# Patient Record
Sex: Female | Born: 1935
Health system: Southern US, Community
[De-identification: ages and names within clinical notes are randomized; demographics above are authoritative.]

## PROBLEM LIST (undated history)

## (undated) DIAGNOSIS — E7439 Other disorders of intestinal carbohydrate absorption: Secondary | ICD-10-CM

## (undated) DIAGNOSIS — Z955 Presence of coronary angioplasty implant and graft: Secondary | ICD-10-CM

## (undated) DIAGNOSIS — E538 Deficiency of other specified B group vitamins: Secondary | ICD-10-CM

## (undated) DIAGNOSIS — I350 Nonrheumatic aortic (valve) stenosis: Secondary | ICD-10-CM

## (undated) DIAGNOSIS — I219 Acute myocardial infarction, unspecified: Secondary | ICD-10-CM

## (undated) DIAGNOSIS — D649 Anemia, unspecified: Secondary | ICD-10-CM

## (undated) DIAGNOSIS — F418 Other specified anxiety disorders: Secondary | ICD-10-CM

## (undated) DIAGNOSIS — I10 Essential (primary) hypertension: Secondary | ICD-10-CM

## (undated) DIAGNOSIS — I639 Cerebral infarction, unspecified: Secondary | ICD-10-CM

## (undated) DIAGNOSIS — K635 Polyp of colon: Secondary | ICD-10-CM

## (undated) DIAGNOSIS — C569 Malignant neoplasm of unspecified ovary: Secondary | ICD-10-CM

## (undated) DIAGNOSIS — M810 Age-related osteoporosis without current pathological fracture: Secondary | ICD-10-CM

## (undated) DIAGNOSIS — I509 Heart failure, unspecified: Secondary | ICD-10-CM

## (undated) DIAGNOSIS — I4891 Unspecified atrial fibrillation: Secondary | ICD-10-CM

## (undated) DIAGNOSIS — Z8709 Personal history of other diseases of the respiratory system: Secondary | ICD-10-CM

## (undated) DIAGNOSIS — F0390 Unspecified dementia without behavioral disturbance: Secondary | ICD-10-CM

## (undated) DIAGNOSIS — G2581 Restless legs syndrome: Secondary | ICD-10-CM

## (undated) HISTORY — DX: Polyp of colon: K63.5

## (undated) HISTORY — PX: BREAST SURGERY: SHX581

## (undated) HISTORY — DX: Cerebral infarction, unspecified: I63.9

## (undated) HISTORY — DX: Malignant neoplasm of unspecified ovary: C56.9

## (undated) HISTORY — PX: CORONARY STENT PLACEMENT: SHX1402

## (undated) HISTORY — DX: Restless legs syndrome: G25.81

## (undated) HISTORY — DX: Essential (primary) hypertension: I10

## (undated) HISTORY — PX: JOINT REPLACEMENT: SHX530

## (undated) HISTORY — DX: Heart failure, unspecified: I50.9

## (undated) HISTORY — DX: Other disorders of intestinal carbohydrate absorption: E74.39

## (undated) HISTORY — DX: Nonrheumatic aortic (valve) stenosis: I35.0

## (undated) HISTORY — DX: Personal history of other diseases of the respiratory system: Z87.09

## (undated) HISTORY — DX: Deficiency of other specified B group vitamins: E53.8

## (undated) HISTORY — PX: EYE SURGERY: SHX253

## (undated) HISTORY — DX: Presence of coronary angioplasty implant and graft: Z95.5

## (undated) HISTORY — PX: BACK SURGERY: SHX140

## (undated) HISTORY — PX: ABDOMINAL HYSTERECTOMY: SHX81

## (undated) HISTORY — PX: HEMORROIDECTOMY: SUR656

## (undated) HISTORY — DX: Age-related osteoporosis without current pathological fracture: M81.0

## (undated) HISTORY — DX: Acute myocardial infarction, unspecified: I21.9

## (undated) HISTORY — PX: CHOLECYSTECTOMY: SHX55

## (undated) HISTORY — DX: Other specified anxiety disorders: F41.8

---

## 2013-09-30 DIAGNOSIS — K635 Polyp of colon: Secondary | ICD-10-CM

## 2013-09-30 HISTORY — DX: Polyp of colon: K63.5

## 2013-10-06 LAB — HM COLONOSCOPY

## 2015-07-01 LAB — HEMOGLOBIN A1C: Hemoglobin A1C: 5.8

## 2015-07-01 LAB — LIPID PANEL
CHOLESTEROL: 227 — AB (ref 0–200)
HDL: 49 (ref 35–70)
LDL CALC: 150
Triglycerides: 139 (ref 40–160)

## 2016-04-20 LAB — HM DEXA SCAN

## 2016-07-21 LAB — IRON,TIBC AND FERRITIN PANEL
Ferritin: 52
IRON: 76
TIBC: 307

## 2016-07-21 LAB — CBC AND DIFFERENTIAL
HEMATOCRIT: 29 — AB (ref 36–46)
HEMOGLOBIN: 9.9 — AB (ref 12.0–16.0)
PLATELETS: 165 (ref 150–399)

## 2016-07-21 LAB — BASIC METABOLIC PANEL
BUN: 19 (ref 4–21)
Creatinine: 1.3 — AB (ref 0.5–1.1)
GLUCOSE: 80
Potassium: 4 (ref 3.4–5.3)
Sodium: 137 (ref 137–147)

## 2016-11-01 DIAGNOSIS — G309 Alzheimer's disease, unspecified: Secondary | ICD-10-CM | POA: Diagnosis not present

## 2016-11-01 DIAGNOSIS — I482 Chronic atrial fibrillation: Secondary | ICD-10-CM | POA: Diagnosis not present

## 2016-11-01 DIAGNOSIS — I10 Essential (primary) hypertension: Secondary | ICD-10-CM | POA: Diagnosis not present

## 2016-11-21 DIAGNOSIS — I482 Chronic atrial fibrillation: Secondary | ICD-10-CM | POA: Diagnosis not present

## 2016-11-21 DIAGNOSIS — Z7901 Long term (current) use of anticoagulants: Secondary | ICD-10-CM | POA: Diagnosis not present

## 2016-12-01 DIAGNOSIS — G309 Alzheimer's disease, unspecified: Secondary | ICD-10-CM | POA: Diagnosis not present

## 2016-12-01 DIAGNOSIS — I10 Essential (primary) hypertension: Secondary | ICD-10-CM | POA: Diagnosis not present

## 2016-12-01 DIAGNOSIS — I482 Chronic atrial fibrillation: Secondary | ICD-10-CM | POA: Diagnosis not present

## 2016-12-26 DIAGNOSIS — I482 Chronic atrial fibrillation: Secondary | ICD-10-CM | POA: Diagnosis not present

## 2016-12-26 DIAGNOSIS — Z7901 Long term (current) use of anticoagulants: Secondary | ICD-10-CM | POA: Diagnosis not present

## 2017-01-04 DIAGNOSIS — I482 Chronic atrial fibrillation: Secondary | ICD-10-CM | POA: Diagnosis not present

## 2017-01-04 DIAGNOSIS — I10 Essential (primary) hypertension: Secondary | ICD-10-CM | POA: Diagnosis not present

## 2017-01-04 DIAGNOSIS — G309 Alzheimer's disease, unspecified: Secondary | ICD-10-CM | POA: Diagnosis not present

## 2017-01-25 DIAGNOSIS — M5137 Other intervertebral disc degeneration, lumbosacral region: Secondary | ICD-10-CM | POA: Diagnosis not present

## 2017-01-25 DIAGNOSIS — K219 Gastro-esophageal reflux disease without esophagitis: Secondary | ICD-10-CM | POA: Diagnosis not present

## 2017-01-25 DIAGNOSIS — Z7901 Long term (current) use of anticoagulants: Secondary | ICD-10-CM | POA: Diagnosis not present

## 2017-01-25 DIAGNOSIS — F321 Major depressive disorder, single episode, moderate: Secondary | ICD-10-CM | POA: Diagnosis not present

## 2017-01-25 DIAGNOSIS — I10 Essential (primary) hypertension: Secondary | ICD-10-CM | POA: Diagnosis not present

## 2017-01-25 DIAGNOSIS — G2581 Restless legs syndrome: Secondary | ICD-10-CM | POA: Diagnosis not present

## 2017-02-23 DIAGNOSIS — I482 Chronic atrial fibrillation: Secondary | ICD-10-CM | POA: Diagnosis not present

## 2017-02-23 DIAGNOSIS — F321 Major depressive disorder, single episode, moderate: Secondary | ICD-10-CM | POA: Diagnosis not present

## 2017-02-23 DIAGNOSIS — Z7901 Long term (current) use of anticoagulants: Secondary | ICD-10-CM | POA: Diagnosis not present

## 2017-02-28 DIAGNOSIS — I482 Chronic atrial fibrillation: Secondary | ICD-10-CM | POA: Diagnosis not present

## 2017-02-28 DIAGNOSIS — F321 Major depressive disorder, single episode, moderate: Secondary | ICD-10-CM | POA: Diagnosis not present

## 2017-02-28 DIAGNOSIS — G309 Alzheimer's disease, unspecified: Secondary | ICD-10-CM | POA: Diagnosis not present

## 2017-02-28 DIAGNOSIS — I10 Essential (primary) hypertension: Secondary | ICD-10-CM | POA: Diagnosis not present

## 2017-03-09 DIAGNOSIS — I482 Chronic atrial fibrillation: Secondary | ICD-10-CM | POA: Diagnosis not present

## 2017-03-09 DIAGNOSIS — Z7901 Long term (current) use of anticoagulants: Secondary | ICD-10-CM | POA: Diagnosis not present

## 2017-03-28 DIAGNOSIS — Z7901 Long term (current) use of anticoagulants: Secondary | ICD-10-CM | POA: Diagnosis not present

## 2017-03-28 DIAGNOSIS — I482 Chronic atrial fibrillation: Secondary | ICD-10-CM | POA: Diagnosis not present

## 2017-04-04 DIAGNOSIS — G2581 Restless legs syndrome: Secondary | ICD-10-CM | POA: Diagnosis not present

## 2017-04-04 DIAGNOSIS — M79604 Pain in right leg: Secondary | ICD-10-CM | POA: Diagnosis not present

## 2017-04-04 DIAGNOSIS — F321 Major depressive disorder, single episode, moderate: Secondary | ICD-10-CM | POA: Diagnosis not present

## 2017-04-04 DIAGNOSIS — R358 Other polyuria: Secondary | ICD-10-CM | POA: Diagnosis not present

## 2017-04-04 DIAGNOSIS — M79605 Pain in left leg: Secondary | ICD-10-CM | POA: Diagnosis not present

## 2017-04-04 DIAGNOSIS — N3000 Acute cystitis without hematuria: Secondary | ICD-10-CM | POA: Diagnosis not present

## 2017-04-05 DIAGNOSIS — I10 Essential (primary) hypertension: Secondary | ICD-10-CM | POA: Diagnosis not present

## 2017-04-05 DIAGNOSIS — G309 Alzheimer's disease, unspecified: Secondary | ICD-10-CM | POA: Diagnosis not present

## 2017-04-05 DIAGNOSIS — F321 Major depressive disorder, single episode, moderate: Secondary | ICD-10-CM | POA: Diagnosis not present

## 2017-04-05 DIAGNOSIS — I482 Chronic atrial fibrillation: Secondary | ICD-10-CM | POA: Diagnosis not present

## 2017-04-12 DIAGNOSIS — I482 Chronic atrial fibrillation: Secondary | ICD-10-CM | POA: Diagnosis not present

## 2017-04-12 DIAGNOSIS — Z7901 Long term (current) use of anticoagulants: Secondary | ICD-10-CM | POA: Diagnosis not present

## 2017-04-26 DIAGNOSIS — I482 Chronic atrial fibrillation: Secondary | ICD-10-CM | POA: Diagnosis not present

## 2017-04-26 DIAGNOSIS — Z7901 Long term (current) use of anticoagulants: Secondary | ICD-10-CM | POA: Diagnosis not present

## 2017-04-28 DIAGNOSIS — M79661 Pain in right lower leg: Secondary | ICD-10-CM | POA: Diagnosis not present

## 2017-04-28 DIAGNOSIS — M79662 Pain in left lower leg: Secondary | ICD-10-CM | POA: Diagnosis not present

## 2017-05-02 DIAGNOSIS — F321 Major depressive disorder, single episode, moderate: Secondary | ICD-10-CM | POA: Diagnosis not present

## 2017-05-02 DIAGNOSIS — G309 Alzheimer's disease, unspecified: Secondary | ICD-10-CM | POA: Diagnosis not present

## 2017-05-02 DIAGNOSIS — I10 Essential (primary) hypertension: Secondary | ICD-10-CM | POA: Diagnosis not present

## 2017-05-02 DIAGNOSIS — I482 Chronic atrial fibrillation: Secondary | ICD-10-CM | POA: Diagnosis not present

## 2017-05-31 DIAGNOSIS — I10 Essential (primary) hypertension: Secondary | ICD-10-CM | POA: Diagnosis not present

## 2017-05-31 DIAGNOSIS — N3281 Overactive bladder: Secondary | ICD-10-CM | POA: Diagnosis not present

## 2017-05-31 DIAGNOSIS — G4700 Insomnia, unspecified: Secondary | ICD-10-CM | POA: Diagnosis not present

## 2017-05-31 DIAGNOSIS — F0390 Unspecified dementia without behavioral disturbance: Secondary | ICD-10-CM | POA: Diagnosis not present

## 2017-05-31 DIAGNOSIS — Z7901 Long term (current) use of anticoagulants: Secondary | ICD-10-CM | POA: Diagnosis not present

## 2017-05-31 DIAGNOSIS — D126 Benign neoplasm of colon, unspecified: Secondary | ICD-10-CM | POA: Diagnosis not present

## 2017-05-31 DIAGNOSIS — F331 Major depressive disorder, recurrent, moderate: Secondary | ICD-10-CM | POA: Diagnosis not present

## 2017-05-31 DIAGNOSIS — Z0000 Encounter for general adult medical examination without abnormal findings: Secondary | ICD-10-CM | POA: Diagnosis not present

## 2017-05-31 DIAGNOSIS — I482 Chronic atrial fibrillation: Secondary | ICD-10-CM | POA: Diagnosis not present

## 2017-05-31 DIAGNOSIS — D649 Anemia, unspecified: Secondary | ICD-10-CM | POA: Diagnosis not present

## 2017-05-31 DIAGNOSIS — M1990 Unspecified osteoarthritis, unspecified site: Secondary | ICD-10-CM | POA: Diagnosis not present

## 2017-05-31 DIAGNOSIS — E785 Hyperlipidemia, unspecified: Secondary | ICD-10-CM | POA: Diagnosis not present

## 2017-05-31 DIAGNOSIS — J301 Allergic rhinitis due to pollen: Secondary | ICD-10-CM | POA: Diagnosis not present

## 2017-05-31 DIAGNOSIS — F411 Generalized anxiety disorder: Secondary | ICD-10-CM | POA: Diagnosis not present

## 2017-06-01 DIAGNOSIS — F331 Major depressive disorder, recurrent, moderate: Secondary | ICD-10-CM | POA: Diagnosis not present

## 2017-06-01 DIAGNOSIS — I482 Chronic atrial fibrillation: Secondary | ICD-10-CM | POA: Diagnosis not present

## 2017-06-01 DIAGNOSIS — D126 Benign neoplasm of colon, unspecified: Secondary | ICD-10-CM | POA: Diagnosis not present

## 2017-06-01 DIAGNOSIS — J301 Allergic rhinitis due to pollen: Secondary | ICD-10-CM | POA: Diagnosis not present

## 2017-06-01 DIAGNOSIS — M1990 Unspecified osteoarthritis, unspecified site: Secondary | ICD-10-CM | POA: Diagnosis not present

## 2017-06-01 DIAGNOSIS — N3281 Overactive bladder: Secondary | ICD-10-CM | POA: Diagnosis not present

## 2017-06-01 DIAGNOSIS — G4700 Insomnia, unspecified: Secondary | ICD-10-CM | POA: Diagnosis not present

## 2017-06-01 DIAGNOSIS — I10 Essential (primary) hypertension: Secondary | ICD-10-CM | POA: Diagnosis not present

## 2017-06-01 DIAGNOSIS — D649 Anemia, unspecified: Secondary | ICD-10-CM | POA: Diagnosis not present

## 2017-06-01 DIAGNOSIS — F411 Generalized anxiety disorder: Secondary | ICD-10-CM | POA: Diagnosis not present

## 2017-06-01 DIAGNOSIS — F0390 Unspecified dementia without behavioral disturbance: Secondary | ICD-10-CM | POA: Diagnosis not present

## 2017-06-01 DIAGNOSIS — E785 Hyperlipidemia, unspecified: Secondary | ICD-10-CM | POA: Diagnosis not present

## 2017-06-02 DIAGNOSIS — I10 Essential (primary) hypertension: Secondary | ICD-10-CM | POA: Diagnosis not present

## 2017-06-02 DIAGNOSIS — Z0000 Encounter for general adult medical examination without abnormal findings: Secondary | ICD-10-CM | POA: Diagnosis not present

## 2017-06-02 DIAGNOSIS — D649 Anemia, unspecified: Secondary | ICD-10-CM | POA: Diagnosis not present

## 2017-06-02 DIAGNOSIS — E785 Hyperlipidemia, unspecified: Secondary | ICD-10-CM | POA: Diagnosis not present

## 2017-06-23 DIAGNOSIS — H44112 Panuveitis, left eye: Secondary | ICD-10-CM | POA: Diagnosis not present

## 2017-06-26 DIAGNOSIS — E113212 Type 2 diabetes mellitus with mild nonproliferative diabetic retinopathy with macular edema, left eye: Secondary | ICD-10-CM | POA: Diagnosis not present

## 2017-06-26 DIAGNOSIS — H209 Unspecified iridocyclitis: Secondary | ICD-10-CM | POA: Diagnosis not present

## 2017-06-26 DIAGNOSIS — E113291 Type 2 diabetes mellitus with mild nonproliferative diabetic retinopathy without macular edema, right eye: Secondary | ICD-10-CM | POA: Diagnosis not present

## 2017-06-27 DIAGNOSIS — I10 Essential (primary) hypertension: Secondary | ICD-10-CM | POA: Diagnosis not present

## 2017-06-27 DIAGNOSIS — M810 Age-related osteoporosis without current pathological fracture: Secondary | ICD-10-CM | POA: Diagnosis not present

## 2017-06-27 DIAGNOSIS — F0390 Unspecified dementia without behavioral disturbance: Secondary | ICD-10-CM | POA: Diagnosis not present

## 2017-06-27 DIAGNOSIS — I482 Chronic atrial fibrillation: Secondary | ICD-10-CM | POA: Diagnosis not present

## 2017-06-27 DIAGNOSIS — Z7901 Long term (current) use of anticoagulants: Secondary | ICD-10-CM | POA: Diagnosis not present

## 2017-06-27 DIAGNOSIS — D649 Anemia, unspecified: Secondary | ICD-10-CM | POA: Diagnosis not present

## 2017-06-27 DIAGNOSIS — G2581 Restless legs syndrome: Secondary | ICD-10-CM | POA: Diagnosis not present

## 2017-06-27 DIAGNOSIS — F331 Major depressive disorder, recurrent, moderate: Secondary | ICD-10-CM | POA: Diagnosis not present

## 2017-06-27 DIAGNOSIS — E538 Deficiency of other specified B group vitamins: Secondary | ICD-10-CM | POA: Diagnosis not present

## 2017-06-27 DIAGNOSIS — G629 Polyneuropathy, unspecified: Secondary | ICD-10-CM | POA: Diagnosis not present

## 2017-07-05 DIAGNOSIS — M810 Age-related osteoporosis without current pathological fracture: Secondary | ICD-10-CM | POA: Diagnosis not present

## 2017-07-05 DIAGNOSIS — E538 Deficiency of other specified B group vitamins: Secondary | ICD-10-CM | POA: Diagnosis not present

## 2017-07-05 DIAGNOSIS — I482 Chronic atrial fibrillation: Secondary | ICD-10-CM | POA: Diagnosis not present

## 2017-07-11 DIAGNOSIS — E113212 Type 2 diabetes mellitus with mild nonproliferative diabetic retinopathy with macular edema, left eye: Secondary | ICD-10-CM | POA: Diagnosis not present

## 2017-07-11 DIAGNOSIS — E113291 Type 2 diabetes mellitus with mild nonproliferative diabetic retinopathy without macular edema, right eye: Secondary | ICD-10-CM | POA: Diagnosis not present

## 2017-07-11 DIAGNOSIS — H209 Unspecified iridocyclitis: Secondary | ICD-10-CM | POA: Diagnosis not present

## 2017-07-11 LAB — HM DIABETES EYE EXAM

## 2017-07-13 DIAGNOSIS — E538 Deficiency of other specified B group vitamins: Secondary | ICD-10-CM | POA: Diagnosis not present

## 2017-07-19 DIAGNOSIS — E538 Deficiency of other specified B group vitamins: Secondary | ICD-10-CM | POA: Diagnosis not present

## 2017-07-27 DIAGNOSIS — E538 Deficiency of other specified B group vitamins: Secondary | ICD-10-CM | POA: Diagnosis not present

## 2017-07-29 DIAGNOSIS — E785 Hyperlipidemia, unspecified: Secondary | ICD-10-CM | POA: Diagnosis not present

## 2017-07-29 DIAGNOSIS — M879 Osteonecrosis, unspecified: Secondary | ICD-10-CM | POA: Diagnosis not present

## 2017-07-29 DIAGNOSIS — Z880 Allergy status to penicillin: Secondary | ICD-10-CM | POA: Diagnosis not present

## 2017-07-29 DIAGNOSIS — Z881 Allergy status to other antibiotic agents status: Secondary | ICD-10-CM | POA: Diagnosis not present

## 2017-07-29 DIAGNOSIS — M7989 Other specified soft tissue disorders: Secondary | ICD-10-CM | POA: Diagnosis not present

## 2017-07-29 DIAGNOSIS — Z888 Allergy status to other drugs, medicaments and biological substances status: Secondary | ICD-10-CM | POA: Diagnosis not present

## 2017-07-29 DIAGNOSIS — E119 Type 2 diabetes mellitus without complications: Secondary | ICD-10-CM | POA: Diagnosis not present

## 2017-07-29 DIAGNOSIS — M02331 Reiter's disease, right wrist: Secondary | ICD-10-CM | POA: Diagnosis not present

## 2017-07-29 DIAGNOSIS — Z79891 Long term (current) use of opiate analgesic: Secondary | ICD-10-CM | POA: Diagnosis not present

## 2017-07-29 DIAGNOSIS — M0230 Reiter's disease, unspecified site: Secondary | ICD-10-CM | POA: Diagnosis not present

## 2017-07-29 DIAGNOSIS — I4891 Unspecified atrial fibrillation: Secondary | ICD-10-CM | POA: Diagnosis not present

## 2017-07-29 DIAGNOSIS — M02341 Reiter's disease, right hand: Secondary | ICD-10-CM | POA: Diagnosis not present

## 2017-07-29 DIAGNOSIS — Z7952 Long term (current) use of systemic steroids: Secondary | ICD-10-CM | POA: Diagnosis not present

## 2017-07-29 DIAGNOSIS — Z7901 Long term (current) use of anticoagulants: Secondary | ICD-10-CM | POA: Diagnosis not present

## 2017-07-29 DIAGNOSIS — Z882 Allergy status to sulfonamides status: Secondary | ICD-10-CM | POA: Diagnosis not present

## 2017-07-29 DIAGNOSIS — I10 Essential (primary) hypertension: Secondary | ICD-10-CM | POA: Diagnosis not present

## 2017-08-01 DIAGNOSIS — I482 Chronic atrial fibrillation: Secondary | ICD-10-CM | POA: Diagnosis not present

## 2017-08-17 DIAGNOSIS — I482 Chronic atrial fibrillation: Secondary | ICD-10-CM | POA: Diagnosis not present

## 2017-08-23 DIAGNOSIS — E538 Deficiency of other specified B group vitamins: Secondary | ICD-10-CM | POA: Diagnosis not present

## 2017-09-04 DIAGNOSIS — Z1231 Encounter for screening mammogram for malignant neoplasm of breast: Secondary | ICD-10-CM | POA: Diagnosis not present

## 2017-09-04 LAB — HM MAMMOGRAPHY

## 2017-09-19 DIAGNOSIS — D649 Anemia, unspecified: Secondary | ICD-10-CM | POA: Diagnosis not present

## 2017-09-19 DIAGNOSIS — I482 Chronic atrial fibrillation: Secondary | ICD-10-CM | POA: Diagnosis not present

## 2017-09-19 DIAGNOSIS — Z0000 Encounter for general adult medical examination without abnormal findings: Secondary | ICD-10-CM | POA: Diagnosis not present

## 2017-09-19 DIAGNOSIS — Q667 Congenital pes cavus: Secondary | ICD-10-CM | POA: Diagnosis not present

## 2017-09-19 DIAGNOSIS — Z7901 Long term (current) use of anticoagulants: Secondary | ICD-10-CM | POA: Diagnosis not present

## 2017-09-19 DIAGNOSIS — E538 Deficiency of other specified B group vitamins: Secondary | ICD-10-CM | POA: Diagnosis not present

## 2017-09-19 DIAGNOSIS — I10 Essential (primary) hypertension: Secondary | ICD-10-CM | POA: Diagnosis not present

## 2017-09-19 DIAGNOSIS — R260 Ataxic gait: Secondary | ICD-10-CM | POA: Diagnosis not present

## 2017-09-19 DIAGNOSIS — M1990 Unspecified osteoarthritis, unspecified site: Secondary | ICD-10-CM | POA: Diagnosis not present

## 2017-09-19 DIAGNOSIS — Z9181 History of falling: Secondary | ICD-10-CM | POA: Diagnosis not present

## 2017-09-30 DIAGNOSIS — Z955 Presence of coronary angioplasty implant and graft: Secondary | ICD-10-CM

## 2017-09-30 HISTORY — DX: Presence of coronary angioplasty implant and graft: Z95.5

## 2017-10-01 DIAGNOSIS — R079 Chest pain, unspecified: Secondary | ICD-10-CM | POA: Diagnosis not present

## 2017-10-01 DIAGNOSIS — F0390 Unspecified dementia without behavioral disturbance: Secondary | ICD-10-CM | POA: Diagnosis not present

## 2017-10-01 DIAGNOSIS — G4733 Obstructive sleep apnea (adult) (pediatric): Secondary | ICD-10-CM | POA: Diagnosis not present

## 2017-10-01 DIAGNOSIS — R0602 Shortness of breath: Secondary | ICD-10-CM | POA: Diagnosis not present

## 2017-10-01 DIAGNOSIS — R072 Precordial pain: Secondary | ICD-10-CM | POA: Diagnosis not present

## 2017-10-01 DIAGNOSIS — I10 Essential (primary) hypertension: Secondary | ICD-10-CM | POA: Diagnosis not present

## 2017-10-01 DIAGNOSIS — D649 Anemia, unspecified: Secondary | ICD-10-CM | POA: Diagnosis not present

## 2017-10-02 DIAGNOSIS — R079 Chest pain, unspecified: Secondary | ICD-10-CM | POA: Diagnosis not present

## 2017-10-02 DIAGNOSIS — I10 Essential (primary) hypertension: Secondary | ICD-10-CM | POA: Diagnosis not present

## 2017-10-02 DIAGNOSIS — I083 Combined rheumatic disorders of mitral, aortic and tricuspid valves: Secondary | ICD-10-CM | POA: Diagnosis not present

## 2017-10-02 DIAGNOSIS — G4733 Obstructive sleep apnea (adult) (pediatric): Secondary | ICD-10-CM | POA: Diagnosis not present

## 2017-10-02 DIAGNOSIS — I517 Cardiomegaly: Secondary | ICD-10-CM | POA: Diagnosis not present

## 2017-10-02 DIAGNOSIS — F0390 Unspecified dementia without behavioral disturbance: Secondary | ICD-10-CM | POA: Diagnosis not present

## 2017-10-02 DIAGNOSIS — I272 Pulmonary hypertension, unspecified: Secondary | ICD-10-CM | POA: Diagnosis not present

## 2017-10-02 DIAGNOSIS — R9439 Abnormal result of other cardiovascular function study: Secondary | ICD-10-CM | POA: Diagnosis not present

## 2017-10-03 DIAGNOSIS — I480 Paroxysmal atrial fibrillation: Secondary | ICD-10-CM | POA: Diagnosis not present

## 2017-10-03 DIAGNOSIS — E785 Hyperlipidemia, unspecified: Secondary | ICD-10-CM | POA: Diagnosis not present

## 2017-10-03 DIAGNOSIS — G4700 Insomnia, unspecified: Secondary | ICD-10-CM | POA: Diagnosis not present

## 2017-10-03 DIAGNOSIS — Z7901 Long term (current) use of anticoagulants: Secondary | ICD-10-CM | POA: Diagnosis not present

## 2017-10-03 DIAGNOSIS — I350 Nonrheumatic aortic (valve) stenosis: Secondary | ICD-10-CM | POA: Diagnosis not present

## 2017-10-03 DIAGNOSIS — R079 Chest pain, unspecified: Secondary | ICD-10-CM | POA: Diagnosis not present

## 2017-10-03 DIAGNOSIS — G4733 Obstructive sleep apnea (adult) (pediatric): Secondary | ICD-10-CM | POA: Diagnosis not present

## 2017-10-03 DIAGNOSIS — F0390 Unspecified dementia without behavioral disturbance: Secondary | ICD-10-CM | POA: Diagnosis not present

## 2017-10-03 DIAGNOSIS — I10 Essential (primary) hypertension: Secondary | ICD-10-CM | POA: Diagnosis not present

## 2017-10-03 DIAGNOSIS — I2510 Atherosclerotic heart disease of native coronary artery without angina pectoris: Secondary | ICD-10-CM | POA: Diagnosis not present

## 2017-10-04 DIAGNOSIS — Z96651 Presence of right artificial knee joint: Secondary | ICD-10-CM | POA: Diagnosis present

## 2017-10-04 DIAGNOSIS — I350 Nonrheumatic aortic (valve) stenosis: Secondary | ICD-10-CM | POA: Diagnosis present

## 2017-10-04 DIAGNOSIS — Z888 Allergy status to other drugs, medicaments and biological substances status: Secondary | ICD-10-CM | POA: Diagnosis not present

## 2017-10-04 DIAGNOSIS — I10 Essential (primary) hypertension: Secondary | ICD-10-CM | POA: Diagnosis present

## 2017-10-04 DIAGNOSIS — Z881 Allergy status to other antibiotic agents status: Secondary | ICD-10-CM | POA: Diagnosis not present

## 2017-10-04 DIAGNOSIS — F0390 Unspecified dementia without behavioral disturbance: Secondary | ICD-10-CM | POA: Diagnosis present

## 2017-10-04 DIAGNOSIS — N189 Chronic kidney disease, unspecified: Secondary | ICD-10-CM | POA: Diagnosis present

## 2017-10-04 DIAGNOSIS — E1122 Type 2 diabetes mellitus with diabetic chronic kidney disease: Secondary | ICD-10-CM | POA: Diagnosis present

## 2017-10-04 DIAGNOSIS — G629 Polyneuropathy, unspecified: Secondary | ICD-10-CM | POA: Diagnosis present

## 2017-10-04 DIAGNOSIS — G4733 Obstructive sleep apnea (adult) (pediatric): Secondary | ICD-10-CM | POA: Diagnosis present

## 2017-10-04 DIAGNOSIS — Z880 Allergy status to penicillin: Secondary | ICD-10-CM | POA: Diagnosis not present

## 2017-10-04 DIAGNOSIS — G2581 Restless legs syndrome: Secondary | ICD-10-CM | POA: Diagnosis present

## 2017-10-04 DIAGNOSIS — Z7901 Long term (current) use of anticoagulants: Secondary | ICD-10-CM | POA: Diagnosis not present

## 2017-10-04 DIAGNOSIS — I444 Left anterior fascicular block: Secondary | ICD-10-CM | POA: Diagnosis not present

## 2017-10-04 DIAGNOSIS — Z79899 Other long term (current) drug therapy: Secondary | ICD-10-CM | POA: Diagnosis not present

## 2017-10-04 DIAGNOSIS — R9439 Abnormal result of other cardiovascular function study: Secondary | ICD-10-CM | POA: Diagnosis present

## 2017-10-04 DIAGNOSIS — E875 Hyperkalemia: Secondary | ICD-10-CM | POA: Diagnosis not present

## 2017-10-04 DIAGNOSIS — Z96611 Presence of right artificial shoulder joint: Secondary | ICD-10-CM | POA: Diagnosis present

## 2017-10-04 DIAGNOSIS — R079 Chest pain, unspecified: Secondary | ICD-10-CM | POA: Diagnosis not present

## 2017-10-04 DIAGNOSIS — I2510 Atherosclerotic heart disease of native coronary artery without angina pectoris: Secondary | ICD-10-CM | POA: Diagnosis present

## 2017-10-04 DIAGNOSIS — Z86010 Personal history of colonic polyps: Secondary | ICD-10-CM | POA: Diagnosis not present

## 2017-10-04 DIAGNOSIS — E785 Hyperlipidemia, unspecified: Secondary | ICD-10-CM | POA: Diagnosis present

## 2017-10-04 DIAGNOSIS — Z8543 Personal history of malignant neoplasm of ovary: Secondary | ICD-10-CM | POA: Diagnosis not present

## 2017-10-04 DIAGNOSIS — E871 Hypo-osmolality and hyponatremia: Secondary | ICD-10-CM | POA: Diagnosis not present

## 2017-10-04 DIAGNOSIS — I480 Paroxysmal atrial fibrillation: Secondary | ICD-10-CM | POA: Diagnosis present

## 2017-10-04 DIAGNOSIS — I129 Hypertensive chronic kidney disease with stage 1 through stage 4 chronic kidney disease, or unspecified chronic kidney disease: Secondary | ICD-10-CM | POA: Diagnosis present

## 2017-10-04 DIAGNOSIS — E1142 Type 2 diabetes mellitus with diabetic polyneuropathy: Secondary | ICD-10-CM | POA: Diagnosis present

## 2017-10-04 DIAGNOSIS — I440 Atrioventricular block, first degree: Secondary | ICD-10-CM | POA: Diagnosis not present

## 2017-10-11 DIAGNOSIS — Z7901 Long term (current) use of anticoagulants: Secondary | ICD-10-CM | POA: Diagnosis not present

## 2017-10-11 DIAGNOSIS — E785 Hyperlipidemia, unspecified: Secondary | ICD-10-CM | POA: Diagnosis not present

## 2017-10-11 DIAGNOSIS — D649 Anemia, unspecified: Secondary | ICD-10-CM | POA: Diagnosis not present

## 2017-10-11 DIAGNOSIS — I350 Nonrheumatic aortic (valve) stenosis: Secondary | ICD-10-CM | POA: Diagnosis not present

## 2017-10-11 DIAGNOSIS — I2510 Atherosclerotic heart disease of native coronary artery without angina pectoris: Secondary | ICD-10-CM | POA: Diagnosis not present

## 2017-10-11 DIAGNOSIS — G2581 Restless legs syndrome: Secondary | ICD-10-CM | POA: Diagnosis not present

## 2017-10-11 DIAGNOSIS — Z95818 Presence of other cardiac implants and grafts: Secondary | ICD-10-CM | POA: Diagnosis not present

## 2017-10-11 DIAGNOSIS — F0390 Unspecified dementia without behavioral disturbance: Secondary | ICD-10-CM | POA: Diagnosis not present

## 2017-10-11 DIAGNOSIS — I10 Essential (primary) hypertension: Secondary | ICD-10-CM | POA: Diagnosis not present

## 2017-10-11 DIAGNOSIS — I482 Chronic atrial fibrillation: Secondary | ICD-10-CM | POA: Diagnosis not present

## 2017-10-13 DIAGNOSIS — I10 Essential (primary) hypertension: Secondary | ICD-10-CM | POA: Diagnosis not present

## 2017-10-13 DIAGNOSIS — I480 Paroxysmal atrial fibrillation: Secondary | ICD-10-CM | POA: Diagnosis not present

## 2017-10-13 DIAGNOSIS — I2510 Atherosclerotic heart disease of native coronary artery without angina pectoris: Secondary | ICD-10-CM | POA: Diagnosis not present

## 2017-10-13 DIAGNOSIS — I350 Nonrheumatic aortic (valve) stenosis: Secondary | ICD-10-CM | POA: Diagnosis not present

## 2017-10-13 DIAGNOSIS — Z9861 Coronary angioplasty status: Secondary | ICD-10-CM | POA: Diagnosis not present

## 2017-10-25 DIAGNOSIS — I2510 Atherosclerotic heart disease of native coronary artery without angina pectoris: Secondary | ICD-10-CM | POA: Diagnosis not present

## 2017-10-25 DIAGNOSIS — Z7901 Long term (current) use of anticoagulants: Secondary | ICD-10-CM | POA: Diagnosis not present

## 2017-10-25 DIAGNOSIS — Z888 Allergy status to other drugs, medicaments and biological substances status: Secondary | ICD-10-CM | POA: Diagnosis not present

## 2017-10-25 DIAGNOSIS — Z7902 Long term (current) use of antithrombotics/antiplatelets: Secondary | ICD-10-CM | POA: Diagnosis not present

## 2017-10-25 DIAGNOSIS — E119 Type 2 diabetes mellitus without complications: Secondary | ICD-10-CM | POA: Diagnosis not present

## 2017-10-25 DIAGNOSIS — R0602 Shortness of breath: Secondary | ICD-10-CM | POA: Diagnosis not present

## 2017-10-25 DIAGNOSIS — I4891 Unspecified atrial fibrillation: Secondary | ICD-10-CM | POA: Diagnosis not present

## 2017-10-25 DIAGNOSIS — Z880 Allergy status to penicillin: Secondary | ICD-10-CM | POA: Diagnosis not present

## 2017-10-25 DIAGNOSIS — N289 Disorder of kidney and ureter, unspecified: Secondary | ICD-10-CM | POA: Diagnosis not present

## 2017-10-25 DIAGNOSIS — R079 Chest pain, unspecified: Secondary | ICD-10-CM | POA: Diagnosis not present

## 2017-10-25 DIAGNOSIS — Z79899 Other long term (current) drug therapy: Secondary | ICD-10-CM | POA: Diagnosis not present

## 2017-10-25 DIAGNOSIS — M25512 Pain in left shoulder: Secondary | ICD-10-CM | POA: Diagnosis not present

## 2017-10-25 DIAGNOSIS — D649 Anemia, unspecified: Secondary | ICD-10-CM | POA: Diagnosis not present

## 2017-10-25 DIAGNOSIS — R0789 Other chest pain: Secondary | ICD-10-CM | POA: Diagnosis not present

## 2017-10-25 DIAGNOSIS — T45515A Adverse effect of anticoagulants, initial encounter: Secondary | ICD-10-CM | POA: Diagnosis not present

## 2017-10-25 DIAGNOSIS — Z881 Allergy status to other antibiotic agents status: Secondary | ICD-10-CM | POA: Diagnosis not present

## 2017-10-25 DIAGNOSIS — D6832 Hemorrhagic disorder due to extrinsic circulating anticoagulants: Secondary | ICD-10-CM | POA: Diagnosis not present

## 2017-10-25 DIAGNOSIS — I10 Essential (primary) hypertension: Secondary | ICD-10-CM | POA: Diagnosis not present

## 2017-10-25 DIAGNOSIS — E785 Hyperlipidemia, unspecified: Secondary | ICD-10-CM | POA: Diagnosis not present

## 2017-10-27 DIAGNOSIS — N3281 Overactive bladder: Secondary | ICD-10-CM | POA: Diagnosis not present

## 2017-10-27 DIAGNOSIS — I482 Chronic atrial fibrillation: Secondary | ICD-10-CM | POA: Diagnosis not present

## 2017-10-27 DIAGNOSIS — F0390 Unspecified dementia without behavioral disturbance: Secondary | ICD-10-CM | POA: Diagnosis not present

## 2017-10-27 DIAGNOSIS — Z79899 Other long term (current) drug therapy: Secondary | ICD-10-CM | POA: Diagnosis not present

## 2017-10-27 DIAGNOSIS — Z7901 Long term (current) use of anticoagulants: Secondary | ICD-10-CM | POA: Diagnosis not present

## 2017-10-27 DIAGNOSIS — R42 Dizziness and giddiness: Secondary | ICD-10-CM | POA: Diagnosis not present

## 2017-10-27 DIAGNOSIS — Z95818 Presence of other cardiac implants and grafts: Secondary | ICD-10-CM | POA: Diagnosis not present

## 2017-10-27 DIAGNOSIS — Z880 Allergy status to penicillin: Secondary | ICD-10-CM | POA: Diagnosis not present

## 2017-10-27 DIAGNOSIS — Z881 Allergy status to other antibiotic agents status: Secondary | ICD-10-CM | POA: Diagnosis not present

## 2017-10-27 DIAGNOSIS — Z9181 History of falling: Secondary | ICD-10-CM | POA: Diagnosis not present

## 2017-10-27 DIAGNOSIS — Z882 Allergy status to sulfonamides status: Secondary | ICD-10-CM | POA: Diagnosis not present

## 2017-10-27 DIAGNOSIS — Z7982 Long term (current) use of aspirin: Secondary | ICD-10-CM | POA: Diagnosis not present

## 2017-10-27 DIAGNOSIS — Z7902 Long term (current) use of antithrombotics/antiplatelets: Secondary | ICD-10-CM | POA: Diagnosis not present

## 2017-10-27 DIAGNOSIS — W19XXXA Unspecified fall, initial encounter: Secondary | ICD-10-CM | POA: Diagnosis not present

## 2017-10-27 DIAGNOSIS — I4891 Unspecified atrial fibrillation: Secondary | ICD-10-CM | POA: Diagnosis not present

## 2017-10-27 DIAGNOSIS — Z888 Allergy status to other drugs, medicaments and biological substances status: Secondary | ICD-10-CM | POA: Diagnosis not present

## 2017-10-27 DIAGNOSIS — G4700 Insomnia, unspecified: Secondary | ICD-10-CM | POA: Diagnosis not present

## 2017-10-27 DIAGNOSIS — Z043 Encounter for examination and observation following other accident: Secondary | ICD-10-CM | POA: Diagnosis not present

## 2017-10-27 DIAGNOSIS — M25551 Pain in right hip: Secondary | ICD-10-CM | POA: Diagnosis not present

## 2017-10-27 DIAGNOSIS — I10 Essential (primary) hypertension: Secondary | ICD-10-CM | POA: Diagnosis not present

## 2017-10-27 DIAGNOSIS — N3000 Acute cystitis without hematuria: Secondary | ICD-10-CM | POA: Diagnosis not present

## 2017-10-27 DIAGNOSIS — R55 Syncope and collapse: Secondary | ICD-10-CM | POA: Diagnosis not present

## 2017-10-27 DIAGNOSIS — E785 Hyperlipidemia, unspecified: Secondary | ICD-10-CM | POA: Diagnosis not present

## 2017-10-27 DIAGNOSIS — S199XXA Unspecified injury of neck, initial encounter: Secondary | ICD-10-CM | POA: Diagnosis not present

## 2017-10-27 DIAGNOSIS — S0990XA Unspecified injury of head, initial encounter: Secondary | ICD-10-CM | POA: Diagnosis not present

## 2017-10-27 DIAGNOSIS — E119 Type 2 diabetes mellitus without complications: Secondary | ICD-10-CM | POA: Diagnosis not present

## 2017-10-28 DIAGNOSIS — Z880 Allergy status to penicillin: Secondary | ICD-10-CM | POA: Diagnosis not present

## 2017-10-28 DIAGNOSIS — Z881 Allergy status to other antibiotic agents status: Secondary | ICD-10-CM | POA: Diagnosis not present

## 2017-10-28 DIAGNOSIS — S7011XA Contusion of right thigh, initial encounter: Secondary | ICD-10-CM | POA: Diagnosis not present

## 2017-10-28 DIAGNOSIS — Z888 Allergy status to other drugs, medicaments and biological substances status: Secondary | ICD-10-CM | POA: Diagnosis not present

## 2017-10-28 DIAGNOSIS — I2510 Atherosclerotic heart disease of native coronary artery without angina pectoris: Secondary | ICD-10-CM | POA: Diagnosis not present

## 2017-10-28 DIAGNOSIS — Z7902 Long term (current) use of antithrombotics/antiplatelets: Secondary | ICD-10-CM | POA: Diagnosis not present

## 2017-10-28 DIAGNOSIS — I480 Paroxysmal atrial fibrillation: Secondary | ICD-10-CM | POA: Diagnosis not present

## 2017-10-28 DIAGNOSIS — Z882 Allergy status to sulfonamides status: Secondary | ICD-10-CM | POA: Diagnosis not present

## 2017-10-28 DIAGNOSIS — I10 Essential (primary) hypertension: Secondary | ICD-10-CM | POA: Diagnosis not present

## 2017-10-28 DIAGNOSIS — Z7982 Long term (current) use of aspirin: Secondary | ICD-10-CM | POA: Diagnosis not present

## 2017-10-28 DIAGNOSIS — Z955 Presence of coronary angioplasty implant and graft: Secondary | ICD-10-CM | POA: Diagnosis not present

## 2017-10-28 DIAGNOSIS — Z79899 Other long term (current) drug therapy: Secondary | ICD-10-CM | POA: Diagnosis not present

## 2017-10-28 DIAGNOSIS — Z7901 Long term (current) use of anticoagulants: Secondary | ICD-10-CM | POA: Diagnosis not present

## 2017-10-28 DIAGNOSIS — I440 Atrioventricular block, first degree: Secondary | ICD-10-CM | POA: Diagnosis not present

## 2017-10-28 DIAGNOSIS — F0390 Unspecified dementia without behavioral disturbance: Secondary | ICD-10-CM | POA: Diagnosis not present

## 2017-10-28 DIAGNOSIS — S8011XA Contusion of right lower leg, initial encounter: Secondary | ICD-10-CM | POA: Diagnosis not present

## 2017-11-08 DIAGNOSIS — I350 Nonrheumatic aortic (valve) stenosis: Secondary | ICD-10-CM | POA: Diagnosis not present

## 2017-11-08 DIAGNOSIS — Z0000 Encounter for general adult medical examination without abnormal findings: Secondary | ICD-10-CM | POA: Diagnosis not present

## 2017-11-08 DIAGNOSIS — Z23 Encounter for immunization: Secondary | ICD-10-CM | POA: Diagnosis not present

## 2017-11-08 DIAGNOSIS — N390 Urinary tract infection, site not specified: Secondary | ICD-10-CM | POA: Diagnosis not present

## 2017-11-08 DIAGNOSIS — F0390 Unspecified dementia without behavioral disturbance: Secondary | ICD-10-CM | POA: Diagnosis not present

## 2017-11-08 DIAGNOSIS — R0600 Dyspnea, unspecified: Secondary | ICD-10-CM | POA: Diagnosis not present

## 2017-11-08 DIAGNOSIS — Z95818 Presence of other cardiac implants and grafts: Secondary | ICD-10-CM | POA: Diagnosis not present

## 2017-11-08 DIAGNOSIS — Z7901 Long term (current) use of anticoagulants: Secondary | ICD-10-CM | POA: Diagnosis not present

## 2017-11-08 DIAGNOSIS — I10 Essential (primary) hypertension: Secondary | ICD-10-CM | POA: Diagnosis not present

## 2017-11-08 DIAGNOSIS — R260 Ataxic gait: Secondary | ICD-10-CM | POA: Diagnosis not present

## 2017-11-08 DIAGNOSIS — R739 Hyperglycemia, unspecified: Secondary | ICD-10-CM | POA: Diagnosis not present

## 2017-11-08 DIAGNOSIS — G2581 Restless legs syndrome: Secondary | ICD-10-CM | POA: Diagnosis not present

## 2017-11-08 DIAGNOSIS — D649 Anemia, unspecified: Secondary | ICD-10-CM | POA: Diagnosis not present

## 2017-11-20 DIAGNOSIS — D649 Anemia, unspecified: Secondary | ICD-10-CM | POA: Diagnosis not present

## 2017-11-27 DIAGNOSIS — D649 Anemia, unspecified: Secondary | ICD-10-CM | POA: Diagnosis not present

## 2017-12-06 DIAGNOSIS — F0390 Unspecified dementia without behavioral disturbance: Secondary | ICD-10-CM | POA: Diagnosis not present

## 2017-12-06 DIAGNOSIS — I2510 Atherosclerotic heart disease of native coronary artery without angina pectoris: Secondary | ICD-10-CM | POA: Diagnosis not present

## 2017-12-06 DIAGNOSIS — I350 Nonrheumatic aortic (valve) stenosis: Secondary | ICD-10-CM | POA: Diagnosis not present

## 2017-12-06 DIAGNOSIS — Z7901 Long term (current) use of anticoagulants: Secondary | ICD-10-CM | POA: Diagnosis not present

## 2017-12-06 DIAGNOSIS — H3530 Unspecified macular degeneration: Secondary | ICD-10-CM | POA: Diagnosis not present

## 2017-12-06 DIAGNOSIS — I10 Essential (primary) hypertension: Secondary | ICD-10-CM | POA: Diagnosis not present

## 2017-12-06 DIAGNOSIS — R739 Hyperglycemia, unspecified: Secondary | ICD-10-CM | POA: Diagnosis not present

## 2017-12-06 DIAGNOSIS — Z95818 Presence of other cardiac implants and grafts: Secondary | ICD-10-CM | POA: Diagnosis not present

## 2017-12-06 DIAGNOSIS — I482 Chronic atrial fibrillation: Secondary | ICD-10-CM | POA: Diagnosis not present

## 2017-12-06 DIAGNOSIS — D649 Anemia, unspecified: Secondary | ICD-10-CM | POA: Diagnosis not present

## 2017-12-06 DIAGNOSIS — K649 Unspecified hemorrhoids: Secondary | ICD-10-CM | POA: Diagnosis not present

## 2017-12-06 DIAGNOSIS — R0600 Dyspnea, unspecified: Secondary | ICD-10-CM | POA: Diagnosis not present

## 2017-12-07 DIAGNOSIS — D649 Anemia, unspecified: Secondary | ICD-10-CM | POA: Diagnosis not present

## 2017-12-21 DIAGNOSIS — I482 Chronic atrial fibrillation: Secondary | ICD-10-CM | POA: Diagnosis not present

## 2018-01-01 DIAGNOSIS — R410 Disorientation, unspecified: Secondary | ICD-10-CM | POA: Diagnosis not present

## 2018-01-01 DIAGNOSIS — Z8543 Personal history of malignant neoplasm of ovary: Secondary | ICD-10-CM | POA: Diagnosis not present

## 2018-01-01 DIAGNOSIS — Z87440 Personal history of urinary (tract) infections: Secondary | ICD-10-CM | POA: Diagnosis not present

## 2018-01-01 DIAGNOSIS — Z881 Allergy status to other antibiotic agents status: Secondary | ICD-10-CM | POA: Diagnosis not present

## 2018-01-01 DIAGNOSIS — I4891 Unspecified atrial fibrillation: Secondary | ICD-10-CM | POA: Diagnosis not present

## 2018-01-01 DIAGNOSIS — I10 Essential (primary) hypertension: Secondary | ICD-10-CM | POA: Diagnosis not present

## 2018-01-01 DIAGNOSIS — Z7902 Long term (current) use of antithrombotics/antiplatelets: Secondary | ICD-10-CM | POA: Diagnosis not present

## 2018-01-01 DIAGNOSIS — E119 Type 2 diabetes mellitus without complications: Secondary | ICD-10-CM | POA: Diagnosis not present

## 2018-01-01 DIAGNOSIS — Z7901 Long term (current) use of anticoagulants: Secondary | ICD-10-CM | POA: Diagnosis not present

## 2018-01-01 DIAGNOSIS — F0390 Unspecified dementia without behavioral disturbance: Secondary | ICD-10-CM | POA: Diagnosis not present

## 2018-01-01 DIAGNOSIS — Z888 Allergy status to other drugs, medicaments and biological substances status: Secondary | ICD-10-CM | POA: Diagnosis not present

## 2018-01-01 DIAGNOSIS — Z882 Allergy status to sulfonamides status: Secondary | ICD-10-CM | POA: Diagnosis not present

## 2018-01-01 DIAGNOSIS — Z880 Allergy status to penicillin: Secondary | ICD-10-CM | POA: Diagnosis not present

## 2018-01-01 DIAGNOSIS — R4182 Altered mental status, unspecified: Secondary | ICD-10-CM | POA: Diagnosis not present

## 2018-01-01 DIAGNOSIS — Z79899 Other long term (current) drug therapy: Secondary | ICD-10-CM | POA: Diagnosis not present

## 2018-01-11 DIAGNOSIS — I482 Chronic atrial fibrillation: Secondary | ICD-10-CM | POA: Diagnosis not present

## 2018-01-11 DIAGNOSIS — D649 Anemia, unspecified: Secondary | ICD-10-CM | POA: Diagnosis not present

## 2018-01-24 DIAGNOSIS — R52 Pain, unspecified: Secondary | ICD-10-CM | POA: Diagnosis not present

## 2018-01-24 DIAGNOSIS — Z888 Allergy status to other drugs, medicaments and biological substances status: Secondary | ICD-10-CM | POA: Diagnosis not present

## 2018-01-24 DIAGNOSIS — E119 Type 2 diabetes mellitus without complications: Secondary | ICD-10-CM | POA: Diagnosis not present

## 2018-01-24 DIAGNOSIS — Z7902 Long term (current) use of antithrombotics/antiplatelets: Secondary | ICD-10-CM | POA: Diagnosis not present

## 2018-01-24 DIAGNOSIS — T148XXA Other injury of unspecified body region, initial encounter: Secondary | ICD-10-CM | POA: Diagnosis not present

## 2018-01-24 DIAGNOSIS — E785 Hyperlipidemia, unspecified: Secondary | ICD-10-CM | POA: Diagnosis not present

## 2018-01-24 DIAGNOSIS — Z7901 Long term (current) use of anticoagulants: Secondary | ICD-10-CM | POA: Diagnosis not present

## 2018-01-24 DIAGNOSIS — I2510 Atherosclerotic heart disease of native coronary artery without angina pectoris: Secondary | ICD-10-CM | POA: Diagnosis not present

## 2018-01-24 DIAGNOSIS — M25572 Pain in left ankle and joints of left foot: Secondary | ICD-10-CM | POA: Diagnosis not present

## 2018-01-24 DIAGNOSIS — I10 Essential (primary) hypertension: Secondary | ICD-10-CM | POA: Diagnosis not present

## 2018-01-24 DIAGNOSIS — Z880 Allergy status to penicillin: Secondary | ICD-10-CM | POA: Diagnosis not present

## 2018-01-24 DIAGNOSIS — S199XXA Unspecified injury of neck, initial encounter: Secondary | ICD-10-CM | POA: Diagnosis not present

## 2018-01-24 DIAGNOSIS — I480 Paroxysmal atrial fibrillation: Secondary | ICD-10-CM | POA: Diagnosis not present

## 2018-01-24 DIAGNOSIS — S0990XA Unspecified injury of head, initial encounter: Secondary | ICD-10-CM | POA: Diagnosis not present

## 2018-01-24 DIAGNOSIS — W1839XA Other fall on same level, initial encounter: Secondary | ICD-10-CM | POA: Diagnosis not present

## 2018-01-24 DIAGNOSIS — F0391 Unspecified dementia with behavioral disturbance: Secondary | ICD-10-CM | POA: Diagnosis not present

## 2018-01-24 DIAGNOSIS — Z79899 Other long term (current) drug therapy: Secondary | ICD-10-CM | POA: Diagnosis not present

## 2018-01-24 DIAGNOSIS — S161XXA Strain of muscle, fascia and tendon at neck level, initial encounter: Secondary | ICD-10-CM | POA: Diagnosis not present

## 2018-01-24 DIAGNOSIS — Z881 Allergy status to other antibiotic agents status: Secondary | ICD-10-CM | POA: Diagnosis not present

## 2018-01-25 DIAGNOSIS — I499 Cardiac arrhythmia, unspecified: Secondary | ICD-10-CM | POA: Diagnosis not present

## 2018-01-26 DIAGNOSIS — Z8673 Personal history of transient ischemic attack (TIA), and cerebral infarction without residual deficits: Secondary | ICD-10-CM | POA: Diagnosis not present

## 2018-01-26 DIAGNOSIS — Z7902 Long term (current) use of antithrombotics/antiplatelets: Secondary | ICD-10-CM | POA: Diagnosis not present

## 2018-01-26 DIAGNOSIS — I350 Nonrheumatic aortic (valve) stenosis: Secondary | ICD-10-CM | POA: Diagnosis present

## 2018-01-26 DIAGNOSIS — F0390 Unspecified dementia without behavioral disturbance: Secondary | ICD-10-CM | POA: Diagnosis not present

## 2018-01-26 DIAGNOSIS — G2581 Restless legs syndrome: Secondary | ICD-10-CM | POA: Diagnosis present

## 2018-01-26 DIAGNOSIS — S0990XA Unspecified injury of head, initial encounter: Secondary | ICD-10-CM | POA: Diagnosis not present

## 2018-01-26 DIAGNOSIS — G4733 Obstructive sleep apnea (adult) (pediatric): Secondary | ICD-10-CM | POA: Diagnosis present

## 2018-01-26 DIAGNOSIS — Z7901 Long term (current) use of anticoagulants: Secondary | ICD-10-CM | POA: Diagnosis not present

## 2018-01-26 DIAGNOSIS — R419 Unspecified symptoms and signs involving cognitive functions and awareness: Secondary | ICD-10-CM | POA: Diagnosis present

## 2018-01-26 DIAGNOSIS — I2510 Atherosclerotic heart disease of native coronary artery without angina pectoris: Secondary | ICD-10-CM | POA: Diagnosis present

## 2018-01-26 DIAGNOSIS — Z9861 Coronary angioplasty status: Secondary | ICD-10-CM | POA: Diagnosis not present

## 2018-01-26 DIAGNOSIS — R4182 Altered mental status, unspecified: Secondary | ICD-10-CM | POA: Diagnosis not present

## 2018-01-26 DIAGNOSIS — I480 Paroxysmal atrial fibrillation: Secondary | ICD-10-CM | POA: Diagnosis not present

## 2018-01-26 DIAGNOSIS — E119 Type 2 diabetes mellitus without complications: Secondary | ICD-10-CM | POA: Diagnosis not present

## 2018-01-26 DIAGNOSIS — E1122 Type 2 diabetes mellitus with diabetic chronic kidney disease: Secondary | ICD-10-CM | POA: Diagnosis not present

## 2018-01-26 DIAGNOSIS — G301 Alzheimer's disease with late onset: Secondary | ICD-10-CM | POA: Diagnosis not present

## 2018-01-26 DIAGNOSIS — I4891 Unspecified atrial fibrillation: Secondary | ICD-10-CM | POA: Diagnosis not present

## 2018-01-26 DIAGNOSIS — G319 Degenerative disease of nervous system, unspecified: Secondary | ICD-10-CM | POA: Diagnosis not present

## 2018-01-26 DIAGNOSIS — I482 Chronic atrial fibrillation: Secondary | ICD-10-CM | POA: Diagnosis present

## 2018-01-26 DIAGNOSIS — E785 Hyperlipidemia, unspecified: Secondary | ICD-10-CM | POA: Diagnosis not present

## 2018-01-26 DIAGNOSIS — I129 Hypertensive chronic kidney disease with stage 1 through stage 4 chronic kidney disease, or unspecified chronic kidney disease: Secondary | ICD-10-CM | POA: Diagnosis present

## 2018-01-26 DIAGNOSIS — I10 Essential (primary) hypertension: Secondary | ICD-10-CM | POA: Diagnosis not present

## 2018-01-26 DIAGNOSIS — I6782 Cerebral ischemia: Secondary | ICD-10-CM | POA: Diagnosis not present

## 2018-01-26 DIAGNOSIS — E1165 Type 2 diabetes mellitus with hyperglycemia: Secondary | ICD-10-CM | POA: Diagnosis not present

## 2018-01-26 DIAGNOSIS — F0281 Dementia in other diseases classified elsewhere with behavioral disturbance: Secondary | ICD-10-CM | POA: Diagnosis not present

## 2018-01-26 DIAGNOSIS — F0391 Unspecified dementia with behavioral disturbance: Secondary | ICD-10-CM | POA: Diagnosis present

## 2018-01-26 DIAGNOSIS — N183 Chronic kidney disease, stage 3 (moderate): Secondary | ICD-10-CM | POA: Diagnosis present

## 2018-01-26 DIAGNOSIS — F22 Delusional disorders: Secondary | ICD-10-CM | POA: Diagnosis not present

## 2018-01-26 DIAGNOSIS — Z789 Other specified health status: Secondary | ICD-10-CM | POA: Diagnosis not present

## 2018-01-26 DIAGNOSIS — K219 Gastro-esophageal reflux disease without esophagitis: Secondary | ICD-10-CM | POA: Diagnosis present

## 2018-01-26 DIAGNOSIS — S161XXA Strain of muscle, fascia and tendon at neck level, initial encounter: Secondary | ICD-10-CM | POA: Diagnosis not present

## 2018-01-26 DIAGNOSIS — F99 Mental disorder, not otherwise specified: Secondary | ICD-10-CM | POA: Diagnosis not present

## 2018-01-26 DIAGNOSIS — G600 Hereditary motor and sensory neuropathy: Secondary | ICD-10-CM | POA: Diagnosis present

## 2018-02-16 DIAGNOSIS — Z881 Allergy status to other antibiotic agents status: Secondary | ICD-10-CM | POA: Diagnosis not present

## 2018-02-16 DIAGNOSIS — Z8673 Personal history of transient ischemic attack (TIA), and cerebral infarction without residual deficits: Secondary | ICD-10-CM | POA: Diagnosis not present

## 2018-02-16 DIAGNOSIS — F0390 Unspecified dementia without behavioral disturbance: Secondary | ICD-10-CM | POA: Diagnosis not present

## 2018-02-16 DIAGNOSIS — Z880 Allergy status to penicillin: Secondary | ICD-10-CM | POA: Diagnosis not present

## 2018-02-16 DIAGNOSIS — Z882 Allergy status to sulfonamides status: Secondary | ICD-10-CM | POA: Diagnosis not present

## 2018-02-16 DIAGNOSIS — I4891 Unspecified atrial fibrillation: Secondary | ICD-10-CM | POA: Diagnosis not present

## 2018-02-16 DIAGNOSIS — I10 Essential (primary) hypertension: Secondary | ICD-10-CM | POA: Diagnosis not present

## 2018-02-16 DIAGNOSIS — Z888 Allergy status to other drugs, medicaments and biological substances status: Secondary | ICD-10-CM | POA: Diagnosis not present

## 2018-02-16 DIAGNOSIS — E785 Hyperlipidemia, unspecified: Secondary | ICD-10-CM | POA: Diagnosis not present

## 2018-02-16 DIAGNOSIS — S80812A Abrasion, left lower leg, initial encounter: Secondary | ICD-10-CM | POA: Diagnosis not present

## 2018-02-16 DIAGNOSIS — I2510 Atherosclerotic heart disease of native coronary artery without angina pectoris: Secondary | ICD-10-CM | POA: Diagnosis not present

## 2018-02-16 DIAGNOSIS — Z79899 Other long term (current) drug therapy: Secondary | ICD-10-CM | POA: Diagnosis not present

## 2018-02-16 DIAGNOSIS — E1142 Type 2 diabetes mellitus with diabetic polyneuropathy: Secondary | ICD-10-CM | POA: Diagnosis not present

## 2018-02-16 DIAGNOSIS — Z7901 Long term (current) use of anticoagulants: Secondary | ICD-10-CM | POA: Diagnosis not present

## 2018-03-04 DIAGNOSIS — J449 Chronic obstructive pulmonary disease, unspecified: Secondary | ICD-10-CM | POA: Diagnosis not present

## 2018-03-04 DIAGNOSIS — N3090 Cystitis, unspecified without hematuria: Secondary | ICD-10-CM | POA: Diagnosis not present

## 2018-03-04 DIAGNOSIS — J441 Chronic obstructive pulmonary disease with (acute) exacerbation: Secondary | ICD-10-CM | POA: Diagnosis not present

## 2018-03-04 DIAGNOSIS — R062 Wheezing: Secondary | ICD-10-CM | POA: Diagnosis not present

## 2018-03-09 ENCOUNTER — Emergency Department (INDEPENDENT_AMBULATORY_CARE_PROVIDER_SITE_OTHER)
Admission: EM | Admit: 2018-03-09 | Discharge: 2018-03-09 | Disposition: A | Discharge status: Home / Self Care | Payer: Medicare Other | Source: Home / Self Care | Attending: Family Medicine | Admitting: Family Medicine

## 2018-03-09 ENCOUNTER — Telehealth: Payer: Self-pay | Admitting: Emergency Medicine

## 2018-03-09 ENCOUNTER — Other Ambulatory Visit: Payer: Self-pay

## 2018-03-09 ENCOUNTER — Emergency Department (INDEPENDENT_AMBULATORY_CARE_PROVIDER_SITE_OTHER): Payer: Medicare Other

## 2018-03-09 DIAGNOSIS — W19XXXA Unspecified fall, initial encounter: Secondary | ICD-10-CM | POA: Diagnosis not present

## 2018-03-09 DIAGNOSIS — S0990XA Unspecified injury of head, initial encounter: Secondary | ICD-10-CM

## 2018-03-09 DIAGNOSIS — S022XXA Fracture of nasal bones, initial encounter for closed fracture: Secondary | ICD-10-CM | POA: Diagnosis not present

## 2018-03-09 DIAGNOSIS — S0121XA Laceration without foreign body of nose, initial encounter: Secondary | ICD-10-CM | POA: Diagnosis not present

## 2018-03-09 HISTORY — DX: Anemia, unspecified: D64.9

## 2018-03-09 HISTORY — DX: Unspecified dementia, unspecified severity, without behavioral disturbance, psychotic disturbance, mood disturbance, and anxiety: F03.90

## 2018-03-09 HISTORY — DX: Unspecified atrial fibrillation: I48.91

## 2018-03-09 HISTORY — DX: Nonrheumatic aortic (valve) stenosis: I35.0

## 2018-03-09 NOTE — Discharge Instructions (Signed)
Apply ice pack to nose for 15 minutes, every 1 to 2 hours.  Continue for about 2 days until pain and swelling decrease.  May take Tylenol as needed for pain. Keep wound clean and dry.  Return for any signs of infection (or follow-up with family doctor):  Increasing redness, swelling, pain, heat, drainage, etc. Follow instructions on Dermabond information sheet.  If symptoms become significantly worse during the night or over the weekend, proceed to the local emergency room.

## 2018-03-09 NOTE — ED Notes (Signed)
[facility] will be calling patient with an appt.

## 2018-03-09 NOTE — ED Provider Notes (Signed)
[facility]    CSN: [id number] Arrival date & time: 03/09/18  0817     History   Chief Complaint Chief Complaint  Patient presents with  . Fall  . Laceration    HPI Bethany Park is a 84 y.o. female.   At about 7am today patient climbed out of bed to go to the bathroom.  She lost her balance and fell, striking her nose and forehead.  She denies loss of consciousness.  She had a brief nosebleed that resolved.  She denies headache or localizing neurologic symptoms.  She denies dizziness or imbalance at this time.  Her behavior has been normal according to her family.  The history is provided by the patient and a relative.  Head Injury  Location:  Frontal Time since incident:  2 hours Mechanism of injury: fall   Fall:    Fall occurred:  Walking   Impact surface:  Hard floor   Point of impact:  Face   Entrapped after fall: no   Pain details:    Quality:  Aching   Severity:  Mild   Progression:  Improving Chronicity:  New Relieved by:  None tried Ineffective treatments:  None tried Associated symptoms: no blurred vision, no disorientation, no double vision, no focal weakness, no headaches, no hearing loss, no loss of consciousness, no memory loss, no nausea, no neck pain, no numbness, no seizures, no tinnitus and no vomiting     Past Medical History:  Diagnosis Date  . Anemia   . Aortic stenosis   . Atrial fibrillation ([place])   . Dementia     There are no active problems to display for this patient.   Past Surgical History:  Procedure Laterality Date  . ABDOMINAL HYSTERECTOMY    . BACK SURGERY    . BREAST SURGERY    . CHOLECYSTECTOMY    . CORONARY STENT PLACEMENT    . EYE SURGERY    . HEMORROIDECTOMY    . JOINT REPLACEMENT      OB History   None      Home Medications    Prior to Admission medications   Medication Sig Start Date End Date Taking? Authorizing Provider  atenolol (TENORMIN) 25 MG tablet Take 25 mg by mouth daily.   Yes  [provider]  atorvastatin (LIPITOR) 40 MG tablet Take 40 mg by mouth daily.   Yes [provider]  carvedilol (COREG) 3.125 MG tablet Take 3.125 mg by mouth 2 (two) times daily with a meal.   Yes [provider]  clopidogrel (PLAVIX) 75 MG tablet Take 75 mg by mouth daily.   Yes [provider]  furosemide (LASIX) 20 MG tablet Take 20 mg by mouth.   Yes [provider]  lisinopril (PRINIVIL,ZESTRIL) 20 MG tablet Take 20 mg by mouth daily.   Yes [provider]  warfarin (COUMADIN) 5 MG tablet Take 5 mg by mouth daily.   Yes [provider]    Family History History reviewed. No pertinent family history.  Social History Social History   Tobacco Use  . Smoking status: Unknown If Ever Smoked  . Smokeless tobacco: Never Used  Substance Use Topics  . Alcohol use: Not Currently  . Drug use: Not on file     Allergies   Alendronate sodium; Benztropine mesylate [benztropine]; Citalopram; Cymbalta [duloxetine hcl]; Erythromycin; Fluoxetine hcl; Glimepiride; Nortriptyline hcl; Penicillins; and Sulfa antibiotics   Review of Systems Review of Systems  Constitutional: Negative for activity change.  HENT: Positive for facial swelling. Negative for ear discharge, hearing loss, tinnitus and trouble swallowing.   Eyes: Negative for blurred vision, double vision and visual disturbance.  Respiratory: Negative.   Cardiovascular: Negative.   Gastrointestinal: Negative for nausea and vomiting.  Genitourinary: Negative.   Musculoskeletal: Negative for neck pain.  Skin: Positive for wound.  Neurological: Negative for dizziness, tremors, focal weakness, seizures, loss of consciousness, syncope, facial asymmetry, speech difficulty, weakness, light-headedness, numbness and headaches.  Psychiatric/Behavioral: Negative for memory loss.     Physical Exam Triage Vital Signs ED Triage Vitals  Enc Vitals Group     BP 03/09/18 0845 (!)  190/94     Pulse Rate 03/09/18 0845 84     Resp --      Temp 03/09/18 0845 97.6 F (36.4 C)     Temp Source 03/09/18 0845 Oral     SpO2 03/09/18 0845 96 %     Weight 03/09/18 0846 153 lb (69.4 kg)     Height 03/09/18 0846 5' 4.5" (1.638 m)     Head Circumference --      Peak Flow --      Pain Score 03/09/18 0846 10     Pain Loc --      Pain Edu? --      Excl. in [place]? --    No data found.  Updated Vital Signs BP (!) 190/94 (BP Location: Right Arm)   Pulse 84   Temp 97.6 F (36.4 C) (Oral)   Ht 5' 4.5" (1.638 m)   Wt 153 lb (69.4 kg)   SpO2 96%   BMI 25.86 kg/m   Visual Acuity Right Eye Distance:   Left Eye Distance:   Bilateral Distance:    Right Eye Near:   Left Eye Near:    Bilateral Near:     Physical Exam  Constitutional: She appears well-developed and well-nourished. No distress.  HENT:  Right Ear: Tympanic membrane, external ear and ear canal normal.  Left Ear: Tympanic membrane, external ear and ear canal normal.  Nose: Nose lacerations, sinus tenderness and nasal deformity present. No septal deviation or nasal septal hematoma. No epistaxis. Right sinus exhibits no maxillary sinus tenderness.    Mouth/Throat: Oropharynx is clear and moist.  Nose is swollen and distinctly tender to palpation.  Superficial simple laceration nose as noted on diagram.  No facial swelling.  Eyes: Pupils are equal, round, and reactive to light. Conjunctivae and EOM are normal.  Neck: Normal range of motion.  Cardiovascular: Normal heart sounds.  Pulmonary/Chest: Breath sounds normal.  Abdominal: There is no tenderness.  Musculoskeletal: Normal range of motion.  Neurological: No cranial nerve deficit. Coordination normal.  Skin: Skin is warm and dry.  Psychiatric: Her behavior is normal.  Nursing note and vitals reviewed.    UC Treatments / Results  Labs (all labs ordered are listed, but only abnormal results are displayed) Labs Reviewed - No data to  display  EKG None  Radiology Dg Nasal Bones  Result Date: 03/09/2018 CLINICAL DATA:  Status post fall.  Nasal pain. EXAM: NASAL BONES - 3+ VIEW COMPARISON:  None. FINDINGS: Mildly depressed fracture of the nasal bone with 2 mm of diffuse depression. Paranasal sinuses are clear. IMPRESSION: Mild depressed fracture of the nasal bone. Electronically Signed   By: Kathreen Devoid   On: 03/09/2018 09:54    Procedure Procedures  Laceration Repair (Dermabond) Discussed benefits and risks of procedure and verbal consent obtained. Using sterile technique, cleansed wound with Betadine  followed by lavage with normal saline.  Wound carefully inspected for debris and foreign bodies; none found.  Wound edges carefully approximated in normal anatomic position and closed with Dermabond.  Wound precautions explained to patient.     Medications Ordered in UC Medications - No data to display  Initial Impression / Assessment and Plan / UC Course  I have reviewed the triage vital signs and the nursing notes.  Pertinent labs & imaging results that were available during my care of the patient were reviewed by me and considered in my medical decision making (see chart for details).    Followup with [facility] for nasal bone fracture. Discussed head injury precautions  Final Clinical Impressions(s) / UC Diagnoses   Final diagnoses:  Injury of head, initial encounter  Closed fracture of nasal bone, initial encounter  Laceration of nose, initial encounter     Discharge Instructions     Apply ice pack to nose for 15 minutes, every 1 to 2 hours.  Continue for about 2 days until pain and swelling decrease.  May take Tylenol as needed for pain. Keep wound clean and dry.  Return for any signs of infection (or follow-up with family doctor):  Increasing redness, swelling, pain, heat, drainage, etc. Follow instructions on Dermabond information sheet.  If symptoms become significantly worse during  the night or over the weekend, proceed to the local emergency room.     ED Prescriptions    None         Kandra Nicolas, MD 03/16/18 1454

## 2018-03-09 NOTE — ED Triage Notes (Signed)
Pt got up to go to BR about 7 this am and lost her balance and fell cutting her nose and hitting her forehead.

## 2018-03-12 NOTE — Telephone Encounter (Signed)
Spoke to pts daughter. States shes doing very well and has a f/u appt with a nose doctor this week. Advised her to call us should she have any questions or concerns.

## 2018-03-13 ENCOUNTER — Encounter: Payer: Self-pay | Admitting: Osteopathic Medicine

## 2018-03-13 DIAGNOSIS — Z8673 Personal history of transient ischemic attack (TIA), and cerebral infarction without residual deficits: Secondary | ICD-10-CM | POA: Insufficient documentation

## 2018-03-13 DIAGNOSIS — I2510 Atherosclerotic heart disease of native coronary artery without angina pectoris: Secondary | ICD-10-CM | POA: Insufficient documentation

## 2018-03-13 DIAGNOSIS — S022XXA Fracture of nasal bones, initial encounter for closed fracture: Secondary | ICD-10-CM | POA: Diagnosis not present

## 2018-03-13 DIAGNOSIS — H3530 Unspecified macular degeneration: Secondary | ICD-10-CM | POA: Insufficient documentation

## 2018-03-13 DIAGNOSIS — E11311 Type 2 diabetes mellitus with unspecified diabetic retinopathy with macular edema: Secondary | ICD-10-CM | POA: Insufficient documentation

## 2018-03-13 DIAGNOSIS — H209 Unspecified iridocyclitis: Secondary | ICD-10-CM | POA: Insufficient documentation

## 2018-03-13 DIAGNOSIS — E538 Deficiency of other specified B group vitamins: Secondary | ICD-10-CM

## 2018-03-13 DIAGNOSIS — I4891 Unspecified atrial fibrillation: Secondary | ICD-10-CM | POA: Insufficient documentation

## 2018-03-13 DIAGNOSIS — Z955 Presence of coronary angioplasty implant and graft: Secondary | ICD-10-CM | POA: Insufficient documentation

## 2018-03-13 DIAGNOSIS — E785 Hyperlipidemia, unspecified: Secondary | ICD-10-CM | POA: Insufficient documentation

## 2018-03-13 DIAGNOSIS — I350 Nonrheumatic aortic (valve) stenosis: Secondary | ICD-10-CM

## 2018-03-13 DIAGNOSIS — K5090 Crohn's disease, unspecified, without complications: Secondary | ICD-10-CM | POA: Insufficient documentation

## 2018-03-13 DIAGNOSIS — F0390 Unspecified dementia without behavioral disturbance: Secondary | ICD-10-CM | POA: Insufficient documentation

## 2018-03-13 DIAGNOSIS — G4730 Sleep apnea, unspecified: Secondary | ICD-10-CM | POA: Insufficient documentation

## 2018-03-13 DIAGNOSIS — D649 Anemia, unspecified: Secondary | ICD-10-CM | POA: Insufficient documentation

## 2018-03-13 DIAGNOSIS — E113299 Type 2 diabetes mellitus with mild nonproliferative diabetic retinopathy without macular edema, unspecified eye: Secondary | ICD-10-CM | POA: Insufficient documentation

## 2018-03-13 DIAGNOSIS — G2581 Restless legs syndrome: Secondary | ICD-10-CM

## 2018-03-13 DIAGNOSIS — M810 Age-related osteoporosis without current pathological fracture: Secondary | ICD-10-CM

## 2018-03-13 DIAGNOSIS — E7439 Other disorders of intestinal carbohydrate absorption: Secondary | ICD-10-CM | POA: Insufficient documentation

## 2018-03-13 DIAGNOSIS — F418 Other specified anxiety disorders: Secondary | ICD-10-CM

## 2018-03-13 DIAGNOSIS — I10 Essential (primary) hypertension: Secondary | ICD-10-CM

## 2018-03-13 DIAGNOSIS — K635 Polyp of colon: Secondary | ICD-10-CM | POA: Insufficient documentation

## 2018-03-13 DIAGNOSIS — N3281 Overactive bladder: Secondary | ICD-10-CM | POA: Insufficient documentation

## 2018-03-13 HISTORY — DX: Other disorders of intestinal carbohydrate absorption: E74.39

## 2018-03-13 HISTORY — DX: Age-related osteoporosis without current pathological fracture: M81.0

## 2018-03-13 HISTORY — DX: Restless legs syndrome: G25.81

## 2018-03-13 HISTORY — DX: Deficiency of other specified B group vitamins: E53.8

## 2018-03-13 HISTORY — DX: Other specified anxiety disorders: F41.8

## 2018-03-13 HISTORY — DX: Essential (primary) hypertension: I10

## 2018-03-13 HISTORY — DX: Nonrheumatic aortic (valve) stenosis: I35.0

## 2018-03-15 ENCOUNTER — Ambulatory Visit (INDEPENDENT_AMBULATORY_CARE_PROVIDER_SITE_OTHER): Payer: Medicare Other | Admitting: Osteopathic Medicine

## 2018-03-15 ENCOUNTER — Encounter: Payer: Self-pay | Admitting: Osteopathic Medicine

## 2018-03-15 VITALS — BP 127/80 | HR 99 | Temp 97.6°F | Wt 145.8 lb

## 2018-03-15 DIAGNOSIS — Z955 Presence of coronary angioplasty implant and graft: Secondary | ICD-10-CM | POA: Diagnosis not present

## 2018-03-15 DIAGNOSIS — F0391 Unspecified dementia with behavioral disturbance: Secondary | ICD-10-CM

## 2018-03-15 DIAGNOSIS — G4730 Sleep apnea, unspecified: Secondary | ICD-10-CM | POA: Diagnosis not present

## 2018-03-15 DIAGNOSIS — I350 Nonrheumatic aortic (valve) stenosis: Secondary | ICD-10-CM

## 2018-03-15 DIAGNOSIS — R262 Difficulty in walking, not elsewhere classified: Secondary | ICD-10-CM | POA: Diagnosis not present

## 2018-03-15 DIAGNOSIS — I10 Essential (primary) hypertension: Secondary | ICD-10-CM | POA: Diagnosis not present

## 2018-03-15 DIAGNOSIS — M810 Age-related osteoporosis without current pathological fracture: Secondary | ICD-10-CM | POA: Diagnosis not present

## 2018-03-15 DIAGNOSIS — I2510 Atherosclerotic heart disease of native coronary artery without angina pectoris: Secondary | ICD-10-CM | POA: Diagnosis not present

## 2018-03-15 DIAGNOSIS — Z9181 History of falling: Secondary | ICD-10-CM | POA: Diagnosis not present

## 2018-03-15 DIAGNOSIS — I4819 Other persistent atrial fibrillation: Secondary | ICD-10-CM

## 2018-03-15 DIAGNOSIS — I481 Persistent atrial fibrillation: Secondary | ICD-10-CM

## 2018-03-15 LAB — POCT INR: INR: 1.6

## 2018-03-15 MED ORDER — ATORVASTATIN CALCIUM 40 MG PO TABS: 40.0000 mg | ORAL_TABLET | Freq: Every day | ORAL | 3 refills | Status: DC

## 2018-03-15 MED ORDER — LISINOPRIL 2.5 MG PO TABS: 2.5000 mg | ORAL_TABLET | Freq: Every day | ORAL | 3 refills | Status: DC

## 2018-03-15 MED ORDER — TEMAZEPAM 15 MG PO CAPS: 15.0000 mg | ORAL_CAPSULE | Freq: Every evening | ORAL | 3 refills | Status: DC | PRN

## 2018-03-15 MED ORDER — CARVEDILOL 3.125 MG PO TABS: 3.1250 mg | ORAL_TABLET | Freq: Two times a day (BID) | ORAL | 3 refills | Status: DC

## 2018-03-15 MED ORDER — CLOPIDOGREL BISULFATE 75 MG PO TABS: 75.0000 mg | ORAL_TABLET | Freq: Every day | ORAL | 3 refills | Status: DC

## 2018-03-15 MED ORDER — WARFARIN SODIUM 5 MG PO TABS: 5.0000 mg | ORAL_TABLET | Freq: Every day | ORAL | 3 refills | Status: DC

## 2018-03-15 MED ORDER — LORAZEPAM 0.5 MG PO TABS: 0.5000 mg | ORAL_TABLET | Freq: Two times a day (BID) | ORAL | 2 refills | Status: DC | PRN

## 2018-03-15 MED ORDER — FAMOTIDINE 20 MG PO TABS: 20.0000 mg | ORAL_TABLET | Freq: Every day | ORAL | 3 refills | Status: DC

## 2018-03-15 MED ORDER — ROPINIROLE HCL 2 MG PO TABS: 2.0000 mg | ORAL_TABLET | Freq: Two times a day (BID) | ORAL | 3 refills | Status: DC

## 2018-03-15 MED ORDER — QUETIAPINE FUMARATE 50 MG PO TABS: ORAL_TABLET | ORAL | 3 refills | Status: DC

## 2018-03-15 NOTE — Progress Notes (Signed)
HPI: Bethany Park is a 84 y.o. female who  has a past medical history of Anemia, Aortic stenosis, Atrial fibrillation ([place]), B12 deficiency (03/13/2018), Colon polyp (03/13/2018), Crohn disease ([place]) (03/13/2018), Dementia, Depression with anxiety (03/13/2018), Essential hypertension (03/13/2018), Glucose intolerance (03/13/2018), History of coronary artery stent placement (03/13/2018), HLD (hyperlipidemia) (03/13/2018), Moderate aortic stenosis (03/13/2018), Osteoporosis (03/13/2018), and Restless leg syndrome (03/13/2018).  she presents to [facility] today, 03/15/18,  for chief complaint of: New to Establish - Multiple med issues - see headings below   Very pleasant new patient here to establish. I take care of her daughter's family. She just moved here form [place].   Daughter was able to obtain records, I had the chance to review these prior to the visit. Most of her chronic conditions at this point are stable.  Coronary artery disease, atrial fibrillation, ortic stenosis, hyperlipidemia: Recent stent placement in December 2018. See notes in problem list. Patient is taking Coumadin, INR today was subtherapeutic but daughter has been skipping every other day dosing ince she INR recheck was over due didn't want her to be an increased risk of bleeding.  nd dementia: Behavioral disturbance, particularly sundowning, trouble sleeping.Daughter is cautious with medications, aware of potential sedating side effects but notes that her mom struggles to sleep at night. Notably, she is on temazepam daily at bedtime, she also has lorazepam to take as needed, daughter controls all medications and she states she uses this medicine during the daytime there is sparingly, knows not to use these medications together  Patient also is a fall risk. She ha some mild laceration and bruising on her face, recently evaluated in urgent care. Daughter is trying to get her home set up with that  with rails, handles on bathroom. She already has a bedside commode.  Patient is accompanied by daughter who assists with history-taking.   Past medical, surgical, social and family history reviewed:  Patient Active Problem List   Diagnosis Date Noted  . 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 Diabetic macular edema ([place]) 03/13/2018  . Mild nonproliferative diabetic retinopathy ([place]) 03/13/2018  . Atrial fibrillation ([place]) 03/13/2018  . Essential hypertension 03/13/2018  . History of coronary artery stent placement 03/13/2018  . ASCVD (arteriosclerotic cardiovascular disease) 03/13/2018  . Moderate aortic stenosis 03/13/2018  . Dementia 03/13/2018  . HLD (hyperlipidemia) 03/13/2018  . B12 deficiency 03/13/2018  . Osteoporosis 03/13/2018  . Mild sleep apnea 03/13/2018  . Colon polyp 03/13/2018  . Restless leg syndrome 03/13/2018  . Macular degeneration 03/13/2018  . Anemia 03/13/2018  . Depression with anxiety 03/13/2018  . Overactive bladder 03/13/2018  . History of CVA (cerebrovascular accident) 03/13/2018  . Glucose intolerance 03/13/2018  . Crohn disease ([place]) 03/13/2018    Past Surgical History:  Procedure Laterality Date  . ABDOMINAL HYSTERECTOMY    . BACK SURGERY    . BREAST SURGERY    . CHOLECYSTECTOMY    . CORONARY STENT PLACEMENT    . EYE SURGERY    . HEMORROIDECTOMY    . JOINT REPLACEMENT      Social History   Tobacco Use  . Smoking status: Unknown If Ever Smoked  . Smokeless tobacco: Never Used  Substance Use Topics  . Alcohol use: Not Currently    No family history on file.   Current medication list and allergy/intolerance information reviewed. Daughter brought all pill bottles to visit. Several medication lists in the record, none of which are totally congruent with one another.   Allergies  Allergen Reactions  . Alendronate Sodium   . Benztropine Mesylate [Benztropine]   . Citalopram   . Cymbalta [Duloxetine Hcl]   . Erythromycin   . Fluoxetine Hcl    . Glimepiride   . Nortriptyline Hcl   . Penicillins   . Sulfa Antibiotics       Review of Systems:obtained with assistance of family member  Constitutional:  No  fever, no chills, No recent illness, No unintentional weight changes. +significant fatigue.   HEENT: No  headache, no vision change, no hearing change  Cardiac: No  chest pain, No  pressure, No palpitations,  Respiratory:  No  shortness of breath. No  Cough  Gastrointestinal: No  abdominal pain, No  nausea, No  vomiting,  No  blood in stool, No  diarrhea, No  constipation   Musculoskeletal: No new myalgia/arthralgia  Skin: No  Rash  Hem/Onc: +easy bruising/bleeding, No  abnormal lymph node  Endocrine: No cold intolerance,  No heat intolerance. No polyuria/polydipsia/polyphagia   Neurologic: +generalized weakness, No  dizziness, No  slurred speech/focal weakness/facial droop  Psychiatric: +concerns with depression, +concerns with anxiety, +sleep problems, +mood problems - related to dementia with underlying depression issues  Exam:  BP 127/80 (BP Location: Left Arm, Patient Position: Sitting, Cuff Size: Normal)   Pulse 99   Temp 97.6 F (36.4 C) (Oral)   Wt 145 lb 12.8 oz (66.1 kg)   BMI 24.64 kg/m   Constitutional: VS see above. General Appearance: alert, well-developed, well-nourished, NAD  Eyes: Normal lids and conjunctive, non-icteric sclera  Ears, Nose, Mouth, Throat: MMM, Normal external inspection ears/nares/mouth/lips/gums.  Neck: No masses, trachea midline. No thyroid enlargement. No tenderness/mass appreciated. No lymphadenopathy  Respiratory: Normal respiratory effort. no wheeze, no rhonchi, no rales  Cardiovascular: S1/S2 normal, +3-4/6 murmur, no rub/gallop auscultated. RRR in setting of Afob ?paroxysmal. No lower extremity edema.  Gastrointestinal: Nontender, no masses.   Musculoskeletal: Gait relatively stable with assistance of walker  Neurological: compromised balance/coordination.  No tremor.  Skin: warm, dry, small laceration and bruising over the bridge of the nose   Psychiatric: Poor judgment/insight. Normal mood and affect. Oriented x person, no she is in the doctor's office,daughter is. Is off on unrelated tangents, daughter provides most of the history.   Results for orders placed or performed in visit on 03/15/18 (from the past 72 hour(s))  POCT INR     Status: None   Collection Time: 03/15/18  2:57 PM  Result Value Ref Range   INR 1.6       ASSESSMENT/PLAN:   ASCVD (arteriosclerotic cardiovascular disease) - confirmed with Dr. Stanford Breed okay to take Coumadin plus Plavix, protocol after stenting in someone with her history - Plan: Ambulatory referral to Cardiology  Essential hypertension - Plan: Ambulatory referral to Cardiology  Persistent atrial fibrillation ([place]) - coumadin daily dosing and will recheck in 1 week - Plan: Ambulatory referral to Cardiology, POCT INR  Moderate aortic stenosis - Plan: Ambulatory referral to Cardiology  Mild sleep apnea  Dementia with behavioral disturbance, unspecified dementia type - sleep issues, we'll try increasing Seroquel dose at night and see if this might help, reluctant to increase benzodiazepine or add other sedation - Plan: Ambulatory referral to [place]  History of coronary artery stent placement - Plan: Ambulatory referral to [place], Ambulatory referral to Cardiology  Age-related osteoporosis without current pathological fracture - Plan: Ambulatory referral to [place]  Ambulatory dysfunction - Plan: Ambulatory referral to [place]  At high risk for injury  related to fall - Plan: Ambulatory referral to [place]   I advised daughter to be on Plavix plus Coumadin until/unless we hear otherwise from cardiogy, will call to confirm once I hear something back from him   Visit summary with medication list and pertinent instructions was printed for patient to review. All questions at time of  visit were answered - patient instructed to contact office with any additional concerns. ER/RTC precautions were reviewed with the patient.   Follow-up plan: Return in about 1 week (around 03/22/2018) for see Dr A, review med changes, recheck INR .  Note: Total time spent 60 minutes, greater than 50% of the visit was spent face-to-face counseling and coordinating care for the following: The primary encounter diagnosis was Essential hypertension. Diagnoses of ASCVD (arteriosclerotic cardiovascular disease), Persistent atrial fibrillation (HCC), Moderate aortic stenosis, Mild sleep apnea, Dementia with behavioral disturbance, unspecified dementia type, History of coronary artery stent placement, Age-related osteoporosis without current pathological fracture, Ambulatory dysfunction, and At high risk for injury related to fall were also pertinent to this visit.Marland Kitchen  Please note: voice recognition software was used to produce this document, and typos may escape review. Please contact Dr. Sheppard Coil for any needed clarifications.     Tried to call 03/15/18 4:10 PM w/ cardio recs, no answer, no voicemail, will try again tomorrow. Assistant tried 03/16/18 nd voice mail boxes full, they're following up in a week, will try again Monday and address it at visit for sure

## 2018-03-16 ENCOUNTER — Encounter: Payer: Self-pay | Admitting: Osteopathic Medicine

## 2018-03-19 ENCOUNTER — Encounter: Payer: Self-pay | Admitting: Osteopathic Medicine

## 2018-03-20 ENCOUNTER — Encounter: Payer: Self-pay | Admitting: Osteopathic Medicine

## 2018-03-21 ENCOUNTER — Encounter: Payer: Self-pay | Admitting: Osteopathic Medicine

## 2018-03-21 DIAGNOSIS — F0390 Unspecified dementia without behavioral disturbance: Secondary | ICD-10-CM | POA: Diagnosis not present

## 2018-03-21 DIAGNOSIS — E539 Vitamin B deficiency, unspecified: Secondary | ICD-10-CM | POA: Diagnosis not present

## 2018-03-21 DIAGNOSIS — I481 Persistent atrial fibrillation: Secondary | ICD-10-CM | POA: Diagnosis not present

## 2018-03-21 DIAGNOSIS — J449 Chronic obstructive pulmonary disease, unspecified: Secondary | ICD-10-CM | POA: Diagnosis not present

## 2018-03-21 DIAGNOSIS — I2510 Atherosclerotic heart disease of native coronary artery without angina pectoris: Secondary | ICD-10-CM | POA: Diagnosis not present

## 2018-03-21 DIAGNOSIS — D649 Anemia, unspecified: Secondary | ICD-10-CM | POA: Diagnosis not present

## 2018-03-23 DIAGNOSIS — I2510 Atherosclerotic heart disease of native coronary artery without angina pectoris: Secondary | ICD-10-CM | POA: Diagnosis not present

## 2018-03-23 DIAGNOSIS — E539 Vitamin B deficiency, unspecified: Secondary | ICD-10-CM | POA: Diagnosis not present

## 2018-03-23 DIAGNOSIS — I481 Persistent atrial fibrillation: Secondary | ICD-10-CM | POA: Diagnosis not present

## 2018-03-23 DIAGNOSIS — J449 Chronic obstructive pulmonary disease, unspecified: Secondary | ICD-10-CM | POA: Diagnosis not present

## 2018-03-23 DIAGNOSIS — F0390 Unspecified dementia without behavioral disturbance: Secondary | ICD-10-CM | POA: Diagnosis not present

## 2018-03-23 DIAGNOSIS — D649 Anemia, unspecified: Secondary | ICD-10-CM | POA: Diagnosis not present

## 2018-03-27 ENCOUNTER — Encounter: Payer: Self-pay | Admitting: Osteopathic Medicine

## 2018-03-27 ENCOUNTER — Ambulatory Visit (INDEPENDENT_AMBULATORY_CARE_PROVIDER_SITE_OTHER): Payer: Medicare Other | Admitting: Osteopathic Medicine

## 2018-03-27 VITALS — BP 134/82 | HR 61 | Temp 98.0°F | Wt 145.1 lb

## 2018-03-27 DIAGNOSIS — I2510 Atherosclerotic heart disease of native coronary artery without angina pectoris: Secondary | ICD-10-CM | POA: Diagnosis not present

## 2018-03-27 DIAGNOSIS — G600 Hereditary motor and sensory neuropathy: Secondary | ICD-10-CM

## 2018-03-27 DIAGNOSIS — Z955 Presence of coronary angioplasty implant and graft: Secondary | ICD-10-CM

## 2018-03-27 DIAGNOSIS — F0391 Unspecified dementia with behavioral disturbance: Secondary | ICD-10-CM | POA: Diagnosis not present

## 2018-03-27 DIAGNOSIS — R1084 Generalized abdominal pain: Secondary | ICD-10-CM | POA: Diagnosis not present

## 2018-03-27 DIAGNOSIS — I481 Persistent atrial fibrillation: Secondary | ICD-10-CM | POA: Diagnosis not present

## 2018-03-27 DIAGNOSIS — I4819 Other persistent atrial fibrillation: Secondary | ICD-10-CM

## 2018-03-27 LAB — POCT INR: INR: 1.5 — AB (ref 2.0–3.0)

## 2018-03-27 MED ORDER — GABAPENTIN 300 MG PO CAPS: ORAL_CAPSULE | ORAL | 0 refills | Status: DC

## 2018-03-27 MED ORDER — WARFARIN SODIUM 5 MG PO TABS: 5.0000 mg | ORAL_TABLET | Freq: Every day | ORAL | 3 refills | Status: DC

## 2018-03-27 MED ORDER — NITROGLYCERIN 0.4 MG SL SUBL: 0.4000 mg | SUBLINGUAL_TABLET | SUBLINGUAL | 1 refills | Status: AC | PRN

## 2018-03-27 NOTE — Progress Notes (Signed)
HPI: Bethany Park is a 84 y.o. female who  has a past medical history of Anemia, Aortic stenosis, Atrial fibrillation ([place]), B12 deficiency (03/13/2018), Colon polyp (03/13/2018), Crohn disease ([place]) (03/13/2018), Dementia, Depression with anxiety (03/13/2018), Essential hypertension (03/13/2018), Glucose intolerance (03/13/2018), Heart attack ([place]), Heart failure ([place]), History of COPD, History of coronary artery stent placement (03/13/2018), HLD (hyperlipidemia) (03/13/2018), Moderate aortic stenosis (03/13/2018), Osteoporosis (03/13/2018), Ovarian cancer ([place]), Restless leg syndrome (03/13/2018), and Stroke ([place]).  she presents to [facility] today, 03/27/18,  for chief complaint of: Multiple med issues - see headings below   I take care of her daughter's family. She just moved here form [place].   Dementia with behavioral disturbance, particularly insomnia is greatest concern for patient and her daughter. Daughter manages all medicines and is aware of polypharmacy risk particularly with certain combinations   complicated by leg pain worse at night, history of restless leg syndrome.  We went up on qhs dose Seroquel last visit 03/05/18 in hopes of helping sleep/mood but pt reported hallucinations with increase of this medicine so her daughter stopped this altogether. Today 03/27/18 reported tried Lorazepam qhs for insomnia which seemed to help a little, she was out of Temazepam    Coronary artery disease, atrial fibrillation, aortic stenosis, hyperlipidemia:   Recent stent placement in December 2018. See notes in problem list. Patient is taking Coumadin, INR subtherapeutic, will adjust. Cardio recommended continue Coumadin plus Plavix given stent placement.   Butch Penny reports that patient complains occasionally of chest pain, daughter will offer to take her to the doctor's/emergency room but she will decline and the pain usually goes away in a few minutes. No  nitroglycerin tabs at home  Fall/Debility:  Patient also is a fall risk. She has some mild laceration and bruising on her face was present at last visit and is healing nicely now, recently evaluated for fall in urgent care. Daughter is trying to get her home set up with hand rails, handles in bathroom. Patient already has a bedside commode.  Abdominal pain:  Long-standing issue, history of diverticulosis and diverticulitis multiple abdominal surgeries. Patient reports sharp pain worse with movement, particularly twisting or bending forward. No recent change in abdominal pain, fev diarrhea/constipation or blood in the stool  Patient is accompanied by daughter who assists with history-taking.   Past medical, surgical, social and family history reviewed:  Patient Active Problem List   Diagnosis Date Noted  . 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 Diabetic macular edema ([place]) 03/13/2018  . Mild nonproliferative diabetic retinopathy ([place]) 03/13/2018  . Atrial fibrillation ([place]) 03/13/2018  . Essential hypertension 03/13/2018  . History of coronary artery stent placement 03/13/2018  . ASCVD (arteriosclerotic cardiovascular disease) 03/13/2018  . Moderate aortic stenosis 03/13/2018  . Dementia 03/13/2018  . HLD (hyperlipidemia) 03/13/2018  . B12 deficiency 03/13/2018  . Osteoporosis 03/13/2018  . Mild sleep apnea 03/13/2018  . Colon polyp 03/13/2018  . Restless leg syndrome 03/13/2018  . Macular degeneration 03/13/2018  . Anemia 03/13/2018  . Depression with anxiety 03/13/2018  . Overactive bladder 03/13/2018  . History of CVA (cerebrovascular accident) 03/13/2018  . Glucose intolerance 03/13/2018  . Crohn disease ([place]) 03/13/2018    Past Surgical History:  Procedure Laterality Date  . ABDOMINAL HYSTERECTOMY    . BACK SURGERY    . BREAST SURGERY    . CHOLECYSTECTOMY    . CORONARY STENT PLACEMENT    . EYE SURGERY    . HEMORROIDECTOMY    . JOINT  REPLACEMENT      Social History    Tobacco Use  . Smoking status: Never Smoker  . Smokeless tobacco: Never Used  Substance Use Topics  . Alcohol use: Not Currently    No family history on file.   Current medication list and allergy/intolerance information reviewed. Daughter brought all pill bottles to visit. Several medication lists in the record, none of which are totally congruent with one another.   Allergies  Allergen Reactions  . Alendronate Sodium   . Benztropine Mesylate [Benztropine]   . Citalopram   . Cymbalta [Duloxetine Hcl]   . Erythromycin   . Fluoxetine Hcl   . Glimepiride   . Nortriptyline Hcl   . Penicillins   . Sulfa Antibiotics       Review of Systems:obtained with assistance of family member  Constitutional:  No  fever, no chills,  +significant fatigue stable  HEENT: No  headache  Cardiac: No  chest pain, No  pressure, No palpitations,   Respiratory:  No  shortness of breath. No  Cough  Gastrointestinal: No  abdominal pain, No  nausea, No  vomiting,  No  blood in stool, No  diarrhea, No  constipation   Musculoskeletal: No new myalgia/arthralgia  Skin: No  Rash  Hem/Onc: +easy bruising/bleeding, No  abnormal lymph node  Endocrine: No cold intolerance,  No heat intolerance. No polyuria/polydipsia/polyphagia   Neurologic: +generalized weakness, No  dizziness  Psychiatric: +concerns with depression, +concerns with anxiety, +sleep problems, +mood problems - related to dementia with underlying depression issues  Exam:  BP 134/82 (BP Location: Left Arm, Patient Position: Sitting, Cuff Size: Normal)   Pulse 61   Temp 98 F (36.7 C) (Oral)   Wt 145 lb 1.6 oz (65.8 kg)   BMI 24.52 kg/m   Constitutional: VS see above. General Appearance: alert, well-developed, well-nourished, NAD  Eyes: Normal lids and conjunctive, non-icteric sclera  Ears, Nose, Mouth, Throat: MMM, Normal external inspection ears/nares/mouth/lips/gums.  Neck: No masses, trachea midline. No thyroid  enlargement. No tenderness/mass appreciated. No lymphadenopathy  Respiratory: Normal respiratory effort. no wheeze, no rhonchi, no rales  Cardiovascular: S1/S2 normal, +3-4/6 murmur, no rub/gallop auscultated. RRR in setting of Afib ?paroxysmal. No lower extremity edema, neg Homan's  Gastrointestinal: Nontender, no masses.   Musculoskeletal: Gait relatively stable with assistance of walker  Neurological: compromised balance/coordination. No tremor.  Skin: warm, dry, small laceration and bruising over the bridge of the nose   Psychiatric: Poor judgment/insight. Normal mood and affect. Oriented x person, no she is in the doctor's office,daughter is. Is off on unrelated tangents, daughter provides most of the history.   Results for orders placed or performed in visit on 03/27/18 (from the past 72 hour(s))  POCT INR     Status: Abnormal   Collection Time: 03/27/18  1:33 PM  Result Value Ref Range   INR 1.5 (A) 2.0 - 3.0      ASSESSMENT/PLAN:   Dementia with behavioral disturbance, unspecified dementia type - see pt instructions re: meds, I think given other comorbidities/pain issues, may need to involve GeriPsych to assist w/ med mgt   Persistent atrial fibrillation ([place]) - INR subtherapeutic, will adjust warfarin  - Plan: POCT INR  History of coronary artery stent placement - no appt set up yet w/ cardio   ASCVD (arteriosclerotic cardiovascular disease) - ER precautions reviewed with regard to chest pain, prescription for nitroglycerin tabs written  Generalized abdominal pain - no acute abdomen, I htink more than likely chronic issues related  to intraabdominal scar tissue, offered CT but pt and daughter decline for now  Charcot-Marie-Tooth disease - likely contirubting to leg pain/neuropathy, possible RLS complicating factor.    Patient Instructions  Coumadin:  5 mg (1 tablet) every day except Wednesday and Saturday take 7.5 mg (1.5 tablet)   Will stop  Seroquel/quetiapine  Will add Neurontin/gabapentin to help restless legs pain, help sleep - start 1 capsule at night and increase as directed   Avoid Lorazepam at night-time unless told by me to take it  If not doing better in the next few weeks, will seek a consultation with psychiatry to assist medication management   Please call Dr Jacalyn Lefevre office to set up appointment   Prescription sent for nitro tabs in case of severe chest pain - symptoms might be angina   Will see you in 1 week to recheck INR and check up on sleep/pain issues       Visit summary with medication list and pertinent instructions was printed for patient to review. All questions at time of visit were answered - patient instructed to contact office with any additional concerns. ER/RTC precautions were reviewed with the patient.   Follow-up plan: Return in about 1 week (around 04/03/2018) for recheck INR and pains .  Note: Total time spent 40 minutes, greater than 50% of the visit was spent face-to-face counseling and coordinating care for the following: The primary encounter diagnosis was Dementia with behavioral disturbance, unspecified dementia type. Diagnoses of Persistent atrial fibrillation ([place]), History of coronary artery stent placement, ASCVD (arteriosclerotic cardiovascular disease), Generalized abdominal pain, and Charcot-Marie-Tooth disease were also pertinent to this visit.Marland Kitchen  Please note: voice recognition software was used to produce this document, and typos may escape review. Please contact Dr. Sheppard Coil for any needed clarifications.     Tried to call 03/27/18 1:29 PM w/ cardio recs, no answer, no voicemail, will try again tomorrow. Assistant tried 03/27/18 nd voice mail boxes full, they're following up in a week, will try again Monday and address it at visit for sure

## 2018-03-27 NOTE — Patient Instructions (Addendum)
Coumadin:  5 mg (1 tablet) every day except Wednesday and Saturday take 7.5 mg (1.5 tablet)   Will stop Seroquel/quetiapine  Will add Neurontin/gabapentin to help restless legs pain, help sleep - start 1 capsule at night and increase as directed   Avoid Lorazepam at night-time unless told by me to take it  If not doing better in the next few weeks, will seek a consultation with psychiatry to assist medication management   Please call Dr Jacalyn Lefevre office to set up appointment   Prescription sent for nitro tabs in case of severe chest pain - symptoms might be angina   Will see you in 1 week to recheck INR and check up on sleep/pain issues

## 2018-03-28 ENCOUNTER — Encounter: Payer: Self-pay | Admitting: Osteopathic Medicine

## 2018-03-28 DIAGNOSIS — R1084 Generalized abdominal pain: Secondary | ICD-10-CM | POA: Insufficient documentation

## 2018-03-28 DIAGNOSIS — G600 Hereditary motor and sensory neuropathy: Secondary | ICD-10-CM | POA: Insufficient documentation

## 2018-03-30 DIAGNOSIS — I2510 Atherosclerotic heart disease of native coronary artery without angina pectoris: Secondary | ICD-10-CM | POA: Diagnosis not present

## 2018-03-30 DIAGNOSIS — D649 Anemia, unspecified: Secondary | ICD-10-CM | POA: Diagnosis not present

## 2018-03-30 DIAGNOSIS — E539 Vitamin B deficiency, unspecified: Secondary | ICD-10-CM | POA: Diagnosis not present

## 2018-03-30 DIAGNOSIS — J449 Chronic obstructive pulmonary disease, unspecified: Secondary | ICD-10-CM | POA: Diagnosis not present

## 2018-03-30 DIAGNOSIS — F0390 Unspecified dementia without behavioral disturbance: Secondary | ICD-10-CM | POA: Diagnosis not present

## 2018-03-30 DIAGNOSIS — I481 Persistent atrial fibrillation: Secondary | ICD-10-CM | POA: Diagnosis not present

## 2018-04-02 DIAGNOSIS — H33301 Unspecified retinal break, right eye: Secondary | ICD-10-CM | POA: Diagnosis not present

## 2018-04-02 DIAGNOSIS — H02834 Dermatochalasis of left upper eyelid: Secondary | ICD-10-CM | POA: Diagnosis not present

## 2018-04-02 DIAGNOSIS — H02831 Dermatochalasis of right upper eyelid: Secondary | ICD-10-CM | POA: Diagnosis not present

## 2018-04-02 DIAGNOSIS — H1131 Conjunctival hemorrhage, right eye: Secondary | ICD-10-CM | POA: Diagnosis not present

## 2018-04-02 DIAGNOSIS — Z961 Presence of intraocular lens: Secondary | ICD-10-CM | POA: Diagnosis not present

## 2018-04-03 ENCOUNTER — Encounter: Payer: Self-pay | Admitting: Osteopathic Medicine

## 2018-04-03 ENCOUNTER — Ambulatory Visit (INDEPENDENT_AMBULATORY_CARE_PROVIDER_SITE_OTHER): Payer: Medicare Other | Admitting: Osteopathic Medicine

## 2018-04-03 VITALS — BP 145/67 | HR 65 | Temp 98.2°F | Wt 148.9 lb

## 2018-04-03 DIAGNOSIS — F0390 Unspecified dementia without behavioral disturbance: Secondary | ICD-10-CM | POA: Diagnosis not present

## 2018-04-03 DIAGNOSIS — I350 Nonrheumatic aortic (valve) stenosis: Secondary | ICD-10-CM | POA: Diagnosis not present

## 2018-04-03 DIAGNOSIS — I480 Paroxysmal atrial fibrillation: Secondary | ICD-10-CM

## 2018-04-03 DIAGNOSIS — E539 Vitamin B deficiency, unspecified: Secondary | ICD-10-CM | POA: Diagnosis not present

## 2018-04-03 DIAGNOSIS — F0391 Unspecified dementia with behavioral disturbance: Secondary | ICD-10-CM

## 2018-04-03 DIAGNOSIS — I2510 Atherosclerotic heart disease of native coronary artery without angina pectoris: Secondary | ICD-10-CM

## 2018-04-03 DIAGNOSIS — D649 Anemia, unspecified: Secondary | ICD-10-CM | POA: Diagnosis not present

## 2018-04-03 DIAGNOSIS — J449 Chronic obstructive pulmonary disease, unspecified: Secondary | ICD-10-CM | POA: Diagnosis not present

## 2018-04-03 DIAGNOSIS — I481 Persistent atrial fibrillation: Secondary | ICD-10-CM | POA: Diagnosis not present

## 2018-04-03 DIAGNOSIS — G2581 Restless legs syndrome: Secondary | ICD-10-CM

## 2018-04-03 DIAGNOSIS — I10 Essential (primary) hypertension: Secondary | ICD-10-CM

## 2018-04-03 DIAGNOSIS — R262 Difficulty in walking, not elsewhere classified: Secondary | ICD-10-CM | POA: Diagnosis not present

## 2018-04-03 MED ORDER — MELATONIN 10 MG SL SUBL: 10.0000 mg | SUBLINGUAL_TABLET | Freq: Every day | SUBLINGUAL | 3 refills | Status: AC

## 2018-04-03 NOTE — Patient Instructions (Signed)
INR at home - home health should send me records so I can instruct on the medication changes if needed.

## 2018-04-03 NOTE — Progress Notes (Signed)
HPI: Bethany Park is a 84 y.o. female who  has a past medical history of Anemia, Aortic stenosis, Atrial fibrillation ([place]), B12 deficiency (03/13/2018), Colon polyp (03/13/2018), Crohn disease ([place]) (03/13/2018), Dementia, Depression with anxiety (03/13/2018), Essential hypertension (03/13/2018), Glucose intolerance (03/13/2018), Heart attack ([place]), Heart failure ([place]), History of COPD, History of coronary artery stent placement (03/13/2018), HLD (hyperlipidemia) (03/13/2018), Moderate aortic stenosis (03/13/2018), Osteoporosis (03/13/2018), Ovarian cancer ([place]), Restless leg syndrome (03/13/2018), and Stroke ([place]).  she presents to [facility] today, 04/03/18,  for chief complaint of: Multiple med issues - see headings below Mainly following up today for sleep/leg pain and INR  I take care of her daughter's family. She just moved here form [place].   Dementia with behavioral disturbance, particularly insomnia is greatest concern for patient and her daughter. Daughter manages all medicines and is aware of polypharmacy risk particularly with certain combinations and potential for CNS/resp depression    complicated by leg pain worse at night, history of restless leg syndrome.  We went up on qhs dose Seroquel 03/05/18 in hopes of helping sleep/mood but pt reported hallucinations with increase of this medicine so her daughter stopped this altogether. Hx mood disorder.   03/27/18 reported tried Lorazepam qhs for insomnia which seemed to help a little, she was out of Temazepam. We decided to add Gabapentin to augment RLS treatment and hopefully also help sleep w/o routine use of Benzos or side effects of Seroquel.   Gabapentin plus Melatonin now, qhs. The Melatonin, Gabapentin, Requip are helping, Lorazepam in the evenings for anxiety but not usually needed right at bedtime.    Coronary artery disease, atrial fibrillation, aortic stenosis, hyperlipidemia:   Recent stent  placement in December 2018. See notes in problem list. Patient is taking Coumadin, INR subtherapeutic last visit.  Planning to continue Coumadin plus Plavix given stent placement.    Fall/Debility:  Patient also is a fall risk. Home health coming for PT/OT. Daughter is getting home set up with hand rails, handles in bathroom. Patient already has a bedside commode.    Patient is accompanied by daughter who assists with history-taking.   Past medical, surgical, social and family history reviewed:  Patient Active Problem List   Diagnosis Date Noted  . Generalized abdominal pain 03/28/2018  . 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  . 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 Diabetic macular edema ([place]) 03/13/2018  . Mild nonproliferative diabetic retinopathy ([place]) 03/13/2018  . Atrial fibrillation ([place]) 03/13/2018  . Essential hypertension 03/13/2018  . History of coronary artery stent placement 03/13/2018  . ASCVD (arteriosclerotic cardiovascular disease) 03/13/2018  . Moderate aortic stenosis 03/13/2018  . Dementia 03/13/2018  . HLD (hyperlipidemia) 03/13/2018  . B12 deficiency 03/13/2018  . Osteoporosis 03/13/2018  . Mild sleep apnea 03/13/2018  . Colon polyp 03/13/2018  . Restless leg syndrome 03/13/2018  . Macular degeneration 03/13/2018  . Anemia 03/13/2018  . Depression with anxiety 03/13/2018  . Overactive bladder 03/13/2018  . History of CVA (cerebrovascular accident) 03/13/2018  . Glucose intolerance 03/13/2018  . Crohn disease ([place]) 03/13/2018    Past Surgical History:  Procedure Laterality Date  . ABDOMINAL HYSTERECTOMY    . BACK SURGERY    . BREAST SURGERY    . CHOLECYSTECTOMY    . CORONARY STENT PLACEMENT    . EYE SURGERY    . HEMORROIDECTOMY    . JOINT REPLACEMENT      Social History   Tobacco Use  . Smoking status: Never Smoker  . Smokeless tobacco:  Never Used  Substance Use Topics  . Alcohol use: Not Currently    No family history on  file.   Current medication list and allergy/intolerance information reviewed. Daughter brought all pill bottles to visit. Several medication lists in the record, none of which are totally congruent with one another.   Allergies  Allergen Reactions  . Alendronate Sodium   . Benztropine Mesylate [Benztropine]   . Citalopram   . Cymbalta [Duloxetine Hcl]   . Erythromycin   . Fluoxetine Hcl   . Glimepiride   . Nortriptyline Hcl   . Penicillins   . Sulfa Antibiotics   . Seroquel [Quetiapine Fumarate] Itching and Other (See Comments)    As per daughter, hallucinations.      Review of Systems:obtained with assistance of family member  Constitutional:  No  fever, no chills,  +significant fatigue stable  HEENT: No  headache  Cardiac: No  chest pain, No  pressure, No palpitations,   Respiratory:  No  shortness of breath. No  Cough  Musculoskeletal: No new myalgia/arthralgia  Hem/Onc: +easy bruising/bleeding, No  abnormal lymph node  Neurologic: +generalized weakness  Psychiatric: +concerns with depression, +concerns with anxiety,   Exam:  BP (!) 145/67 (BP Location: Left Arm, Patient Position: Sitting, Cuff Size: Normal)   Pulse 65   Temp 98.2 F (36.8 C) (Oral)   Wt 148 lb 14.4 oz (67.5 kg)   BMI 25.16 kg/m   Constitutional: VS see above. General Appearance: alert, well-developed, well-nourished, NAD  Eyes: Normal lids and conjunctive, non-icteric sclera  Ears, Nose, Mouth, Throat: MMM, Normal external inspection ears/nares/mouth/lips/gums.  Neck: No masses, trachea midline. No thyroid enlargement. No tenderness/mass appreciated. No lymphadenopathy  Respiratory: Normal respiratory effort. no wheeze, no rhonchi, no rales  Cardiovascular: S1/S2 normal, +3-4/6 murmur, no rub/gallop auscultated. Irreg Irreg rhythm, rate ok - last sounded like RRR - paroxysmal Afib. No lower extremity edema, neg Homan's  Gastrointestinal: Nontender, no masses.   Musculoskeletal: Gait  relatively stable with assistance of walker  Neurological: compromised balance/coordination. No tremor.  Psychiatric: Poor judgment/insight. Normal and calm mood and affect. Oriented x person, no she is in the doctor's office, who her daughter is. Thoughts a bit more organized today, daughter still provides most of the history.   No results found for this or any previous visit (from the past 72 hour(s)).    ASSESSMENT/PLAN: Doing well today, sleep and leg pain are improved, see me as needed, keep f/u w/ Dr Stanford Breed and will recheck in 3 months   Dementia with behavioral disturbance, unspecified dementia type  Ambulatory dysfunction  Restless leg syndrome  ASCVD (arteriosclerotic cardiovascular disease) - Plan: CANCELED: POCT INR  Paroxysmal atrial fibrillation ([place]) - Plan: INR/PT  Essential hypertension  Moderate aortic stenosis   Patient Instructions  INR at home - home health should send me records so I can instruct on the medication changes if needed.      Visit summary with medication list and pertinent instructions was printed for patient to review. All questions at time of visit were answered - patient instructed to contact office with any additional concerns. ER/RTC precautions were reviewed with the patient.   Follow-up plan: Return in about 3 months (around 07/04/2018) for recheck chronic issues and refill meds if needed - see me soner if any problems! .  Note: Total time spent 25 minutes, greater than 50% of the visit was spent face-to-face counseling and coordinating care for the following: The primary encounter diagnosis was Dementia with behavioral  disturbance, unspecified dementia type. Diagnoses of Ambulatory dysfunction, Restless leg syndrome, ASCVD (arteriosclerotic cardiovascular disease), Paroxysmal atrial fibrillation ([facility]), Essential hypertension, and Moderate aortic stenosis were also pertinent to this visit.Marland Kitchen  Please note: voice recognition software was  used to produce this document, and typos may escape review. Please contact Dr. Sheppard Coil for any needed clarifications.

## 2018-04-04 LAB — PROTIME-INR
INR: 2.2 — AB
PROTHROMBIN TIME: 22.8 s — AB (ref 9.0–11.5)

## 2018-04-06 DIAGNOSIS — I2510 Atherosclerotic heart disease of native coronary artery without angina pectoris: Secondary | ICD-10-CM | POA: Diagnosis not present

## 2018-04-06 DIAGNOSIS — J449 Chronic obstructive pulmonary disease, unspecified: Secondary | ICD-10-CM | POA: Diagnosis not present

## 2018-04-06 DIAGNOSIS — D649 Anemia, unspecified: Secondary | ICD-10-CM | POA: Diagnosis not present

## 2018-04-06 DIAGNOSIS — F0390 Unspecified dementia without behavioral disturbance: Secondary | ICD-10-CM | POA: Diagnosis not present

## 2018-04-06 DIAGNOSIS — E539 Vitamin B deficiency, unspecified: Secondary | ICD-10-CM | POA: Diagnosis not present

## 2018-04-06 DIAGNOSIS — I481 Persistent atrial fibrillation: Secondary | ICD-10-CM | POA: Diagnosis not present

## 2018-04-09 DIAGNOSIS — F0390 Unspecified dementia without behavioral disturbance: Secondary | ICD-10-CM | POA: Diagnosis not present

## 2018-04-09 DIAGNOSIS — E539 Vitamin B deficiency, unspecified: Secondary | ICD-10-CM | POA: Diagnosis not present

## 2018-04-09 DIAGNOSIS — I481 Persistent atrial fibrillation: Secondary | ICD-10-CM | POA: Diagnosis not present

## 2018-04-09 DIAGNOSIS — I2510 Atherosclerotic heart disease of native coronary artery without angina pectoris: Secondary | ICD-10-CM | POA: Diagnosis not present

## 2018-04-09 DIAGNOSIS — J449 Chronic obstructive pulmonary disease, unspecified: Secondary | ICD-10-CM | POA: Diagnosis not present

## 2018-04-09 DIAGNOSIS — D649 Anemia, unspecified: Secondary | ICD-10-CM | POA: Diagnosis not present

## 2018-04-11 ENCOUNTER — Telehealth: Payer: Self-pay

## 2018-04-11 DIAGNOSIS — E539 Vitamin B deficiency, unspecified: Secondary | ICD-10-CM | POA: Diagnosis not present

## 2018-04-11 DIAGNOSIS — I481 Persistent atrial fibrillation: Secondary | ICD-10-CM | POA: Diagnosis not present

## 2018-04-11 DIAGNOSIS — F0390 Unspecified dementia without behavioral disturbance: Secondary | ICD-10-CM | POA: Diagnosis not present

## 2018-04-11 DIAGNOSIS — D649 Anemia, unspecified: Secondary | ICD-10-CM | POA: Diagnosis not present

## 2018-04-11 DIAGNOSIS — I2510 Atherosclerotic heart disease of native coronary artery without angina pectoris: Secondary | ICD-10-CM | POA: Diagnosis not present

## 2018-04-11 DIAGNOSIS — J449 Chronic obstructive pulmonary disease, unspecified: Secondary | ICD-10-CM | POA: Diagnosis not present

## 2018-04-11 NOTE — Telephone Encounter (Signed)
Called and spoke with Gae Bon and advised of MD instructions. KG LPN

## 2018-04-11 NOTE — Telephone Encounter (Signed)
If she is feeling up to it, I don't see why not.  Given her medical issues of course, if she were to have an emergency in the air this would certainly be a risk but there is not anything inherently dangerous about air travel given her current conditions

## 2018-04-11 NOTE — Telephone Encounter (Signed)
Gae Bon called to ask if it would be ok for her mom, Bethany Park, to travel by plane.

## 2018-04-14 ENCOUNTER — Inpatient Hospital Stay (HOSPITAL_COMMUNITY)
Admission: EM | Admit: 2018-04-14 | Discharge: 2018-04-18 | DRG: 378 | Disposition: A | Discharge status: Home Health | Payer: Medicare Other | Attending: Internal Medicine | Admitting: Internal Medicine

## 2018-04-14 ENCOUNTER — Other Ambulatory Visit: Payer: Self-pay

## 2018-04-14 ENCOUNTER — Encounter (HOSPITAL_COMMUNITY): Payer: Self-pay | Admitting: Emergency Medicine

## 2018-04-14 DIAGNOSIS — F0390 Unspecified dementia without behavioral disturbance: Secondary | ICD-10-CM | POA: Diagnosis present

## 2018-04-14 DIAGNOSIS — K50911 Crohn's disease, unspecified, with rectal bleeding: Secondary | ICD-10-CM | POA: Diagnosis not present

## 2018-04-14 DIAGNOSIS — I2510 Atherosclerotic heart disease of native coronary artery without angina pectoris: Secondary | ICD-10-CM | POA: Diagnosis present

## 2018-04-14 DIAGNOSIS — D500 Iron deficiency anemia secondary to blood loss (chronic): Secondary | ICD-10-CM | POA: Diagnosis present

## 2018-04-14 DIAGNOSIS — W19XXXA Unspecified fall, initial encounter: Secondary | ICD-10-CM | POA: Diagnosis present

## 2018-04-14 DIAGNOSIS — I959 Hypotension, unspecified: Secondary | ICD-10-CM

## 2018-04-14 DIAGNOSIS — N183 Chronic kidney disease, stage 3 unspecified: Secondary | ICD-10-CM | POA: Diagnosis present

## 2018-04-14 DIAGNOSIS — D649 Anemia, unspecified: Secondary | ICD-10-CM

## 2018-04-14 DIAGNOSIS — G8929 Other chronic pain: Secondary | ICD-10-CM | POA: Diagnosis present

## 2018-04-14 DIAGNOSIS — Z66 Do not resuscitate: Secondary | ICD-10-CM | POA: Diagnosis present

## 2018-04-14 DIAGNOSIS — K922 Gastrointestinal hemorrhage, unspecified: Secondary | ICD-10-CM | POA: Diagnosis not present

## 2018-04-14 DIAGNOSIS — K921 Melena: Secondary | ICD-10-CM

## 2018-04-14 DIAGNOSIS — Z7901 Long term (current) use of anticoagulants: Secondary | ICD-10-CM

## 2018-04-14 DIAGNOSIS — I10 Essential (primary) hypertension: Secondary | ICD-10-CM | POA: Diagnosis present

## 2018-04-14 DIAGNOSIS — Z955 Presence of coronary angioplasty implant and graft: Secondary | ICD-10-CM

## 2018-04-14 DIAGNOSIS — F329 Major depressive disorder, single episode, unspecified: Secondary | ICD-10-CM | POA: Diagnosis present

## 2018-04-14 DIAGNOSIS — K5090 Crohn's disease, unspecified, without complications: Secondary | ICD-10-CM | POA: Diagnosis present

## 2018-04-14 DIAGNOSIS — K31811 Angiodysplasia of stomach and duodenum with bleeding: Secondary | ICD-10-CM | POA: Diagnosis not present

## 2018-04-14 DIAGNOSIS — R52 Pain, unspecified: Secondary | ICD-10-CM

## 2018-04-14 DIAGNOSIS — F32A Depression, unspecified: Secondary | ICD-10-CM | POA: Diagnosis present

## 2018-04-14 DIAGNOSIS — I509 Heart failure, unspecified: Secondary | ICD-10-CM | POA: Diagnosis present

## 2018-04-14 DIAGNOSIS — I6782 Cerebral ischemia: Secondary | ICD-10-CM | POA: Diagnosis not present

## 2018-04-14 DIAGNOSIS — K5730 Diverticulosis of large intestine without perforation or abscess without bleeding: Secondary | ICD-10-CM | POA: Diagnosis not present

## 2018-04-14 DIAGNOSIS — N390 Urinary tract infection, site not specified: Secondary | ICD-10-CM | POA: Diagnosis present

## 2018-04-14 DIAGNOSIS — I130 Hypertensive heart and chronic kidney disease with heart failure and stage 1 through stage 4 chronic kidney disease, or unspecified chronic kidney disease: Secondary | ICD-10-CM | POA: Diagnosis present

## 2018-04-14 DIAGNOSIS — G2581 Restless legs syndrome: Secondary | ICD-10-CM | POA: Diagnosis present

## 2018-04-14 DIAGNOSIS — I252 Old myocardial infarction: Secondary | ICD-10-CM

## 2018-04-14 DIAGNOSIS — N179 Acute kidney failure, unspecified: Secondary | ICD-10-CM | POA: Diagnosis present

## 2018-04-14 DIAGNOSIS — Z7902 Long term (current) use of antithrombotics/antiplatelets: Secondary | ICD-10-CM

## 2018-04-14 DIAGNOSIS — J449 Chronic obstructive pulmonary disease, unspecified: Secondary | ICD-10-CM | POA: Diagnosis present

## 2018-04-14 DIAGNOSIS — I4891 Unspecified atrial fibrillation: Secondary | ICD-10-CM | POA: Diagnosis present

## 2018-04-14 DIAGNOSIS — Z8673 Personal history of transient ischemic attack (TIA), and cerebral infarction without residual deficits: Secondary | ICD-10-CM

## 2018-04-14 DIAGNOSIS — E871 Hypo-osmolality and hyponatremia: Secondary | ICD-10-CM | POA: Diagnosis present

## 2018-04-14 DIAGNOSIS — D62 Acute posthemorrhagic anemia: Secondary | ICD-10-CM | POA: Diagnosis present

## 2018-04-14 DIAGNOSIS — D631 Anemia in chronic kidney disease: Secondary | ICD-10-CM | POA: Diagnosis present

## 2018-04-14 DIAGNOSIS — K449 Diaphragmatic hernia without obstruction or gangrene: Secondary | ICD-10-CM | POA: Diagnosis present

## 2018-04-14 DIAGNOSIS — E785 Hyperlipidemia, unspecified: Secondary | ICD-10-CM | POA: Diagnosis present

## 2018-04-14 DIAGNOSIS — K219 Gastro-esophageal reflux disease without esophagitis: Secondary | ICD-10-CM | POA: Diagnosis present

## 2018-04-14 DIAGNOSIS — R079 Chest pain, unspecified: Secondary | ICD-10-CM | POA: Diagnosis not present

## 2018-04-14 DIAGNOSIS — I350 Nonrheumatic aortic (valve) stenosis: Secondary | ICD-10-CM | POA: Diagnosis present

## 2018-04-14 NOTE — ED Triage Notes (Signed)
Pt from home. LEthargic today with no interest in normal daily activities. Hypotensive.  Hard to arouse in triage.  No urinary symptoms.  Pt's daughter states she has had some loose bowel movements recently.  Pt has baseline dementia.  Complaints of headache.

## 2018-04-15 ENCOUNTER — Emergency Department (HOSPITAL_COMMUNITY): Payer: Medicare Other

## 2018-04-15 ENCOUNTER — Other Ambulatory Visit: Payer: Self-pay

## 2018-04-15 ENCOUNTER — Encounter (HOSPITAL_COMMUNITY): Payer: Self-pay | Admitting: Internal Medicine

## 2018-04-15 DIAGNOSIS — G8929 Other chronic pain: Secondary | ICD-10-CM | POA: Diagnosis present

## 2018-04-15 DIAGNOSIS — N390 Urinary tract infection, site not specified: Secondary | ICD-10-CM | POA: Diagnosis present

## 2018-04-15 DIAGNOSIS — S79911A Unspecified injury of right hip, initial encounter: Secondary | ICD-10-CM | POA: Diagnosis not present

## 2018-04-15 DIAGNOSIS — M25551 Pain in right hip: Secondary | ICD-10-CM | POA: Diagnosis not present

## 2018-04-15 DIAGNOSIS — K449 Diaphragmatic hernia without obstruction or gangrene: Secondary | ICD-10-CM | POA: Diagnosis present

## 2018-04-15 DIAGNOSIS — E871 Hypo-osmolality and hyponatremia: Secondary | ICD-10-CM | POA: Diagnosis present

## 2018-04-15 DIAGNOSIS — I350 Nonrheumatic aortic (valve) stenosis: Secondary | ICD-10-CM | POA: Diagnosis not present

## 2018-04-15 DIAGNOSIS — I130 Hypertensive heart and chronic kidney disease with heart failure and stage 1 through stage 4 chronic kidney disease, or unspecified chronic kidney disease: Secondary | ICD-10-CM | POA: Diagnosis present

## 2018-04-15 DIAGNOSIS — N3000 Acute cystitis without hematuria: Secondary | ICD-10-CM | POA: Diagnosis not present

## 2018-04-15 DIAGNOSIS — Z7901 Long term (current) use of anticoagulants: Secondary | ICD-10-CM | POA: Diagnosis not present

## 2018-04-15 DIAGNOSIS — D631 Anemia in chronic kidney disease: Secondary | ICD-10-CM | POA: Diagnosis present

## 2018-04-15 DIAGNOSIS — S79912A Unspecified injury of left hip, initial encounter: Secondary | ICD-10-CM | POA: Diagnosis not present

## 2018-04-15 DIAGNOSIS — Z7902 Long term (current) use of antithrombotics/antiplatelets: Secondary | ICD-10-CM | POA: Diagnosis not present

## 2018-04-15 DIAGNOSIS — N183 Chronic kidney disease, stage 3 unspecified: Secondary | ICD-10-CM | POA: Diagnosis present

## 2018-04-15 DIAGNOSIS — F329 Major depressive disorder, single episode, unspecified: Secondary | ICD-10-CM | POA: Diagnosis present

## 2018-04-15 DIAGNOSIS — K50911 Crohn's disease, unspecified, with rectal bleeding: Secondary | ICD-10-CM | POA: Diagnosis present

## 2018-04-15 DIAGNOSIS — K31811 Angiodysplasia of stomach and duodenum with bleeding: Secondary | ICD-10-CM | POA: Diagnosis not present

## 2018-04-15 DIAGNOSIS — I509 Heart failure, unspecified: Secondary | ICD-10-CM | POA: Diagnosis present

## 2018-04-15 DIAGNOSIS — I2510 Atherosclerotic heart disease of native coronary artery without angina pectoris: Secondary | ICD-10-CM | POA: Diagnosis not present

## 2018-04-15 DIAGNOSIS — I10 Essential (primary) hypertension: Secondary | ICD-10-CM

## 2018-04-15 DIAGNOSIS — K921 Melena: Secondary | ICD-10-CM | POA: Diagnosis not present

## 2018-04-15 DIAGNOSIS — D500 Iron deficiency anemia secondary to blood loss (chronic): Secondary | ICD-10-CM | POA: Diagnosis present

## 2018-04-15 DIAGNOSIS — K5090 Crohn's disease, unspecified, without complications: Secondary | ICD-10-CM | POA: Diagnosis present

## 2018-04-15 DIAGNOSIS — Z955 Presence of coronary angioplasty implant and graft: Secondary | ICD-10-CM

## 2018-04-15 DIAGNOSIS — Z8673 Personal history of transient ischemic attack (TIA), and cerebral infarction without residual deficits: Secondary | ICD-10-CM | POA: Diagnosis not present

## 2018-04-15 DIAGNOSIS — R5383 Other fatigue: Secondary | ICD-10-CM | POA: Diagnosis not present

## 2018-04-15 DIAGNOSIS — K922 Gastrointestinal hemorrhage, unspecified: Secondary | ICD-10-CM | POA: Diagnosis not present

## 2018-04-15 DIAGNOSIS — E7800 Pure hypercholesterolemia, unspecified: Secondary | ICD-10-CM | POA: Diagnosis not present

## 2018-04-15 DIAGNOSIS — N179 Acute kidney failure, unspecified: Secondary | ICD-10-CM | POA: Diagnosis not present

## 2018-04-15 DIAGNOSIS — F0390 Unspecified dementia without behavioral disturbance: Secondary | ICD-10-CM | POA: Diagnosis present

## 2018-04-15 DIAGNOSIS — G2581 Restless legs syndrome: Secondary | ICD-10-CM | POA: Diagnosis present

## 2018-04-15 DIAGNOSIS — W19XXXA Unspecified fall, initial encounter: Secondary | ICD-10-CM | POA: Diagnosis not present

## 2018-04-15 DIAGNOSIS — D509 Iron deficiency anemia, unspecified: Secondary | ICD-10-CM | POA: Diagnosis not present

## 2018-04-15 DIAGNOSIS — I959 Hypotension, unspecified: Secondary | ICD-10-CM | POA: Diagnosis not present

## 2018-04-15 DIAGNOSIS — F32A Depression, unspecified: Secondary | ICD-10-CM | POA: Diagnosis present

## 2018-04-15 DIAGNOSIS — E785 Hyperlipidemia, unspecified: Secondary | ICD-10-CM

## 2018-04-15 DIAGNOSIS — I482 Chronic atrial fibrillation: Secondary | ICD-10-CM | POA: Diagnosis not present

## 2018-04-15 DIAGNOSIS — D62 Acute posthemorrhagic anemia: Secondary | ICD-10-CM | POA: Diagnosis not present

## 2018-04-15 DIAGNOSIS — D649 Anemia, unspecified: Secondary | ICD-10-CM | POA: Diagnosis not present

## 2018-04-15 DIAGNOSIS — I4891 Unspecified atrial fibrillation: Secondary | ICD-10-CM | POA: Diagnosis present

## 2018-04-15 DIAGNOSIS — K5730 Diverticulosis of large intestine without perforation or abscess without bleeding: Secondary | ICD-10-CM | POA: Diagnosis not present

## 2018-04-15 DIAGNOSIS — R079 Chest pain, unspecified: Secondary | ICD-10-CM | POA: Diagnosis not present

## 2018-04-15 DIAGNOSIS — I6782 Cerebral ischemia: Secondary | ICD-10-CM | POA: Diagnosis not present

## 2018-04-15 DIAGNOSIS — I480 Paroxysmal atrial fibrillation: Secondary | ICD-10-CM | POA: Diagnosis not present

## 2018-04-15 LAB — CBC
HCT: 14 % — ABNORMAL LOW (ref 36.0–46.0)
HCT: 14.6 % — ABNORMAL LOW (ref 36.0–46.0)
HEMATOCRIT: 18.7 % — AB (ref 36.0–46.0)
HEMOGLOBIN: 4.5 g/dL — AB (ref 12.0–15.0)
HEMOGLOBIN: 4.5 g/dL — AB (ref 12.0–15.0)
HEMOGLOBIN: 6.1 g/dL — AB (ref 12.0–15.0)
MCH: 29.8 pg (ref 26.0–34.0)
MCH: 32.6 pg (ref 26.0–34.0)
MCH: 32.8 pg (ref 26.0–34.0)
MCHC: 30.8 g/dL (ref 30.0–36.0)
MCHC: 32.1 g/dL (ref 30.0–36.0)
MCHC: 32.6 g/dL (ref 30.0–36.0)
MCV: 102.2 fL — AB (ref 78.0–100.0)
MCV: 105.8 fL — ABNORMAL HIGH (ref 78.0–100.0)
MCV: 91.2 fL (ref 78.0–100.0)
PLATELETS: 170 10*3/uL (ref 150–400)
PLATELETS: 172 10*3/uL (ref 150–400)
Platelets: 123 10*3/uL — ABNORMAL LOW (ref 150–400)
RBC: 1.37 MIL/uL — ABNORMAL LOW (ref 3.87–5.11)
RBC: 1.38 MIL/uL — AB (ref 3.87–5.11)
RBC: 2.05 MIL/uL — AB (ref 3.87–5.11)
RDW: 14 % (ref 11.5–15.5)
RDW: 14.3 % (ref 11.5–15.5)
RDW: 19 % — AB (ref 11.5–15.5)
WBC: 5 10*3/uL (ref 4.0–10.5)
WBC: 6.9 10*3/uL (ref 4.0–10.5)
WBC: 7.4 10*3/uL (ref 4.0–10.5)

## 2018-04-15 LAB — BASIC METABOLIC PANEL
Anion gap: 6 (ref 5–15)
Anion gap: 9 (ref 5–15)
BUN: 52 mg/dL — ABNORMAL HIGH (ref 6–20)
BUN: 57 mg/dL — ABNORMAL HIGH (ref 6–20)
CHLORIDE: 98 mmol/L — AB (ref 101–111)
CO2: 21 mmol/L — AB (ref 22–32)
CO2: 21 mmol/L — ABNORMAL LOW (ref 22–32)
CREATININE: 1.36 mg/dL — AB (ref 0.44–1.00)
CREATININE: 1.52 mg/dL — AB (ref 0.44–1.00)
Calcium: 8 mg/dL — ABNORMAL LOW (ref 8.9–10.3)
Calcium: 8.3 mg/dL — ABNORMAL LOW (ref 8.9–10.3)
Chloride: 94 mmol/L — ABNORMAL LOW (ref 101–111)
GFR calc Af Amer: 34 mL/min — ABNORMAL LOW (ref >60)
GFR calc non Af Amer: 29 mL/min — ABNORMAL LOW (ref >60)
GFR, EST AFRICAN AMERICAN: 39 mL/min — AB (ref >60)
GFR, EST NON AFRICAN AMERICAN: 34 mL/min — AB (ref >60)
GLUCOSE: 113 mg/dL — AB (ref 65–99)
Glucose, Bld: 114 mg/dL — ABNORMAL HIGH (ref 65–99)
POTASSIUM: 4.2 mmol/L (ref 3.5–5.1)
Potassium: 4.1 mmol/L (ref 3.5–5.1)
SODIUM: 125 mmol/L — AB (ref 135–145)
Sodium: 124 mmol/L — ABNORMAL LOW (ref 135–145)

## 2018-04-15 LAB — URINALYSIS, ROUTINE W REFLEX MICROSCOPIC
Bilirubin Urine: NEGATIVE
Glucose, UA: NEGATIVE mg/dL
Ketones, ur: NEGATIVE mg/dL
Nitrite: NEGATIVE
Protein, ur: NEGATIVE mg/dL
SPECIFIC GRAVITY, URINE: 1.011 (ref 1.005–1.030)
pH: 5 (ref 5.0–8.0)

## 2018-04-15 LAB — TROPONIN I: Troponin I: <0.03 ng/mL (ref <0.03)

## 2018-04-15 LAB — PROTIME-INR
INR: 1.67
Prothrombin Time: 19.6 seconds — ABNORMAL HIGH (ref 11.4–15.2)

## 2018-04-15 LAB — MRSA PCR SCREENING: MRSA BY PCR: NEGATIVE

## 2018-04-15 LAB — I-STAT TROPONIN, ED: Troponin i, poc: 0.02 ng/mL (ref 0.00–0.08)

## 2018-04-15 LAB — SODIUM, URINE, RANDOM: Sodium, Ur: 22 mmol/L

## 2018-04-15 LAB — ABO/RH: ABO/RH(D): A NEG

## 2018-04-15 LAB — POC OCCULT BLOOD, ED: FECAL OCCULT BLD: POSITIVE — AB

## 2018-04-15 LAB — PREPARE RBC (CROSSMATCH)

## 2018-04-15 LAB — APTT: aPTT: 35 seconds (ref 24–36)

## 2018-04-15 LAB — OSMOLALITY, URINE: Osmolality, Ur: 260 mOsm/kg — ABNORMAL LOW (ref 300–900)

## 2018-04-15 LAB — TSH: TSH: 3.339 u[IU]/mL (ref 0.350–4.500)

## 2018-04-15 LAB — OSMOLALITY: OSMOLALITY: 278 mosm/kg (ref 275–295)

## 2018-04-15 MED ORDER — SODIUM CHLORIDE 0.9 % IV SOLN: Freq: Once | INTRAVENOUS | Status: DC

## 2018-04-15 MED ORDER — GABAPENTIN 300 MG PO CAPS
600.0000 mg | ORAL_CAPSULE | Freq: Every day | ORAL | Status: DC
  Administered 2018-04-15 – 2018-04-17 (×3): 600 mg via ORAL
  Filled 2018-04-15 (×3): qty 2

## 2018-04-15 MED ORDER — ACETAMINOPHEN 325 MG PO TABS
650.0000 mg | ORAL_TABLET | Freq: Four times a day (QID) | ORAL | Status: DC | PRN
  Administered 2018-04-15 – 2018-04-17 (×4): 650 mg via ORAL
  Filled 2018-04-15 (×4): qty 2

## 2018-04-15 MED ORDER — ATORVASTATIN CALCIUM 40 MG PO TABS
40.0000 mg | ORAL_TABLET | Freq: Every day | ORAL | Status: DC
  Administered 2018-04-15 – 2018-04-18 (×4): 40 mg via ORAL
  Filled 2018-04-15 (×3): qty 1
  Filled 2018-04-15: qty 2

## 2018-04-15 MED ORDER — MORPHINE SULFATE (PF) 4 MG/ML IV SOLN
1.0000 mg | INTRAVENOUS | Status: DC | PRN
Start: 2018-04-15 — End: 2018-04-18
  Administered 2018-04-16 – 2018-04-18 (×7): 1 mg via INTRAVENOUS
  Filled 2018-04-15 (×8): qty 1

## 2018-04-15 MED ORDER — SODIUM CHLORIDE 0.9 % IV SOLN
1.0000 g | Freq: Three times a day (TID) | INTRAVENOUS | Status: DC
  Administered 2018-04-15: 1 g via INTRAVENOUS
  Filled 2018-04-15 (×2): qty 1

## 2018-04-15 MED ORDER — SODIUM CHLORIDE 0.9 % IV SOLN
8.0000 mg/h | INTRAVENOUS | Status: DC
  Administered 2018-04-15 – 2018-04-16 (×3): 8 mg/h via INTRAVENOUS
  Filled 2018-04-15 (×6): qty 80

## 2018-04-15 MED ORDER — NITROGLYCERIN 0.4 MG SL SUBL: 0.4000 mg | SUBLINGUAL_TABLET | SUBLINGUAL | Status: DC | PRN

## 2018-04-15 MED ORDER — ACETAMINOPHEN 650 MG RE SUPP: 650.0000 mg | Freq: Four times a day (QID) | RECTAL | Status: DC | PRN

## 2018-04-15 MED ORDER — SODIUM CHLORIDE 0.9 % IV SOLN
8.0000 mg/h | INTRAVENOUS | Status: DC
  Filled 2018-04-15: qty 80

## 2018-04-15 MED ORDER — SODIUM CHLORIDE 0.9 % IV SOLN
INTRAVENOUS | Status: DC
  Administered 2018-04-15 – 2018-04-16 (×2): via INTRAVENOUS

## 2018-04-15 MED ORDER — ONDANSETRON HCL 4 MG PO TABS
4.0000 mg | ORAL_TABLET | Freq: Four times a day (QID) | ORAL | Status: DC | PRN
  Filled 2018-04-15: qty 1

## 2018-04-15 MED ORDER — ZOLPIDEM TARTRATE 5 MG PO TABS: 5.0000 mg | ORAL_TABLET | Freq: Every evening | ORAL | Status: DC | PRN

## 2018-04-15 MED ORDER — HYDRALAZINE HCL 20 MG/ML IJ SOLN: 5.0000 mg | INTRAMUSCULAR | Status: DC | PRN

## 2018-04-15 MED ORDER — BOOST / RESOURCE BREEZE PO LIQD CUSTOM
1.0000 | Freq: Three times a day (TID) | ORAL | Status: DC
Start: 2018-04-15 — End: 2018-04-18
  Administered 2018-04-16 – 2018-04-18 (×5): 1 via ORAL

## 2018-04-15 MED ORDER — SODIUM CHLORIDE 0.9 % IV SOLN: INTRAVENOUS | Status: DC

## 2018-04-15 MED ORDER — ORAL CARE MOUTH RINSE
15.0000 mL | Freq: Two times a day (BID) | OROMUCOSAL | Status: DC
  Administered 2018-04-15 – 2018-04-18 (×5): 15 mL via OROMUCOSAL

## 2018-04-15 MED ORDER — PANTOPRAZOLE SODIUM 40 MG IV SOLR: 40.0000 mg | Freq: Two times a day (BID) | INTRAVENOUS | Status: DC

## 2018-04-15 MED ORDER — SODIUM CHLORIDE 0.9 % IV BOLUS
500.0000 mL | Freq: Once | INTRAVENOUS | Status: AC
  Administered 2018-04-15: 500 mL via INTRAVENOUS

## 2018-04-15 MED ORDER — MELATONIN 3 MG PO TABS
9.0000 mg | ORAL_TABLET | Freq: Every day | ORAL | Status: DC
  Administered 2018-04-15 – 2018-04-17 (×3): 9 mg via SUBLINGUAL
  Filled 2018-04-15 (×3): qty 3

## 2018-04-15 MED ORDER — VITAMIN K1 10 MG/ML IJ SOLN
10.0000 mg | Freq: Once | INTRAVENOUS | Status: AC
  Administered 2018-04-15: 10 mg via INTRAVENOUS
  Filled 2018-04-15: qty 1

## 2018-04-15 MED ORDER — ROPINIROLE HCL 1 MG PO TABS
2.0000 mg | ORAL_TABLET | Freq: Two times a day (BID) | ORAL | Status: DC
  Administered 2018-04-15 – 2018-04-18 (×7): 2 mg via ORAL
  Filled 2018-04-15 (×8): qty 2

## 2018-04-15 MED ORDER — CARVEDILOL 3.125 MG PO TABS: 3.1250 mg | ORAL_TABLET | Freq: Two times a day (BID) | ORAL | Status: DC

## 2018-04-15 MED ORDER — ONDANSETRON HCL 4 MG/2ML IJ SOLN: 4.0000 mg | Freq: Four times a day (QID) | INTRAMUSCULAR | Status: DC | PRN

## 2018-04-15 MED ORDER — CARVEDILOL 3.125 MG PO TABS
3.1250 mg | ORAL_TABLET | Freq: Two times a day (BID) | ORAL | Status: DC
  Administered 2018-04-15 – 2018-04-18 (×5): 3.125 mg via ORAL
  Filled 2018-04-15 (×6): qty 1

## 2018-04-15 MED ORDER — SODIUM CHLORIDE 0.9 % IV SOLN
80.0000 mg | Freq: Once | INTRAVENOUS | Status: AC
  Administered 2018-04-15: 80 mg via INTRAVENOUS
  Filled 2018-04-15: qty 80

## 2018-04-15 MED ORDER — LORAZEPAM 0.5 MG PO TABS
0.5000 mg | ORAL_TABLET | Freq: Two times a day (BID) | ORAL | Status: DC | PRN
  Administered 2018-04-15 – 2018-04-16 (×2): 0.5 mg via ORAL
  Filled 2018-04-15 (×2): qty 1

## 2018-04-15 NOTE — ED Notes (Signed)
Unable to draw blood culture X 1 attempt

## 2018-04-15 NOTE — ED Notes (Signed)
Admitting at bedside 

## 2018-04-15 NOTE — ED Notes (Signed)
Pt had first 15 minutes with no reaction   tomthe blood  Rate now 150

## 2018-04-15 NOTE — Progress Notes (Signed)
Pharmacy Antibiotic Note  Bethany Park is a 84 y.o. female admitted on 04/14/2018 with UTI.  Pharmacy has been consulted for aztreonam dosing.  Plan: Aztreonam 1gm IV q8 hours F/u cultures and clinical course     Temp (24hrs), Avg:98.1 F (36.7 C), Min:97.8 F (36.6 C), Max:98.8 F (37.1 C)  Recent Labs  Lab 04/14/18 2354  WBC 7.4  CREATININE 1.52*    Estimated Creatinine Clearance: 24.4 mL/min (A) (by C-G formula based on SCr of 1.52 mg/dL (H)).    Allergies  Allergen Reactions  . Alendronate Sodium   . Benztropine Mesylate [Benztropine]   . Citalopram   . Cymbalta [Duloxetine Hcl]   . Erythromycin   . Fluoxetine Hcl   . Glimepiride   . Nortriptyline Hcl   . Penicillins   . Sulfa Antibiotics   . Seroquel [Quetiapine Fumarate] Itching and Other (See Comments)    As per daughter, hallucinations.     Thank you for allowing pharmacy to be a part of this patient's care.  [name] 04/15/2018 5:40 AM

## 2018-04-15 NOTE — ED Notes (Signed)
Family at bedside. 

## 2018-04-15 NOTE — Consult Note (Addendum)
Consult Note for [facility]  Reason for Consult: Melena and anemia Referring Physician: [facility]  Economist HPI: This is an 84 year old female with a PMH of CVA, HTN, afib on coumadin, and hyperlipidemia admitted for a severe anemia.  The patient reports feeling weak for the past several days, but over the last month she reports black stools.  She states that she told her daughter who attributed the discoloration to something that she was eating.  Two days prior to admission she had a fall without any injury.  She denies any issues with chest pain, SOB, nausea, or vomiting. She did does report a mild RLQ pain as well as GERD.  She takes GERD medications inconsistently and there is no recent use of NSAIDs.  Her last HGB on 07/21/2016 was at 9.9 g/dL.  And her admission HGB was 4.5 g/dL.  Her home SBP and ER SBP were in the 80-90's.  Her colonoscopy on 08/06/2013, in [place] state, was revealing for a small proximal colon polyp.  In that report it stated that she also had diverticula and there was a family history of colon cancer as well as a personal history of polyps.  Past Medical History:  Diagnosis Date  . Anemia   . Aortic stenosis   . Atrial fibrillation ([place])   . B12 deficiency 03/13/2018  . Colon polyp 03/13/2018  . Crohn disease ([place]) 03/13/2018  . Dementia   . Depression with anxiety 03/13/2018  . Essential hypertension 03/13/2018  . Glucose intolerance 03/13/2018  . Heart attack ([place])   . Heart failure ([place])   . History of COPD   . History of coronary artery stent placement 03/13/2018  . HLD (hyperlipidemia) 03/13/2018  . Moderate aortic stenosis 03/13/2018  . Osteoporosis 03/13/2018  . Ovarian cancer ([place])   . Restless leg syndrome 03/13/2018  . Stroke [facility])     Past Surgical History:  Procedure Laterality Date  . ABDOMINAL HYSTERECTOMY    . BACK SURGERY    . BREAST SURGERY    . CHOLECYSTECTOMY    . CORONARY STENT PLACEMENT    . EYE SURGERY    . HEMORROIDECTOMY     . JOINT REPLACEMENT      Family History  Problem Relation Age of Onset  . Heart disease Mother   . Diabetes Mellitus II Mother   . Stroke Mother   . Colon cancer Father     Social History:  reports that she has never smoked. She has never used smokeless tobacco. She reports that she drank alcohol. She reports that she has current or past drug history.  Allergies:  Allergies  Allergen Reactions  . Alendronate Sodium   . Benztropine Mesylate [Benztropine]   . Citalopram   . Cymbalta [Duloxetine Hcl]   . Erythromycin   . Fluoxetine Hcl   . Glimepiride   . Nortriptyline Hcl   . Penicillins   . Sulfa Antibiotics   . Seroquel [Quetiapine Fumarate] Itching and Other (See Comments)    As per daughter, hallucinations.    Medications:  Scheduled: . atorvastatin  40 mg Oral Daily  . gabapentin  600 mg Oral QHS  . Melatonin  9 mg Sublingual QHS  . [START ON 04/18/2018] pantoprazole  40 mg Intravenous Q12H  . rOPINIRole  2 mg Oral BID   Continuous: . sodium chloride    . sodium chloride    . sodium chloride    . aztreonam    . pantoprozole (PROTONIX) infusion 8  mg/hr (04/15/18 0548)    Results for orders placed or performed during the hospital encounter of 04/14/18 (from the past 24 hour(s))  Urinalysis, Routine w reflex microscopic     Status: Abnormal   Collection Time: 04/14/18 11:45 PM  Result Value Ref Range   Color, Urine STRAW (A) YELLOW   APPearance CLEAR CLEAR   Specific Gravity, Urine 1.011 1.005 - 1.030   pH 5.0 5.0 - 8.0   Glucose, UA NEGATIVE NEGATIVE mg/dL   Hgb urine dipstick SMALL (A) NEGATIVE   Bilirubin Urine NEGATIVE NEGATIVE   Ketones, ur NEGATIVE NEGATIVE mg/dL   Protein, ur NEGATIVE NEGATIVE mg/dL   Nitrite NEGATIVE NEGATIVE   Leukocytes, UA MODERATE (A) NEGATIVE   RBC / HPF 0-5 0 - 5 RBC/hpf   WBC, UA 6-10 0 - 5 WBC/hpf   Bacteria, UA RARE (A) NONE SEEN   Squamous Epithelial / LPF 0-5 0 - 5  Basic metabolic panel     Status: Abnormal    Collection Time: 04/14/18 11:54 PM  Result Value Ref Range   Sodium 124 (L) 135 - 145 mmol/L   Potassium 4.1 3.5 - 5.1 mmol/L   Chloride 94 (L) 101 - 111 mmol/L   CO2 21 (L) 22 - 32 mmol/L   Glucose, Bld 113 (H) 65 - 99 mg/dL   BUN 57 (H) 6 - 20 mg/dL   Creatinine, Ser 1.52 (H) 0.44 - 1.00 mg/dL   Calcium 8.3 (L) 8.9 - 10.3 mg/dL   GFR calc non Af Amer 29 (L) >60 mL/min   GFR calc Af Amer 34 (L) >60 mL/min   Anion gap 9 5 - 15  CBC     Status: Abnormal   Collection Time: 04/14/18 11:54 PM  Result Value Ref Range   WBC 7.4 4.0 - 10.5 K/uL   RBC 1.37 (L) 3.87 - 5.11 MIL/uL   Hemoglobin 4.5 (LL) 12.0 - 15.0 g/dL   HCT 14.0 (L) 36.0 - 46.0 %   MCV 102.2 (H) 78.0 - 100.0 fL   MCH 32.8 26.0 - 34.0 pg   MCHC 32.1 30.0 - 36.0 g/dL   RDW 14.0 11.5 - 15.5 %   Platelets 172 150 - 400 K/uL  POC occult blood, ED     Status: Abnormal   Collection Time: 04/15/18  2:18 AM  Result Value Ref Range   Fecal Occult Bld POSITIVE (A) NEGATIVE  Type and screen [place]     Status: None (Preliminary result)   Collection Time: 04/15/18  3:55 AM  Result Value Ref Range   ABO/RH(D) A NEG    Antibody Screen NEG    Sample Expiration 04/18/2018    Unit Number [id number]    Blood Component Type RBC LR PHER2    Unit division 00    Status of Unit ALLOCATED    Transfusion Status OK TO TRANSFUSE    Crossmatch Result Compatible    Unit Number [id number]    Blood Component Type RBC LR PHER1    Unit division 00    Status of Unit ALLOCATED    Transfusion Status OK TO TRANSFUSE    Crossmatch Result Compatible    Unit Number [id number]    Blood Component Type RBC LR PHER1    Unit division 00    Status of Unit ISSUED    Transfusion Status OK TO TRANSFUSE    Crossmatch Result      Compatible Performed at [facility], [address],  [place] [address]   Prepare RBC     Status: None   Collection Time: 04/15/18  3:55 AM  Result Value Ref Range   Order  Confirmation      ORDER PROCESSED BY BLOOD BANK Performed at [facility], [place] [address] [place], [place] [address]   ABO/Rh     Status: None (Preliminary result)   Collection Time: 04/15/18  3:55 AM  Result Value Ref Range   ABO/RH(D)      A NEG Performed at [facility] [address] [place], [place] [address]   I-Stat Troponin, ED (not at [facility])     Status: None   Collection Time: 04/15/18  4:13 AM  Result Value Ref Range   Troponin i, poc 0.02 0.00 - 0.08 ng/mL   Comment 3          Prepare fresh frozen plasma     Status: None (Preliminary result)   Collection Time: 04/15/18  4:23 AM  Result Value Ref Range   Unit Number [id number]    Blood Component Type THW PLS APHR    Unit division A0    Status of Unit ALLOCATED    Transfusion Status OK TO TRANSFUSE    Unit Number [id number]    Blood Component Type THW PLS APHR    Unit division B0    Status of Unit ALLOCATED    Transfusion Status OK TO TRANSFUSE   Protime-INR     Status: Abnormal   Collection Time: 04/15/18  5:04 AM  Result Value Ref Range   Prothrombin Time 19.6 (H) 11.4 - 15.2 seconds   INR 1.67   Osmolality     Status: None   Collection Time: 04/15/18  5:04 AM  Result Value Ref Range   Osmolality 278 275 - 295 mOsm/kg  TSH     Status: None   Collection Time: 04/15/18  5:04 AM  Result Value Ref Range   TSH 3.339 0.350 - 4.500 uIU/mL  CBC     Status: Abnormal   Collection Time: 04/15/18  5:04 AM  Result Value Ref Range   WBC 6.9 4.0 - 10.5 K/uL   RBC 1.38 (L) 3.87 - 5.11 MIL/uL   Hemoglobin 4.5 (LL) 12.0 - 15.0 g/dL   HCT 14.6 (L) 36.0 - 46.0 %   MCV 105.8 (H) 78.0 - 100.0 fL   MCH 32.6 26.0 - 34.0 pg   MCHC 30.8 30.0 - 36.0 g/dL   RDW 14.3 11.5 - 15.5 %   Platelets 170 150 - 400 K/uL  Troponin I (q 6hr x 3)     Status: None   Collection Time: 04/15/18  5:04 AM  Result Value Ref Range   Troponin I <0.03 <0.03 ng/mL  APTT     Status: None   Collection Time: 04/15/18  5:04 AM   Result Value Ref Range   aPTT 35 24 - 36 seconds  Basic metabolic panel     Status: Abnormal   Collection Time: 04/15/18  5:04 AM  Result Value Ref Range   Sodium 125 (L) 135 - 145 mmol/L   Potassium 4.2 3.5 - 5.1 mmol/L   Chloride 98 (L) 101 - 111 mmol/L   CO2 21 (L) 22 - 32 mmol/L   Glucose, Bld 114 (H) 65 - 99 mg/dL   BUN 52 (H) 6 - 20 mg/dL   Creatinine, Ser 1.36 (H) 0.44 - 1.00 mg/dL   Calcium 8.0 (L) 8.9 - 10.3 mg/dL   GFR  calc non Af Amer 34 (L) >60 mL/min   GFR calc Af Amer 39 (L) >60 mL/min   Anion gap 6 5 - 15  Osmolality, urine     Status: Abnormal   Collection Time: 04/15/18  5:11 AM  Result Value Ref Range   Osmolality, Ur 260 (L) 300 - 900 mOsm/kg  Sodium, urine, random     Status: None   Collection Time: 04/15/18  5:11 AM  Result Value Ref Range   Sodium, Ur 22 mmol/L     Ct Abdomen Pelvis Wo Contrast  Result Date: 04/15/2018 CLINICAL DATA:  84 year old with acute abdominal pain. Lethargy. Stated history of blunt abdominal trauma. EXAM: CT ABDOMEN AND PELVIS WITHOUT CONTRAST TECHNIQUE: Multidetector CT imaging of the abdomen and pelvis was performed following the standard protocol without IV contrast. COMPARISON:  None. FINDINGS: Lower chest: Minimal subpleural reticulation in the right middle and right lower lobes. No focal consolidation. No pleural fluid. There are coronary artery calcifications. Hepatobiliary: No discrete focal hepatic lesion. Postcholecystectomy with expected postcholecystectomy biliary prominence. Common bile duct measures 9 mm. Pancreas: Few parenchymal calcifications in the distal body and tail. No ductal dilatation or inflammation. Spleen: Normal in size without focal abnormality. Adrenals/Urinary Tract: No adrenal nodule. No hydronephrosis. Mild symmetric perinephric edema. No urolithiasis. Urinary bladder is physiologically distended without wall thickening. Stomach/Bowel: Sigmoid colonic diverticulosis without diverticulitis. Moderate stool  burden in the colon. No bowel wall thickening, inflammatory change or obstruction. The appendix is not visualized. Stomach distended with ingested contents. Vascular/Lymphatic: Aortic atherosclerosis without aneurysm. No enlarged abdominal or pelvic lymph nodes. Reproductive: Post hysterectomy.  No adnexal mass. Other: Postsurgical change of the anterior abdominal wall. No free air, free fluid, or intra-abdominal fluid collection. Musculoskeletal: The bones are under mineralized. Degenerative change at the pubic symphysis. Multilevel degenerative change throughout the lumbar spine. IMPRESSION: 1. Mild symmetric bilateral perinephric edema, non-specific and may be chronic. Recommend correlation with urinalysis to exclude urinary tract infection. 2. Colonic diverticulosis without diverticulitis. 3.  Aortic Atherosclerosis (ICD10-I70.0). Electronically Signed   By: Jeb Levering M.D.   On: 04/15/2018 03:15   Dg Chest 2 View  Result Date: 04/15/2018 CLINICAL DATA:  Chest pain EXAM: CHEST - 2 VIEW COMPARISON:  None. FINDINGS: No focal opacity or pleural effusion. Mild cardiomegaly. Tortuous aorta with atherosclerosis. No pneumothorax. Right shoulder replacement. IMPRESSION: No active cardiopulmonary disease.  Mild cardiomegaly. Electronically Signed   By: Donavan Foil M.D.   On: 04/15/2018 02:46   Ct Head Wo Contrast  Result Date: 04/15/2018 CLINICAL DATA:  Hypotensive and lethargic EXAM: CT HEAD WITHOUT CONTRAST TECHNIQUE: Contiguous axial images were obtained from the base of the skull through the vertex without intravenous contrast. COMPARISON:  None. FINDINGS: Brain: No acute territorial infarction, hemorrhage or intracranial mass. Encephalomalacia in the left posterior parietal and occipital lobe superior atrophy. Mild small vessel ischemic changes of the white matter. Nonenlarged ventricles. Mild ex vacuo dilatation of the left posterior ventricle. Vascular: No hyperdense vessels.  Carotid vascular  calcification. Skull: No fracture Sinuses/Orbits: Mucosal thickening in the ethmoid sinuses. Near complete opacification of right maxillary sinus. Probable old nasal bone fracture Other: None IMPRESSION: 1. No definite CT evidence for acute intracranial abnormality. 2. Atrophy and small vessel ischemic changes of the white matter. Encephalomalacia in the left posterior parietal and occipital lobes. Electronically Signed   By: Donavan Foil M.D.   On: 04/15/2018 03:10    ROS:  As stated above in the HPI otherwise negative.  Blood pressure 102/63,  pulse 65, temperature 98.4 F (36.9 C), resp. rate 16, SpO2 100 %.    PE: Gen: NAD, Alert and Oriented HEENT:  /AT, EOMI Neck: Supple, no LAD Lungs: CTA Bilaterally CV: RRR without M/G/R ABM: Soft, NTND, +BS Ext: No C/C/E  Assessment/Plan: 1) Severe anemia. 2) Afib. 3) History of CVA.   She was ordered 3 units of PRBC.  Her vital signs have shown some improvement with fluid resuscitation.  Her current INR is at 1.6.  Her clinical history is consistent with an upper GI source of bleeding.  Her colonoscopy 4.5 years ago was not revealing for any significant lesions.  Plan: 1) EGD tomorrow with Dr. Fuller Plan. 2) Agree with the blood transfusions.  Jesten Cappuccio D 04/15/2018, 8:06 AM

## 2018-04-15 NOTE — ED Provider Notes (Signed)
Medical screening examination/treatment/procedure(s) were conducted as a shared visit with non-physician practitioner(s) and myself.  I personally evaluated the patient during the encounter.  84 year old female here with generalized weakness, falls on Coumadin.  Also with dark stools. On exam patient is a little bit disoriented and seems to be making up stories.  However does not appear to be distress.  Abdomen benign.  Vital signs stable. CBC with evidence of anemia.  Hemoccult positive.  Also with AKI.  Will type and cross for 3 units of blood, CT scan ob abdomen and CT of her head after being on blood thinners and a fall.  Will need admission.   EKG Interpretation  Date/Time:  Saturday April 14 2018 23:38:25 EDT Ventricular Rate:  66 PR Interval:  234 QRS Duration: 88 QT Interval:  396 QTC Calculation: 415 R Axis:   -23 Text Interpretation:  Sinus rhythm with 1st degree A-V block Moderate voltage criteria for LVH, may be normal variant Borderline ECG No old tracing to compare Confirmed by Merrily Pew [phone]) on 04/15/2018 3:23:09 AM      Daisy Mcneel, Corene Cornea, MD 04/15/18 0730

## 2018-04-15 NOTE — ED Notes (Signed)
To xray and c-t

## 2018-04-15 NOTE — Progress Notes (Signed)
Pt seen, admitted earlier this am by Dr.Niu, this is an 89/F with Dementia, CAD/PCI stents on plavix, H/o CVA, AFib on Coumadin , admitted with melena, weakness and falls recently -Hb 4.5 on admission with INR of 2.2, CT abd with diverticulosis, Report of colonoscopy from 2014 with diverticula and polyps -Holding Plavix and Coumadin -given FFP and getting PRBC -continue IV PPI -GI consulting, plan for EGD tomorrow -DO not suspect UTI at this time, DC aztreonam, will FU urine Cx -Interms of CAD, she thinks her stents are recent, ? 52month ago, [place] [facility] [place]

## 2018-04-15 NOTE — H&P (View-Only) (Signed)
Consult Note for [facility]  Reason for Consult: Melena and anemia Referring Physician: [facility]  Economist HPI: This is an 84 year old female with a PMH of CVA, HTN, afib on coumadin, and hyperlipidemia admitted for a severe anemia.  The patient reports feeling weak for the past several days, but over the last month she reports black stools.  She states that she told her daughter who attributed the discoloration to something that she was eating.  Two days prior to admission she had a fall without any injury.  She denies any issues with chest pain, SOB, nausea, or vomiting. She did does report a mild RLQ pain as well as GERD.  She takes GERD medications inconsistently and there is no recent use of NSAIDs.  Her last HGB on 07/21/2016 was at 9.9 g/dL.  And her admission HGB was 4.5 g/dL.  Her home SBP and ER SBP were in the 80-90's.  Her colonoscopy on 08/06/2013, in [place] state, was revealing for a small proximal colon polyp.  In that report it stated that she also had diverticula and there was a family history of colon cancer as well as a personal history of polyps.  Past Medical History:  Diagnosis Date  . Anemia   . Aortic stenosis   . Atrial fibrillation ([place])   . B12 deficiency 03/13/2018  . Colon polyp 03/13/2018  . Crohn disease ([place]) 03/13/2018  . Dementia   . Depression with anxiety 03/13/2018  . Essential hypertension 03/13/2018  . Glucose intolerance 03/13/2018  . Heart attack ([place])   . Heart failure ([place])   . History of COPD   . History of coronary artery stent placement 03/13/2018  . HLD (hyperlipidemia) 03/13/2018  . Moderate aortic stenosis 03/13/2018  . Osteoporosis 03/13/2018  . Ovarian cancer ([place])   . Restless leg syndrome 03/13/2018  . Stroke [facility])     Past Surgical History:  Procedure Laterality Date  . ABDOMINAL HYSTERECTOMY    . BACK SURGERY    . BREAST SURGERY    . CHOLECYSTECTOMY    . CORONARY STENT PLACEMENT    . EYE SURGERY    . HEMORROIDECTOMY     . JOINT REPLACEMENT      Family History  Problem Relation Age of Onset  . Heart disease Mother   . Diabetes Mellitus II Mother   . Stroke Mother   . Colon cancer Father     Social History:  reports that she has never smoked. She has never used smokeless tobacco. She reports that she drank alcohol. She reports that she has current or past drug history.  Allergies:  Allergies  Allergen Reactions  . Alendronate Sodium   . Benztropine Mesylate [Benztropine]   . Citalopram   . Cymbalta [Duloxetine Hcl]   . Erythromycin   . Fluoxetine Hcl   . Glimepiride   . Nortriptyline Hcl   . Penicillins   . Sulfa Antibiotics   . Seroquel [Quetiapine Fumarate] Itching and Other (See Comments)    As per daughter, hallucinations.    Medications:  Scheduled: . atorvastatin  40 mg Oral Daily  . gabapentin  600 mg Oral QHS  . Melatonin  9 mg Sublingual QHS  . [START ON 04/18/2018] pantoprazole  40 mg Intravenous Q12H  . rOPINIRole  2 mg Oral BID   Continuous: . sodium chloride    . sodium chloride    . sodium chloride    . aztreonam    . pantoprozole (PROTONIX) infusion 8  mg/hr (04/15/18 0548)    Results for orders placed or performed during the hospital encounter of 04/14/18 (from the past 24 hour(s))  Urinalysis, Routine w reflex microscopic     Status: Abnormal   Collection Time: 04/14/18 11:45 PM  Result Value Ref Range   Color, Urine STRAW (A) YELLOW   APPearance CLEAR CLEAR   Specific Gravity, Urine 1.011 1.005 - 1.030   pH 5.0 5.0 - 8.0   Glucose, UA NEGATIVE NEGATIVE mg/dL   Hgb urine dipstick SMALL (A) NEGATIVE   Bilirubin Urine NEGATIVE NEGATIVE   Ketones, ur NEGATIVE NEGATIVE mg/dL   Protein, ur NEGATIVE NEGATIVE mg/dL   Nitrite NEGATIVE NEGATIVE   Leukocytes, UA MODERATE (A) NEGATIVE   RBC / HPF 0-5 0 - 5 RBC/hpf   WBC, UA 6-10 0 - 5 WBC/hpf   Bacteria, UA RARE (A) NONE SEEN   Squamous Epithelial / LPF 0-5 0 - 5  Basic metabolic panel     Status: Abnormal    Collection Time: 04/14/18 11:54 PM  Result Value Ref Range   Sodium 124 (L) 135 - 145 mmol/L   Potassium 4.1 3.5 - 5.1 mmol/L   Chloride 94 (L) 101 - 111 mmol/L   CO2 21 (L) 22 - 32 mmol/L   Glucose, Bld 113 (H) 65 - 99 mg/dL   BUN 57 (H) 6 - 20 mg/dL   Creatinine, Ser 1.52 (H) 0.44 - 1.00 mg/dL   Calcium 8.3 (L) 8.9 - 10.3 mg/dL   GFR calc non Af Amer 29 (L) >60 mL/min   GFR calc Af Amer 34 (L) >60 mL/min   Anion gap 9 5 - 15  CBC     Status: Abnormal   Collection Time: 04/14/18 11:54 PM  Result Value Ref Range   WBC 7.4 4.0 - 10.5 K/uL   RBC 1.37 (L) 3.87 - 5.11 MIL/uL   Hemoglobin 4.5 (LL) 12.0 - 15.0 g/dL   HCT 14.0 (L) 36.0 - 46.0 %   MCV 102.2 (H) 78.0 - 100.0 fL   MCH 32.8 26.0 - 34.0 pg   MCHC 32.1 30.0 - 36.0 g/dL   RDW 14.0 11.5 - 15.5 %   Platelets 172 150 - 400 K/uL  POC occult blood, ED     Status: Abnormal   Collection Time: 04/15/18  2:18 AM  Result Value Ref Range   Fecal Occult Bld POSITIVE (A) NEGATIVE  Type and screen [place]     Status: None (Preliminary result)   Collection Time: 04/15/18  3:55 AM  Result Value Ref Range   ABO/RH(D) A NEG    Antibody Screen NEG    Sample Expiration 04/18/2018    Unit Number [id number]    Blood Component Type RBC LR PHER2    Unit division 00    Status of Unit ALLOCATED    Transfusion Status OK TO TRANSFUSE    Crossmatch Result Compatible    Unit Number [id number]    Blood Component Type RBC LR PHER1    Unit division 00    Status of Unit ALLOCATED    Transfusion Status OK TO TRANSFUSE    Crossmatch Result Compatible    Unit Number [id number]    Blood Component Type RBC LR PHER1    Unit division 00    Status of Unit ISSUED    Transfusion Status OK TO TRANSFUSE    Crossmatch Result      Compatible Performed at [facility], [address],  [place] [address]   Prepare RBC     Status: None   Collection Time: 04/15/18  3:55 AM  Result Value Ref Range   Order  Confirmation      ORDER PROCESSED BY BLOOD BANK Performed at [facility], [place] [address] [place], [place] [address]   ABO/Rh     Status: None (Preliminary result)   Collection Time: 04/15/18  3:55 AM  Result Value Ref Range   ABO/RH(D)      A NEG Performed at [facility] [address], [place], [place] [address]   I-Stat Troponin, ED (not at [facility])     Status: None   Collection Time: 04/15/18  4:13 AM  Result Value Ref Range   Troponin i, poc 0.02 0.00 - 0.08 ng/mL   Comment 3          Prepare fresh frozen plasma     Status: None (Preliminary result)   Collection Time: 04/15/18  4:23 AM  Result Value Ref Range   Unit Number [id number]    Blood Component Type THW PLS APHR    Unit division A0    Status of Unit ALLOCATED    Transfusion Status OK TO TRANSFUSE    Unit Number [id number]    Blood Component Type THW PLS APHR    Unit division B0    Status of Unit ALLOCATED    Transfusion Status OK TO TRANSFUSE   Protime-INR     Status: Abnormal   Collection Time: 04/15/18  5:04 AM  Result Value Ref Range   Prothrombin Time 19.6 (H) 11.4 - 15.2 seconds   INR 1.67   Osmolality     Status: None   Collection Time: 04/15/18  5:04 AM  Result Value Ref Range   Osmolality 278 275 - 295 mOsm/kg  TSH     Status: None   Collection Time: 04/15/18  5:04 AM  Result Value Ref Range   TSH 3.339 0.350 - 4.500 uIU/mL  CBC     Status: Abnormal   Collection Time: 04/15/18  5:04 AM  Result Value Ref Range   WBC 6.9 4.0 - 10.5 K/uL   RBC 1.38 (L) 3.87 - 5.11 MIL/uL   Hemoglobin 4.5 (LL) 12.0 - 15.0 g/dL   HCT 14.6 (L) 36.0 - 46.0 %   MCV 105.8 (H) 78.0 - 100.0 fL   MCH 32.6 26.0 - 34.0 pg   MCHC 30.8 30.0 - 36.0 g/dL   RDW 14.3 11.5 - 15.5 %   Platelets 170 150 - 400 K/uL  Troponin I (q 6hr x 3)     Status: None   Collection Time: 04/15/18  5:04 AM  Result Value Ref Range   Troponin I <0.03 <0.03 ng/mL  APTT     Status: None   Collection Time: 04/15/18  5:04 AM   Result Value Ref Range   aPTT 35 24 - 36 seconds  Basic metabolic panel     Status: Abnormal   Collection Time: 04/15/18  5:04 AM  Result Value Ref Range   Sodium 125 (L) 135 - 145 mmol/L   Potassium 4.2 3.5 - 5.1 mmol/L   Chloride 98 (L) 101 - 111 mmol/L   CO2 21 (L) 22 - 32 mmol/L   Glucose, Bld 114 (H) 65 - 99 mg/dL   BUN 52 (H) 6 - 20 mg/dL   Creatinine, Ser 1.36 (H) 0.44 - 1.00 mg/dL   Calcium 8.0 (L) 8.9 - 10.3 mg/dL   GFR  calc non Af Amer 34 (L) >60 mL/min   GFR calc Af Amer 39 (L) >60 mL/min   Anion gap 6 5 - 15  Osmolality, urine     Status: Abnormal   Collection Time: 04/15/18  5:11 AM  Result Value Ref Range   Osmolality, Ur 260 (L) 300 - 900 mOsm/kg  Sodium, urine, random     Status: None   Collection Time: 04/15/18  5:11 AM  Result Value Ref Range   Sodium, Ur 22 mmol/L     Ct Abdomen Pelvis Wo Contrast  Result Date: 04/15/2018 CLINICAL DATA:  84 year old with acute abdominal pain. Lethargy. Stated history of blunt abdominal trauma. EXAM: CT ABDOMEN AND PELVIS WITHOUT CONTRAST TECHNIQUE: Multidetector CT imaging of the abdomen and pelvis was performed following the standard protocol without IV contrast. COMPARISON:  None. FINDINGS: Lower chest: Minimal subpleural reticulation in the right middle and right lower lobes. No focal consolidation. No pleural fluid. There are coronary artery calcifications. Hepatobiliary: No discrete focal hepatic lesion. Postcholecystectomy with expected postcholecystectomy biliary prominence. Common bile duct measures 9 mm. Pancreas: Few parenchymal calcifications in the distal body and tail. No ductal dilatation or inflammation. Spleen: Normal in size without focal abnormality. Adrenals/Urinary Tract: No adrenal nodule. No hydronephrosis. Mild symmetric perinephric edema. No urolithiasis. Urinary bladder is physiologically distended without wall thickening. Stomach/Bowel: Sigmoid colonic diverticulosis without diverticulitis. Moderate stool  burden in the colon. No bowel wall thickening, inflammatory change or obstruction. The appendix is not visualized. Stomach distended with ingested contents. Vascular/Lymphatic: Aortic atherosclerosis without aneurysm. No enlarged abdominal or pelvic lymph nodes. Reproductive: Post hysterectomy.  No adnexal mass. Other: Postsurgical change of the anterior abdominal wall. No free air, free fluid, or intra-abdominal fluid collection. Musculoskeletal: The bones are under mineralized. Degenerative change at the pubic symphysis. Multilevel degenerative change throughout the lumbar spine. IMPRESSION: 1. Mild symmetric bilateral perinephric edema, non-specific and may be chronic. Recommend correlation with urinalysis to exclude urinary tract infection. 2. Colonic diverticulosis without diverticulitis. 3.  Aortic Atherosclerosis (ICD10-I70.0). Electronically Signed   By: Jeb Levering M.D.   On: 04/15/2018 03:15   Dg Chest 2 View  Result Date: 04/15/2018 CLINICAL DATA:  Chest pain EXAM: CHEST - 2 VIEW COMPARISON:  None. FINDINGS: No focal opacity or pleural effusion. Mild cardiomegaly. Tortuous aorta with atherosclerosis. No pneumothorax. Right shoulder replacement. IMPRESSION: No active cardiopulmonary disease.  Mild cardiomegaly. Electronically Signed   By: Donavan Foil M.D.   On: 04/15/2018 02:46   Ct Head Wo Contrast  Result Date: 04/15/2018 CLINICAL DATA:  Hypotensive and lethargic EXAM: CT HEAD WITHOUT CONTRAST TECHNIQUE: Contiguous axial images were obtained from the base of the skull through the vertex without intravenous contrast. COMPARISON:  None. FINDINGS: Brain: No acute territorial infarction, hemorrhage or intracranial mass. Encephalomalacia in the left posterior parietal and occipital lobe superior atrophy. Mild small vessel ischemic changes of the white matter. Nonenlarged ventricles. Mild ex vacuo dilatation of the left posterior ventricle. Vascular: No hyperdense vessels.  Carotid vascular  calcification. Skull: No fracture Sinuses/Orbits: Mucosal thickening in the ethmoid sinuses. Near complete opacification of right maxillary sinus. Probable old nasal bone fracture Other: None IMPRESSION: 1. No definite CT evidence for acute intracranial abnormality. 2. Atrophy and small vessel ischemic changes of the white matter. Encephalomalacia in the left posterior parietal and occipital lobes. Electronically Signed   By: Donavan Foil M.D.   On: 04/15/2018 03:10    ROS:  As stated above in the HPI otherwise negative.  Blood pressure 102/63,  pulse 65, temperature 98.4 F (36.9 C), resp. rate 16, SpO2 100 %.    PE: Gen: NAD, Alert and Oriented HEENT:  [place]/AT, EOMI Neck: Supple, no LAD Lungs: CTA Bilaterally CV: RRR without M/G/R ABM: Soft, NTND, +BS Ext: No C/C/E  Assessment/Plan: 1) Severe anemia. 2) Afib. 3) History of CVA.   She was ordered 3 units of PRBC.  Her vital signs have shown some improvement with fluid resuscitation.  Her current INR is at 1.6.  Her clinical history is consistent with an upper GI source of bleeding.  Her colonoscopy 4.5 years ago was not revealing for any significant lesions.  Plan: 1) EGD tomorrow with Dr. Fuller Plan. 2) Agree with the blood transfusions.  Derin Matthes D 04/15/2018, 8:06 AM

## 2018-04-15 NOTE — ED Notes (Signed)
Pt returned from ct

## 2018-04-15 NOTE — ED Notes (Signed)
Pt complaining of tightness in chest and pain of 5/10 in right leg. RN notified

## 2018-04-15 NOTE — ED Provider Notes (Signed)
[facility] EMERGENC[facility] DEPARTMENT Provider Note   CSN: [id number] Arrival date & time: 04/14/18  2332     History   Chief Complaint Chief Complaint  Patient presents with  . Weakness    HPI Bethany Park is a 84 y.o. female.  HPI 84 year old Caucasian female past medical history significant for CHF, hyperlipidemia, COPD, dementia, A. fib currently on Coumadin, Crohn's disease restless leg syndrome, hypertension that presents to the emergency department today with daughter at bedside for evaluation of generalized weakness, chest pain, and headache.  Patient started feeling poorly today.  Reports that 3 days ago she had a mechanical fall onto her right side.  Patient and daughter report that she did not hit her head.  Reports that today patient has had generalized weakness and fatigue.  She has not had interest in doing normal daily activities per daughter.  Daughter also states it took her blood pressure at home and her systolic was in the low 02H.  Daughter does report some loose bowel movements are dark in nature.  Denies any urinary symptoms.  Denies any vomiting.  Denies any associated fevers or chills.  Patient does report having a headache prior to arrival however this has self resolved.  She also reports some substernal chest pain that does not radiate.  Denies any associated diaphoresis, nausea or emesis.  Reports chronic shortness of breath.  She does report some generalized abdominal pain from fall.  Pt denies any fever, chill, ha, vision changes, lightheadedness, dizziness, congestion, neck pain, cough, n/v/d, urinary symptoms, hematochezia, lower extremity paresthesias.  Past Medical History:  Diagnosis Date  . Anemia   . Aortic stenosis   . Atrial fibrillation ([place])   . B12 deficiency 03/13/2018  . Colon polyp 03/13/2018  . Crohn disease ([place]) 03/13/2018  . Dementia   . Depression with anxiety 03/13/2018  . Essential hypertension 03/13/2018  . Glucose  intolerance 03/13/2018  . Heart attack ([place])   . Heart failure ([place])   . History of COPD   . History of coronary artery stent placement 03/13/2018  . HLD (hyperlipidemia) 03/13/2018  . Moderate aortic stenosis 03/13/2018  . Osteoporosis 03/13/2018  . Ovarian cancer ([place])   . Restless leg syndrome 03/13/2018  . Stroke [facility])     Patient Active Problem List   Diagnosis Date Noted  . GIB (gastrointestinal bleeding) 04/15/2018  . Anemia, blood loss 04/15/2018  . Depression 04/15/2018  . CAD (coronary artery disease) 04/15/2018  . Fall 04/15/2018  . Hyponatremia 04/15/2018  . Acute renal failure superimposed on stage 3 chronic kidney disease ([place]) 04/15/2018  . UTI (urinary tract infection) 04/15/2018  . Ambulatory dysfunction 04/03/2018  . Generalized abdominal pain 03/28/2018  . 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  . 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 Diabetic macular edema ([place]) 03/13/2018  . Mild nonproliferative diabetic retinopathy ([place]) 03/13/2018  . Atrial fibrillation ([place]) 03/13/2018  . Essential hypertension 03/13/2018  . History of coronary artery stent placement 03/13/2018  . ASCVD (arteriosclerotic cardiovascular disease) 03/13/2018  . Moderate aortic stenosis 03/13/2018  . Dementia 03/13/2018  . HLD (hyperlipidemia) 03/13/2018  . B12 deficiency 03/13/2018  . Osteoporosis 03/13/2018  . Mild sleep apnea 03/13/2018  . Colon polyp 03/13/2018  . Restless leg syndrome 03/13/2018  . Macular degeneration 03/13/2018  . Anemia 03/13/2018  . Depression with anxiety 03/13/2018  . Overactive bladder 03/13/2018  . History of CVA (cerebrovascular accident) 03/13/2018  . Glucose intolerance 03/13/2018  . Crohn disease ([place]) 03/13/2018    Past Surgical  History:  Procedure Laterality Date  . ABDOMINAL HYSTERECTOMY    . BACK SURGERY    . BREAST SURGERY    . CHOLECYSTECTOMY    . CORONARY STENT PLACEMENT    . EYE SURGERY    . HEMORROIDECTOMY    . JOINT REPLACEMENT       OB  History   None      Home Medications    Prior to Admission medications   Medication Sig Start Date End Date Taking? Authorizing Provider  atorvastatin (LIPITOR) 40 MG tablet Take 1 tablet (40 mg total) by mouth daily. 03/15/18  Yes Emeterio Reeve, DO  carvedilol (COREG) 3.125 MG tablet Take 1 tablet (3.125 mg total) by mouth 2 (two) times daily with a meal. 03/15/18  Yes Emeterio Reeve, DO  clopidogrel (PLAVIX) 75 MG tablet Take 1 tablet (75 mg total) by mouth daily. 03/15/18  Yes Emeterio Reeve, DO  famotidine (PEPCID) 20 MG tablet Take 1 tablet (20 mg total) by mouth at bedtime. 03/15/18  Yes Emeterio Reeve, DO  gabapentin (NEURONTIN) 300 MG capsule Take 1 capsule (300 mg total) by mouth at bedtime for 3 days, THEN 2 capsules (600 mg total) at bedtime. Patient taking differently: Take 2 capsules (600 mg total) at bedtime. 03/27/18 04/29/18 Yes Emeterio Reeve, DO  lisinopril (PRINIVIL,ZESTRIL) 2.5 MG tablet Take 1 tablet (2.5 mg total) by mouth daily. 03/15/18  Yes Emeterio Reeve, DO  LORazepam (ATIVAN) 0.5 MG tablet Take 1 tablet (0.5 mg total) by mouth 2 (two) times daily as needed for anxiety. DO NOT TAKE in evenings w/ Temapzepam 03/15/18  Yes Emeterio Reeve, DO  Melatonin 10 MG SUBL Place 10 mg under the tongue at bedtime. 04/03/18  Yes Emeterio Reeve, DO  nitroGLYCERIN (NITROSTAT) 0.4 MG SL tablet Place 1 tablet (0.4 mg total) under the tongue every 5 (five) minutes as needed for chest pain. If no better 10-15 mins, go to hospital 03/27/18  Yes Emeterio Reeve, DO  rOPINIRole (REQUIP) 2 MG tablet Take 1 tablet (2 mg total) by mouth 2 (two) times daily. 03/15/18  Yes Emeterio Reeve, DO  warfarin (COUMADIN) 5 MG tablet Take 1 tablet (5 mg total) by mouth daily. Except 7.5 mg Weds, Saturday Patient taking differently: Take 5-7.5 mg by mouth See admin instructions. Take 1 and 1/2 tablets on Wednesday and Saturday then take 1 tablet all the other days 03/27/18  Yes  Emeterio Reeve, DO    Family History Family History  Problem Relation Age of Onset  . Heart disease Mother   . Diabetes Mellitus II Mother   . Stroke Mother   . Colon cancer Father     Social History Social History   Tobacco Use  . Smoking status: Never Smoker  . Smokeless tobacco: Never Used  Substance Use Topics  . Alcohol use: Not Currently  . Drug use: Not Currently     Allergies   Alendronate sodium; Benztropine mesylate [benztropine]; Citalopram; Cymbalta [duloxetine hcl]; Erythromycin; Fluoxetine hcl; Glimepiride; Nortriptyline hcl; Penicillins; Sulfa antibiotics; and Seroquel [quetiapine fumarate]   Review of Systems Review of Systems  All other systems reviewed and are negative.    Physical Exam Updated Vital Signs BP (!) 97/51   Pulse (!) 57   Temp 98.2 F (36.8 C)   Resp 14   SpO2 100%   Physical Exam  Constitutional: She is oriented to person, place, and time. She appears well-developed and well-nourished.  Non-toxic appearance. No distress.  HENT:  Head: Normocephalic and atraumatic.  Nose:  Nose normal.  Mouth/Throat: Oropharynx is clear and moist.  Eyes: Pupils are equal, round, and reactive to light. Right eye exhibits no discharge. Left eye exhibits no discharge.  Conjunctive are with pallor.  Neck: Normal range of motion. Neck supple.  No c spine midline tenderness. No paraspinal tenderness. No deformities or step offs noted. Full ROM. Supple. No nuchal rigidity.     Cardiovascular: Normal rate, regular rhythm and intact distal pulses. Exam reveals no gallop and no friction rub.  Murmur heard. Pulmonary/Chest: Effort normal and breath sounds normal. No stridor. No respiratory distress. She has no wheezes. She has no rales. She exhibits no tenderness.  Abdominal: Soft. Normal appearance and bowel sounds are normal. There is generalized tenderness. There is no rigidity, no rebound, no guarding, no CVA tenderness, no tenderness at McBurney's  point and negative Murphy's sign.  No ecchymosis to abdomen.  Genitourinary:  Genitourinary Comments: Chaperone present for exam. Pt tolerated without difficulty. No external hemorrhoids or fissures noted. No pain with palpation of the rectal vault. No internal hemorrhoids noted. Soft brown stool noted in the rectal vault. No gross hematochezia.  Stool is black in nature.  Hemoccult positive.   Musculoskeletal: Normal range of motion. She exhibits no tenderness.  Lymphadenopathy:    She has no cervical adenopathy.  Neurological: She is alert and oriented to person, place, and time.  Patient is able to follow commands but does appear to be lethargic.  Answers questions however some mild confusion.  Alert and oriented to person place and time at this time.  Skin: Skin is warm and dry. Capillary refill takes less than 2 seconds. There is pallor.  Psychiatric: Her behavior is normal. Judgment and thought content normal.  Nursing note and vitals reviewed.    ED Treatments / Results  Labs (all labs ordered are listed, but only abnormal results are displayed) Labs Reviewed  BASIC METABOLIC PANEL - Abnormal; Notable for the following components:      Result Value   Sodium 124 (*)    Chloride 94 (*)    CO2 21 (*)    Glucose, Bld 113 (*)    BUN 57 (*)    Creatinine, Ser 1.52 (*)    Calcium 8.3 (*)    GFR calc non Af Amer 29 (*)    GFR calc Af Amer 34 (*)    All other components within normal limits  CBC - Abnormal; Notable for the following components:   RBC 1.37 (*)    Hemoglobin 4.5 (*)    HCT 14.0 (*)    MCV 102.2 (*)    All other components within normal limits  URINALYSIS, ROUTINE W REFLEX MICROSCOPIC - Abnormal; Notable for the following components:   Color, Urine STRAW (*)    Hgb urine dipstick SMALL (*)    Leukocytes, UA MODERATE (*)    Bacteria, UA RARE (*)    All other components within normal limits  OSMOLALITY, URINE - Abnormal; Notable for the following components:    Osmolality, Ur 260 (*)    All other components within normal limits  CBC - Abnormal; Notable for the following components:   RBC 1.38 (*)    Hemoglobin 4.5 (*)    HCT 14.6 (*)    MCV 105.8 (*)    All other components within normal limits  POC OCCULT BLOOD, ED - Abnormal; Notable for the following components:   Fecal Occult Bld POSITIVE (*)    All other components within normal limits  URINE  CULTURE  CULTURE, BLOOD (ROUTINE X 2)  CULTURE, BLOOD (ROUTINE X 2)  OSMOLALITY  SODIUM, URINE, RANDOM  APTT  PROTIME-INR  TSH  CBC  CBC  CBC  TROPONIN I  TROPONIN I  TROPONIN I  BASIC METABOLIC PANEL  CBG MONITORING, ED  I-STAT TROPONIN, ED  TYPE AND SCREEN  PREPARE RBC (CROSSMATCH)  PREPARE FRESH FROZEN PLASMA  ABO/RH    EKG EKG Interpretation  Date/Time:  Saturday April 14 2018 23:38:25 EDT Ventricular Rate:  66 PR Interval:  234 QRS Duration: 88 QT Interval:  396 QTC Calculation: 415 R Axis:   -23 Text Interpretation:  Sinus rhythm with 1st degree A-V block Moderate voltage criteria for LVH, may be normal variant Borderline ECG No old tracing to compare Confirmed by Merrily Pew [phone]) on 04/15/2018 3:23:09 AM   Radiology Ct Abdomen Pelvis Wo Contrast  Result Date: 04/15/2018 CLINICAL DATA:  84 year old with acute abdominal pain. Lethargy. Stated history of blunt abdominal trauma. EXAM: CT ABDOMEN AND PELVIS WITHOUT CONTRAST TECHNIQUE: Multidetector CT imaging of the abdomen and pelvis was performed following the standard protocol without IV contrast. COMPARISON:  None. FINDINGS: Lower chest: Minimal subpleural reticulation in the right middle and right lower lobes. No focal consolidation. No pleural fluid. There are coronary artery calcifications. Hepatobiliary: No discrete focal hepatic lesion. Postcholecystectomy with expected postcholecystectomy biliary prominence. Common bile duct measures 9 mm. Pancreas: Few parenchymal calcifications in the distal body and tail. No  ductal dilatation or inflammation. Spleen: Normal in size without focal abnormality. Adrenals/Urinary Tract: No adrenal nodule. No hydronephrosis. Mild symmetric perinephric edema. No urolithiasis. Urinary bladder is physiologically distended without wall thickening. Stomach/Bowel: Sigmoid colonic diverticulosis without diverticulitis. Moderate stool burden in the colon. No bowel wall thickening, inflammatory change or obstruction. The appendix is not visualized. Stomach distended with ingested contents. Vascular/Lymphatic: Aortic atherosclerosis without aneurysm. No enlarged abdominal or pelvic lymph nodes. Reproductive: Post hysterectomy.  No adnexal mass. Other: Postsurgical change of the anterior abdominal wall. No free air, free fluid, or intra-abdominal fluid collection. Musculoskeletal: The bones are under mineralized. Degenerative change at the pubic symphysis. Multilevel degenerative change throughout the lumbar spine. IMPRESSION: 1. Mild symmetric bilateral perinephric edema, non-specific and may be chronic. Recommend correlation with urinalysis to exclude urinary tract infection. 2. Colonic diverticulosis without diverticulitis. 3.  Aortic Atherosclerosis (ICD10-I70.0). Electronically Signed   By: Jeb Levering M.D.   On: 04/15/2018 03:15   Dg Chest 2 View  Result Date: 04/15/2018 CLINICAL DATA:  Chest pain EXAM: CHEST - 2 VIEW COMPARISON:  None. FINDINGS: No focal opacity or pleural effusion. Mild cardiomegaly. Tortuous aorta with atherosclerosis. No pneumothorax. Right shoulder replacement. IMPRESSION: No active cardiopulmonary disease.  Mild cardiomegaly. Electronically Signed   By: Donavan Foil M.D.   On: 04/15/2018 02:46   Ct Head Wo Contrast  Result Date: 04/15/2018 CLINICAL DATA:  Hypotensive and lethargic EXAM: CT HEAD WITHOUT CONTRAST TECHNIQUE: Contiguous axial images were obtained from the base of the skull through the vertex without intravenous contrast. COMPARISON:  None.  FINDINGS: Brain: No acute territorial infarction, hemorrhage or intracranial mass. Encephalomalacia in the left posterior parietal and occipital lobe superior atrophy. Mild small vessel ischemic changes of the white matter. Nonenlarged ventricles. Mild ex vacuo dilatation of the left posterior ventricle. Vascular: No hyperdense vessels.  Carotid vascular calcification. Skull: No fracture Sinuses/Orbits: Mucosal thickening in the ethmoid sinuses. Near complete opacification of right maxillary sinus. Probable old nasal bone fracture Other: None IMPRESSION: 1. No definite CT evidence for acute intracranial  abnormality. 2. Atrophy and small vessel ischemic changes of the white matter. Encephalomalacia in the left posterior parietal and occipital lobes. Electronically Signed   By: Donavan Foil M.D.   On: 04/15/2018 03:10    Procedures .Critical Care Performed by: Doristine Devoid, PA-C Authorized by: Doristine Devoid, PA-C   Critical care provider statement:    Critical care time (minutes):  55   Critical care was necessary to treat or prevent imminent or life-threatening deterioration of the following conditions: GI bleed with anemia with a hemoglobin of 4.5 requiring 3 units of blood transfusion, hypotension.   Critical care was time spent personally by me on the following activities:  Development of treatment plan with patient or surrogate, discussions with consultants, discussions with primary provider, evaluation of patient's response to treatment, examination of patient, obtaining history from patient or surrogate, ordering and performing treatments and interventions, ordering and review of laboratory studies, ordering and review of radiographic studies, pulse oximetry, re-evaluation of patient's condition and review of old charts   (including critical care time)  Medications Ordered in ED Medications  0.9 %  sodium chloride infusion (has no administration in time range)  0.9 %  sodium  chloride infusion (has no administration in time range)  0.9 %  sodium chloride infusion (has no administration in time range)  atorvastatin (LIPITOR) tablet 40 mg (has no administration in time range)  carvedilol (COREG) tablet 3.125 mg (has no administration in time range)  gabapentin (NEURONTIN) capsule 600 mg (has no administration in time range)  LORazepam (ATIVAN) tablet 0.5 mg (has no administration in time range)  Melatonin TABS 9 mg (has no administration in time range)  nitroGLYCERIN (NITROSTAT) SL tablet 0.4 mg (has no administration in time range)  rOPINIRole (REQUIP) tablet 2 mg (has no administration in time range)  pantoprazole (PROTONIX) 80 mg in sodium chloride 0.9 % 250 mL (0.32 mg/mL) infusion (8 mg/hr Intravenous New Bag/Given 04/15/18 0548)  pantoprazole (PROTONIX) injection 40 mg (has no administration in time range)  acetaminophen (TYLENOL) tablet 650 mg (has no administration in time range)    Or  acetaminophen (TYLENOL) suppository 650 mg (has no administration in time range)  ondansetron (ZOFRAN) tablet 4 mg (has no administration in time range)    Or  ondansetron (ZOFRAN) injection 4 mg (has no administration in time range)  hydrALAZINE (APRESOLINE) injection 5 mg (has no administration in time range)  zolpidem (AMBIEN) tablet 5 mg (has no administration in time range)  aztreonam (AZACTAM) 1 g in sodium chloride 0.9 % 100 mL IVPB (has no administration in time range)  pantoprazole (PROTONIX) 80 mg in sodium chloride 0.9 % 100 mL IVPB (0 mg Intravenous Stopped 04/15/18 0450)  phytonadione (VITAMIN K) 10 mg in dextrose 5 % 50 mL IVPB (0 mg Intravenous Stopped 04/15/18 0545)  sodium chloride 0.9 % bolus 500 mL (0 mLs Intravenous Stopped 04/15/18 0450)     Initial Impression / Assessment and Plan / ED Course  I have reviewed the triage vital signs and the nursing notes.  Pertinent labs & imaging results that were available during my care of the patient were reviewed by  me and considered in my medical decision making (see chart for details).     Patient presents to the emergency department today for evaluation of weakness, chest pain, abdominal pain and black stools.  Daughter at bedside reports lethargy today.  Patient is hypertensive on arrival.  She is not tachycardic.  She is afebrile.  No hypoxia  or tachypnea noted.  On exam patient is nontoxic or septic appearing.  She is mildly lethargic but answers questions and follows commands appropriately.  Heart is regular rate and rhythm.  Systolic murmur present with history of same.  Lungs clear to auscultation bilaterally.  Generalized abdominal pain without any ecchymosis.  Neurovascular intact in all extremities.  Lab work shows a hemoglobin of 4.3.  No leukocytosis.  Sodium is 125.  Patient's creatinine is elevated at 1.36 and BUN is 52.  Troponin was negative.  Hemoccult was positive.  UA shows some WBCs and rare bacteria but is nitrite negative.  Patient has no urinary symptoms.  Will not treat at this time.  I suspect the patient's symptoms are related to her anemia with a hemoglobin of 4.5.  Given patient's melena this is likely the source of her anemia.  Patient started on Protonix.  Patient with history of coronary artery disease.  This is likely symptom medic anemia and will transfuse 3 units to try to bring patient to a baseline of 8.   CT imaging of head and abdomen showed no acute findings or signs of trauma.  X-ray shows some mild cardiomegaly but no other acute findings.  EKG shows normal sinus rhythm with first-degree AV block but no other acute signs of ischemia.  Given negative troponin doubt ACS.  Consult to Dr. Blaine Hamper with hospital medicine for admission.  He has asked that we start vitamin K.  We will see patient in the ED and place admission orders.  Patient and daughter updated on plan of care.  She remains hemodynamic stable.  Blood pressure remains soft and low 563 systolic.  She was also  seen and evaluated by attending who is agreed with the above plan.    Final Clinical Impressions(s) / ED Diagnoses   Final diagnoses:  Gastrointestinal hemorrhage, unspecified gastrointestinal hemorrhage type  Anemia, unspecified type  Hypotension, unspecified hypotension type    ED Discharge Orders    None       Aaron Edelman 04/15/18 0630    Mesner, Corene Cornea, MD 04/15/18 [phone]

## 2018-04-15 NOTE — ED Notes (Signed)
Clear liquid tray ordered for pt via email

## 2018-04-15 NOTE — ED Notes (Signed)
C/o weakness and lower bp at day

## 2018-04-15 NOTE — ED Notes (Signed)
Pt called nurses station requesting requip med.  Message sent to pharmacy requesting med that was due at 10am be verified and sent.

## 2018-04-15 NOTE — H&P (Signed)
History and Physical    Bethany Park AQT:[id number] DOB: 11/21/1926 DOA: 04/14/2018  Referring MD/NP/PA:   PCP: Emeterio Reeve, DO   Patient coming from:  The patient is coming from home.  At baseline, pt is independent for most of ADL.   Chief Complaint: dark stool  HPI: Bethany Park is a 84 y.o. female with medical history significant of atrial fibrillation on Coumadin, stroke on Plavix, hypertension, hyperlipidemia, depression, GERD, who presents with dark stool.  Per patient's daughter, patient has been having intermittent dark stool for about 1 month.  She developed generalized weakness in the past several days, which has been progressively getting worse. Pt looks pale, and had fall 2 days ago without significant injury.  Patient also reports right lower quadrant abdominal pain, which is intermittent, mild, sharp, nonradiating.  She has nausea, no vomiting or diarrhea.  Patient had mild chest pain earlier, which was short lasting and has completely resolved.  Currently patient does not have chest pain, shortness of breath.  No cough, fever or chills.  Denies symptoms of UTI or unilateral weakness.  No facial droop or slurred speech.  Per her daughter, patient had low blood pressure with SBP 80s at home. Her Bp is at 90s in ED.   ED Course: pt was found to have positive FOBT, hemoglobin dropped from a 9.9 on 07/21/16 to 4.5.  Urinalysis with large amount of leukocyte, 6-10 WBC, rare bacteria and clear experience. Worsening renal function, temperature normal, no tachycardia, no tachypnea, oxygen satting 97% on room air, negative chest x-ray, negative CT head for acute intracranial abnormalities.  Patient is admitted to stepdown as inpatient.  GI, Dr. Benson Norway was consulted.   CT-head showed 1. Mild symmetric bilateral perinephric edema, non-specific and may be chronic.  2. Colonic diverticulosis without diverticulitis. 3.  Aortic Atherosclerosis (ICD10-I70.0).  Review of Systems:    General: no fevers, chills, no body weight gain, has poor appetite, has fatigue HEENT: no blurry vision, hearing changes or sore throat Respiratory: no dyspnea, coughing, wheezing CV: had chest pain, no palpitations GI: no nausea, abdominal pain, dark stool. No diarrhea, constipation, vomiting, GU: no dysuria, burning on urination, increased urinary frequency, hematuria  Ext: no leg edema Neuro: no unilateral weakness, numbness, or tingling, no vision change or hearing loss Skin: no rash, no skin tear. MSK: No muscle spasm, no deformity, no limitation of range of movement in spin Heme: No easy bruising.  Travel history: No recent long distant travel.  Allergy:  Allergies  Allergen Reactions  . Alendronate Sodium   . Benztropine Mesylate [Benztropine]   . Citalopram   . Cymbalta [Duloxetine Hcl]   . Erythromycin   . Fluoxetine Hcl   . Glimepiride   . Nortriptyline Hcl   . Penicillins   . Sulfa Antibiotics   . Seroquel [Quetiapine Fumarate] Itching and Other (See Comments)    As per daughter, hallucinations.    Past Medical History:  Diagnosis Date  . Anemia   . Aortic stenosis   . Atrial fibrillation ([place])   . B12 deficiency 03/13/2018  . Colon polyp 03/13/2018  . Crohn disease ([place]) 03/13/2018  . Dementia   . Depression with anxiety 03/13/2018  . Essential hypertension 03/13/2018  . Glucose intolerance 03/13/2018  . Heart attack ([place])   . Heart failure ([place])   . History of COPD   . History of coronary artery stent placement 03/13/2018  . HLD (hyperlipidemia) 03/13/2018  . Moderate aortic stenosis 03/13/2018  . Osteoporosis 03/13/2018  .  Ovarian cancer ([place])   . Restless leg syndrome 03/13/2018  . Stroke [facility])     Past Surgical History:  Procedure Laterality Date  . ABDOMINAL HYSTERECTOMY    . BACK SURGERY    . BREAST SURGERY    . CHOLECYSTECTOMY    . CORONARY STENT PLACEMENT    . EYE SURGERY    . HEMORROIDECTOMY    . JOINT REPLACEMENT      Social History:   reports that she has never smoked. She has never used smokeless tobacco. She reports that she drank alcohol. She reports that she has current or past drug history.  Family History:  Family History  Problem Relation Age of Onset  . Heart disease Mother   . Diabetes Mellitus II Mother   . Stroke Mother   . Colon cancer Father      Prior to Admission medications   Medication Sig Start Date End Date Taking? Authorizing Provider  atorvastatin (LIPITOR) 40 MG tablet Take 1 tablet (40 mg total) by mouth daily. 03/15/18  Yes Emeterio Reeve, DO  carvedilol (COREG) 3.125 MG tablet Take 1 tablet (3.125 mg total) by mouth 2 (two) times daily with a meal. 03/15/18  Yes Emeterio Reeve, DO  clopidogrel (PLAVIX) 75 MG tablet Take 1 tablet (75 mg total) by mouth daily. 03/15/18  Yes Emeterio Reeve, DO  famotidine (PEPCID) 20 MG tablet Take 1 tablet (20 mg total) by mouth at bedtime. 03/15/18  Yes Emeterio Reeve, DO  gabapentin (NEURONTIN) 300 MG capsule Take 1 capsule (300 mg total) by mouth at bedtime for 3 days, THEN 2 capsules (600 mg total) at bedtime. Patient taking differently: Take 2 capsules (600 mg total) at bedtime. 03/27/18 04/29/18 Yes Emeterio Reeve, DO  lisinopril (PRINIVIL,ZESTRIL) 2.5 MG tablet Take 1 tablet (2.5 mg total) by mouth daily. 03/15/18  Yes Emeterio Reeve, DO  LORazepam (ATIVAN) 0.5 MG tablet Take 1 tablet (0.5 mg total) by mouth 2 (two) times daily as needed for anxiety. DO NOT TAKE in evenings w/ Temapzepam 03/15/18  Yes Emeterio Reeve, DO  Melatonin 10 MG SUBL Place 10 mg under the tongue at bedtime. 04/03/18  Yes Emeterio Reeve, DO  nitroGLYCERIN (NITROSTAT) 0.4 MG SL tablet Place 1 tablet (0.4 mg total) under the tongue every 5 (five) minutes as needed for chest pain. If no better 10-15 mins, go to hospital 03/27/18  Yes Emeterio Reeve, DO  rOPINIRole (REQUIP) 2 MG tablet Take 1 tablet (2 mg total) by mouth 2 (two) times daily. 03/15/18  Yes Emeterio Reeve, DO  warfarin (COUMADIN) 5 MG tablet Take 1 tablet (5 mg total) by mouth daily. Except 7.5 mg Weds, Saturday Patient taking differently: Take 5-7.5 mg by mouth See admin instructions. Take 1 and 1/2 tablets on Wednesday and Saturday then take 1 tablet all the other days 03/27/18  Yes Emeterio Reeve, DO    Physical Exam: Vitals:   04/15/18 0215 04/15/18 0430 04/15/18 0513 04/15/18 0513  BP: (!) 90/38 110/62 (!) 100/49   Pulse:  69    Resp: 20 16 18    Temp:    97.8 F (36.6 C)  TempSrc:      SpO2:  100%     General: Not in acute distress HEENT:       Eyes: PERRL, EOMI, no scleral icterus.       ENT: No discharge from the ears and nose, no pharynx injection, no tonsillar enlargement.        Neck: No JVD, no bruit, no  mass felt. Heme: No neck lymph node enlargement. Cardiac: W4/M6, RRR, 2/6 systolic murmurs, No gallops or rubs. Respiratory: No rales, wheezing, rhonchi or rubs. GI: Soft, nondistended, has tenderness in RLQ, no rebound pain, no organomegaly, BS present. GU: No hematuria Ext: No pitting leg edema bilaterally. 2+DP/PT pulse bilaterally. Musculoskeletal: No joint deformities, No joint redness or warmth, no limitation of ROM in spin. Skin: No rashes.  Neuro: Alert, oriented X3, cranial nerves II-XII grossly intact, moves all extremities normally.  Psych: Patient is not psychotic, no suicidal or hemocidal ideation.  Labs on Admission: I have personally reviewed following labs and imaging studies  CBC: Recent Labs  Lab 04/14/18 2354  WBC 7.4  HGB 4.5*  HCT 14.0*  MCV 102.2*  PLT 286   Basic Metabolic Panel: Recent Labs  Lab 04/14/18 2354  NA 124*  K 4.1  CL 94*  CO2 21*  GLUCOSE 113*  BUN 57*  CREATININE 1.52*  CALCIUM 8.3*   GFR: Estimated Creatinine Clearance: 24.4 mL/min (A) (by C-G formula based on SCr of 1.52 mg/dL (H)). Liver Function Tests: No results for input(s): AST, ALT, ALKPHOS, BILITOT, PROT, ALBUMIN in the last 168 hours. No  results for input(s): LIPASE, AMYLASE in the last 168 hours. No results for input(s): AMMONIA in the last 168 hours. Coagulation Profile: No results for input(s): INR, PROTIME in the last 168 hours. Cardiac Enzymes: No results for input(s): CKTOTAL, CKMB, CKMBINDEX, TROPONINI in the last 168 hours. BNP (last 3 results) No results for input(s): PROBNP in the last 8760 hours. HbA1C: No results for input(s): HGBA1C in the last 72 hours. CBG: No results for input(s): GLUCAP in the last 168 hours. Lipid Profile: No results for input(s): CHOL, HDL, LDLCALC, TRIG, CHOLHDL, LDLDIRECT in the last 72 hours. Thyroid Function Tests: No results for input(s): TSH, T4TOTAL, FREET4, T3FREE, THYROIDAB in the last 72 hours. Anemia Panel: No results for input(s): VITAMINB12, FOLATE, FERRITIN, TIBC, IRON, RETICCTPCT in the last 72 hours. Urine analysis:    Component Value Date/Time   COLORURINE STRAW (A) 04/14/2018 2345   APPEARANCEUR CLEAR 04/14/2018 2345   LABSPEC 1.011 04/14/2018 2345   PHURINE 5.0 04/14/2018 2345   GLUCOSEU NEGATIVE 04/14/2018 2345   HGBUR SMALL (A) 04/14/2018 2345   BILIRUBINUR NEGATIVE 04/14/2018 2345   KETONESUR NEGATIVE 04/14/2018 2345   PROTEINUR NEGATIVE 04/14/2018 2345   NITRITE NEGATIVE 04/14/2018 2345   LEUKOCYTESUR MODERATE (A) 04/14/2018 2345   Sepsis Labs: @LABRCNTIP (procalcitonin:4,lacticidven:4) )No results found for this or any previous visit (from the past 240 hour(s)).   Radiological Exams on Admission: Ct Abdomen Pelvis Wo Contrast  Result Date: 04/15/2018 CLINICAL DATA:  84 year old with acute abdominal pain. Lethargy. Stated history of blunt abdominal trauma. EXAM: CT ABDOMEN AND PELVIS WITHOUT CONTRAST TECHNIQUE: Multidetector CT imaging of the abdomen and pelvis was performed following the standard protocol without IV contrast. COMPARISON:  None. FINDINGS: Lower chest: Minimal subpleural reticulation in the right middle and right lower lobes. No focal  consolidation. No pleural fluid. There are coronary artery calcifications. Hepatobiliary: No discrete focal hepatic lesion. Postcholecystectomy with expected postcholecystectomy biliary prominence. Common bile duct measures 9 mm. Pancreas: Few parenchymal calcifications in the distal body and tail. No ductal dilatation or inflammation. Spleen: Normal in size without focal abnormality. Adrenals/Urinary Tract: No adrenal nodule. No hydronephrosis. Mild symmetric perinephric edema. No urolithiasis. Urinary bladder is physiologically distended without wall thickening. Stomach/Bowel: Sigmoid colonic diverticulosis without diverticulitis. Moderate stool burden in the colon. No bowel wall thickening, inflammatory change or obstruction.  The appendix is not visualized. Stomach distended with ingested contents. Vascular/Lymphatic: Aortic atherosclerosis without aneurysm. No enlarged abdominal or pelvic lymph nodes. Reproductive: Post hysterectomy.  No adnexal mass. Other: Postsurgical change of the anterior abdominal wall. No free air, free fluid, or intra-abdominal fluid collection. Musculoskeletal: The bones are under mineralized. Degenerative change at the pubic symphysis. Multilevel degenerative change throughout the lumbar spine. IMPRESSION: 1. Mild symmetric bilateral perinephric edema, non-specific and may be chronic. Recommend correlation with urinalysis to exclude urinary tract infection. 2. Colonic diverticulosis without diverticulitis. 3.  Aortic Atherosclerosis (ICD10-I70.0). Electronically Signed   By: Jeb Levering M.D.   On: 04/15/2018 03:15   Dg Chest 2 View  Result Date: 04/15/2018 CLINICAL DATA:  Chest pain EXAM: CHEST - 2 VIEW COMPARISON:  None. FINDINGS: No focal opacity or pleural effusion. Mild cardiomegaly. Tortuous aorta with atherosclerosis. No pneumothorax. Right shoulder replacement. IMPRESSION: No active cardiopulmonary disease.  Mild cardiomegaly. Electronically Signed   By: Donavan Foil  M.D.   On: 04/15/2018 02:46   Ct Head Wo Contrast  Result Date: 04/15/2018 CLINICAL DATA:  Hypotensive and lethargic EXAM: CT HEAD WITHOUT CONTRAST TECHNIQUE: Contiguous axial images were obtained from the base of the skull through the vertex without intravenous contrast. COMPARISON:  None. FINDINGS: Brain: No acute territorial infarction, hemorrhage or intracranial mass. Encephalomalacia in the left posterior parietal and occipital lobe superior atrophy. Mild small vessel ischemic changes of the white matter. Nonenlarged ventricles. Mild ex vacuo dilatation of the left posterior ventricle. Vascular: No hyperdense vessels.  Carotid vascular calcification. Skull: No fracture Sinuses/Orbits: Mucosal thickening in the ethmoid sinuses. Near complete opacification of right maxillary sinus. Probable old nasal bone fracture Other: None IMPRESSION: 1. No definite CT evidence for acute intracranial abnormality. 2. Atrophy and small vessel ischemic changes of the white matter. Encephalomalacia in the left posterior parietal and occipital lobes. Electronically Signed   By: Donavan Foil M.D.   On: 04/15/2018 03:10     EKG: Independently reviewed.  Sinus rhythm, QTC 415, T wave inversion in lead III, nonspecific T wave change.  Assessment/Plan Principal Problem:   GIB (gastrointestinal bleeding) Active Problems:   Atrial fibrillation (HCC)   Essential hypertension   History of coronary artery stent placement   HLD (hyperlipidemia)   History of CVA (cerebrovascular accident)   Anemia, blood loss   Depression   CAD (coronary artery disease)   Fall   Hyponatremia   Acute renal failure superimposed on stage 3 chronic kidney disease (HCC)   UTI (urinary tract infection)   GIB and symptomatic anemia due to blood loss: Hemoglobin 4.5 on admission.  Initially hypotensive at home, responded to IV fluid.  Currently hemodynamically stable.  Patient is taking Coumadin, INR is pending.  She is also on Plavix.   Patient had colonoscopy on 08/06/2013, which showed diverticulosis and polyps.  She states that she did not have EGD in the past.  Patient has RLQ abdominal pain, but CT abdomen/pelvis diverticulosis, but noshowed signs of diverticulitis. GI, dr. Benson Norway was consulted, will see pt in AM.  - will admit to SUD as inpt - hold off Coumadin the Plavix - transfuse 2 units of blood now - Transfuse 2 U of FFp - give 10 mg of Vk by IV - GI consulted by Ed, will follow up recommendations - NPO - IVF: 500 cc of NS bolus, then at 125 mL/hr - Start IV pantoprazole gtt - Zofran IV for nausea and morphine for pain - Avoid NSAIDs and SQ heparin -  Maintain IV access (2 large bore IVs if possible). - Monitor closely and follow q6h cbc, transfuse as necessary, if Hgb<7.0 - LaB: INR, PTT and type screen  Atrial Fibrillation: CHA2DS2-VASc Score is ,7 needs oral anticoagulation. Patient is on Coumadin at home. INR is pending on admission. Heart rate is well controlled.  Patient is bradycardic currently -hold off coumadin -hold Coreg due to hypotension   Essential hypertension: -Hold blood pressure medications due to hypotension -IV hydralazine PRN  History of coronary artery stent placement: Patient had mild chest pain earlier, which was short lasting and has completely resolved. -continue Lipitor -trop x 3 -PRN morphine and nitroglycerin  HLD (hyperlipidemia) - Lipitor;  History of CVA (cerebrovascular accident): -Continue Lipitor -Hold Plavix and Coumadin  Depression: -Continue Requip  Fall: CT of the head negative. -PT/OT when able to (not ordered yet)  Hyponatremia: Na 124.  Mental status is normal.   likely due to poor oral intake and dehydration. - Will check urine sodium, urine osmolality, serum osmolality. - check TSH - IVF: 500 cc NS in ED, will continue with IV normal saline at 125 mL/h  AoCKD-III: Baseline Cre is 1.3, pt's Cre is 1.52 and BUN 57 on admission. Likely due to prerenal  secondary to dehydration and continuation of ACEI. AT is also possible given hypotension at home - IVF as above - Follow up renal function by BMP - hold lisinopril   Possible UTI:  Urinalysis with large amount of leukocyte, 6-10 WBC, rare bacteria and clear experience. CT scan showed mild symmetric bilateral perinephric edema. Pt has renal function, I will treat patient presumed as UTI. -Aztreonam -Blood culture and urine culture  DVT ppx: SCD Code Status: DNR (I discussed with patient in the presence of her daughter, and explained the meaning of CODE STATUS. Patient wants to be DNR). Family Communication:  Yes, patient's dauhter  at bed side Disposition Plan:  Anticipate discharge back to previous home environment Consults called:  GI, Dr. Ronalee Red Admission status:   SDU/inpation       Date of Service 04/15/2018    Ivor Costa [facility] Pager [phone]  If 7PM-7AM, please contact night-coverage [email or web address] Password [facility] 04/15/2018, 5:32 AM ;

## 2018-04-15 NOTE — ED Notes (Signed)
Nurse starting IV and will collect labs.

## 2018-04-16 ENCOUNTER — Encounter (HOSPITAL_COMMUNITY)
Admission: EM | Disposition: A | Discharge status: Home Health | Payer: Self-pay | Source: Home / Self Care | Attending: Internal Medicine

## 2018-04-16 ENCOUNTER — Encounter (HOSPITAL_COMMUNITY): Payer: Self-pay | Admitting: *Deleted

## 2018-04-16 ENCOUNTER — Telehealth: Payer: Self-pay

## 2018-04-16 ENCOUNTER — Inpatient Hospital Stay (HOSPITAL_COMMUNITY): Payer: Medicare Other

## 2018-04-16 ENCOUNTER — Inpatient Hospital Stay (HOSPITAL_COMMUNITY): Payer: Medicare Other | Admitting: Certified Registered Nurse Anesthetist

## 2018-04-16 DIAGNOSIS — K31811 Angiodysplasia of stomach and duodenum with bleeding: Principal | ICD-10-CM

## 2018-04-16 DIAGNOSIS — D649 Anemia, unspecified: Secondary | ICD-10-CM

## 2018-04-16 DIAGNOSIS — K921 Melena: Secondary | ICD-10-CM

## 2018-04-16 HISTORY — PX: HOT HEMOSTASIS: SHX5433

## 2018-04-16 HISTORY — PX: ESOPHAGOGASTRODUODENOSCOPY: SHX5428

## 2018-04-16 LAB — CBC WITH DIFFERENTIAL/PLATELET
Abs Immature Granulocytes: 0.1 10*3/uL (ref 0.0–0.1)
Basophils Absolute: 0 10*3/uL (ref 0.0–0.1)
Basophils Relative: 0 %
EOS ABS: 0.2 10*3/uL (ref 0.0–0.7)
EOS PCT: 3 %
HEMATOCRIT: 22.2 % — AB (ref 36.0–46.0)
Hemoglobin: 7.5 g/dL — ABNORMAL LOW (ref 12.0–15.0)
Immature Granulocytes: 1 %
Lymphocytes Relative: 10 %
Lymphs Abs: 0.6 10*3/uL — ABNORMAL LOW (ref 0.7–4.0)
MCH: 30.2 pg (ref 26.0–34.0)
MCHC: 33.8 g/dL (ref 30.0–36.0)
MCV: 89.5 fL (ref 78.0–100.0)
MONO ABS: 0.6 10*3/uL (ref 0.1–1.0)
MONOS PCT: 9 %
Neutro Abs: 4.7 10*3/uL (ref 1.7–7.7)
Neutrophils Relative %: 77 %
Platelets: 176 10*3/uL (ref 150–400)
RBC: 2.48 MIL/uL — ABNORMAL LOW (ref 3.87–5.11)
RDW: 19.7 % — AB (ref 11.5–15.5)
WBC: 6.1 10*3/uL (ref 4.0–10.5)

## 2018-04-16 LAB — PREPARE RBC (CROSSMATCH)

## 2018-04-16 LAB — BPAM FFP
Blood Product Expiration Date: 201906192359
Blood Product Expiration Date: 201906192359
ISSUE DATE / TIME: 201906161536
ISSUE DATE / TIME: 201906161954
UNIT TYPE AND RH: 6200
Unit Type and Rh: 6200

## 2018-04-16 LAB — PREPARE FRESH FROZEN PLASMA

## 2018-04-16 LAB — BASIC METABOLIC PANEL
Anion gap: 7 (ref 5–15)
BUN: 40 mg/dL — ABNORMAL HIGH (ref 6–20)
CO2: 22 mmol/L (ref 22–32)
Calcium: 8.6 mg/dL — ABNORMAL LOW (ref 8.9–10.3)
Chloride: 106 mmol/L (ref 101–111)
Creatinine, Ser: 1.18 mg/dL — ABNORMAL HIGH (ref 0.44–1.00)
GFR calc Af Amer: 46 mL/min — ABNORMAL LOW (ref >60)
GFR calc non Af Amer: 40 mL/min — ABNORMAL LOW (ref >60)
Glucose, Bld: 103 mg/dL — ABNORMAL HIGH (ref 65–99)
Potassium: 4.1 mmol/L (ref 3.5–5.1)
Sodium: 135 mmol/L (ref 135–145)

## 2018-04-16 LAB — URINE CULTURE

## 2018-04-16 SURGERY — EGD (ESOPHAGOGASTRODUODENOSCOPY)
Anesthesia: Monitor Anesthesia Care

## 2018-04-16 MED ORDER — PROPOFOL 500 MG/50ML IV EMUL
INTRAVENOUS | Status: DC | PRN
  Administered 2018-04-16: 75 ug/kg/min via INTRAVENOUS

## 2018-04-16 MED ORDER — PROPOFOL 10 MG/ML IV BOLUS
INTRAVENOUS | Status: DC | PRN
Start: 2018-04-16 — End: 2018-04-16
  Administered 2018-04-16 (×2): 10 mg via INTRAVENOUS
  Administered 2018-04-16: 20 mg via INTRAVENOUS

## 2018-04-16 MED ORDER — PHENYLEPHRINE HCL 10 MG/ML IJ SOLN
INTRAMUSCULAR | Status: DC | PRN
  Administered 2018-04-16 (×2): 40 ug via INTRAVENOUS

## 2018-04-16 MED ORDER — SODIUM CHLORIDE 0.9 % IV SOLN
Freq: Once | INTRAVENOUS | Status: AC
  Administered 2018-04-16: 12:00:00 via INTRAVENOUS

## 2018-04-16 MED ORDER — PANTOPRAZOLE SODIUM 40 MG IV SOLR
40.0000 mg | Freq: Every day | INTRAVENOUS | Status: DC
  Administered 2018-04-17: 40 mg via INTRAVENOUS
  Filled 2018-04-16: qty 40

## 2018-04-16 MED ORDER — LACTATED RINGERS IV SOLN
INTRAVENOUS | Status: AC | PRN
  Administered 2018-04-16: 10:00:00 via INTRAVENOUS
  Administered 2018-04-16: 1000 mL via INTRAVENOUS

## 2018-04-16 MED ORDER — FUROSEMIDE 10 MG/ML IJ SOLN
20.0000 mg | Freq: Once | INTRAMUSCULAR | Status: AC
  Administered 2018-04-16: 20 mg via INTRAVENOUS
  Filled 2018-04-16: qty 2

## 2018-04-16 MED ORDER — MUSCLE RUB 10-15 % EX CREA
TOPICAL_CREAM | CUTANEOUS | Status: DC | PRN
  Administered 2018-04-17: 23:00:00 via TOPICAL
  Filled 2018-04-16: qty 85

## 2018-04-16 NOTE — Anesthesia Postprocedure Evaluation (Signed)
Anesthesia Post Note  Patient: Bethany Park  Procedure(s) Performed: ESOPHAGOGASTRODUODENOSCOPY (EGD) (N/A ) HOT HEMOSTASIS (ARGON PLASMA COAGULATION/BICAP) (N/A )     Patient location during evaluation: PACU Anesthesia Type: MAC Level of consciousness: awake and alert Pain management: pain level controlled Vital Signs Assessment: post-procedure vital signs reviewed and stable Respiratory status: spontaneous breathing, nonlabored ventilation, respiratory function stable and patient connected to nasal cannula oxygen Cardiovascular status: stable and blood pressure returned to baseline Postop Assessment: no apparent nausea or vomiting Anesthetic complications: no    Last Vitals:  Vitals:   04/16/18 1209 04/16/18 1239  BP: (!) 110/94 (!) 119/53  Pulse: 63 62  Resp: 18 (!) 21  Temp: 36.9 C 36.6 C  SpO2: 95% 95%    Last Pain:  Vitals:   04/16/18 1239  TempSrc: Oral  PainSc:                  Montez Hageman

## 2018-04-16 NOTE — Telephone Encounter (Signed)
Sounds good - whatever her doctors there discharge her on is fine, come see me when out of the hospital!

## 2018-04-16 NOTE — Op Note (Signed)
[facility] Patient Name: Bethany Park Procedure Date : 04/16/2018 MRN: [id number] Attending MD: Ladene Artist , MD Date of Birth: 01-01-1927 CSN: [id number] Age: 84 Admit Type: Inpatient Procedure:                Upper GI endoscopy Indications:              Melena Providers:                Pricilla Riffle. Fuller Plan, MD, Cleda Daub, RN, Cherylynn Ridges, Technician, Trixie Deis, CRNA Referring MD:             [facility] Medicines:                Monitored Anesthesia Care Complications:            No immediate complications. Estimated Blood Loss:     Estimated blood loss was minimal. Procedure:                Pre-Anesthesia Assessment:                           - Prior to the procedure, a History and Physical                            was performed, and patient medications and                            allergies were reviewed. The patient's tolerance of                            previous anesthesia was also reviewed. The risks                            and benefits of the procedure and the sedation                            options and risks were discussed with the patient.                            All questions were answered, and informed consent                            was obtained. Prior Anticoagulants: The patient has                            taken Coumadin (warfarin), last dose was 3 days                            prior to procedure. ASA Grade Assessment: III - A                            patient with severe systemic disease. After  reviewing the risks and benefits, the patient was                            deemed in satisfactory condition to undergo the                            procedure.                           After obtaining informed consent, the endoscope was                            passed under direct vision. Throughout the                            procedure, the patient's blood  pressure, pulse, and                            oxygen saturations were monitored continuously. The                            [id number] ([id number]) scope was introduced through the                            mouth, and advanced to the second part of duodenum.                            The upper GI endoscopy was accomplished without                            difficulty. The patient tolerated the procedure                            well. Scope In: Scope Out: Findings:      The examined esophagus was normal.      A small hiatal hernia was present.      Three medium bleeding angiodysplastic lesions were found in the cardia       in the [facility]. Coagulation for hemostasis using argon plasma was successful.      The exam of the stomach was otherwise normal.      The duodenal bulb and second portion of the duodenum were normal. Impression:               - Normal esophagus.                           - Small hiatal hernia.                           - Three bleeding angiodysplastic lesions in the                            gastric cardia. Treated with argon plasma                            coagulation (APC).                           -  Normal duodenal bulb and second portion of the                            duodenum.                           - No specimens collected. Recommendation:           - Patient has a contact number available for                            emergencies. The signs and symptoms of potential                            delayed complications were discussed with the                            patient. Return to normal activities tomorrow.                            Written discharge instructions were provided to the                            patient.                           - Resume previous diet.                           - Continue present medications.                           - No aspirin, ibuprofen, naproxen, or other                            non-steroidal  anti-inflammatory drugs for at least                            2 weeks.                           - If no recurrent bleeding OK to resume Plavix and                            Coumadin in 7 days as clinically indicated however                            primary service should give serious consideration                            to discontiuing one of the two medications.                           - PPI daily for now. Procedure Code(s):        --- Professional ---  43255, Esophagogastroduodenoscopy, flexible,                            transoral; with control of bleeding, any method Diagnosis Code(s):        --- Professional ---                           G66.599, Angiodysplasia of stomach and duodenum                            with bleeding                           K92.1, Melena (includes Hematochezia) CPT copyright 2017 [employer]. All rights reserved. The codes documented in this report are preliminary and upon coder review may  be revised to meet current compliance requirements. Ladene Artist, MD 04/16/2018 10:35:12 AM This report has been signed electronically. Number of Addenda: 0

## 2018-04-16 NOTE — Progress Notes (Addendum)
PROGRESS NOTE    Bethany Park  OHK:[id number] DOB: Sep 13, 1927 DOA: 04/14/2018 PCP: Emeterio Reeve, DO  Brief Narrative: Pt seen, admitted earlier this am by Dr.Niu, this is an 84/F with Dementia, CAD/PCI stents on plavix, H/o CVA, AFib on Coumadin , admitted with melena, weakness and falls recently -Hb 4.5 on admission with INR of 2.2, CT abd with diverticulosis, Report of colonoscopy from 2014 with diverticula and polyps -Held Plavix and Coumadin, given FFP and getting PRBC  Assessment & Plan:    Upper GI bleed/melena -admitted with hemoglobin of 4.5, history of ongoing melena for weeks to months -Given 3 units of PRBC,  FFP and vitamin K -appreciated gastroenterology consult, underwent EGD today which noted gastric AVMs  Treated with APC -Continue PPI -Plavix and Coumadin on hold -per GI recommendations okay to resume Plavix and Coumadin in 7 days if no recurrent bleeding -Will transfuse another unit of PRBC today   CAD -obtained records from [facility] in [place] state -She had severe single-vessel coronary artery disease involving 2 high-grade lesions in mid LAD and severe stenosis in the ostium of first diagonal branch treated with PCI to mid LAD with 1 DES and PCI to bifurcation of mid LAD and diagonal with 2 DES stents, per Cath note 09/2017, recommended dual antiplatelet therapy for at least 6 months with aspirin 81 mg indefinitely thereafter -since she has completed 6 months of Plavix, will restart aspirin in 7 days if no active bleeding -Needs to establish with new cardiologist in [facility]  Suspected aortic stenosis -She has large ejection systolic murmur -no plan to further workup this up, in the current setting  Atrial Fibrillation:  -CHA2DS2-VASc Score is ,7  -Patient is on Coumadin at baseline, -Continue Coreg, Coumadin held in the setting of active upper GI bleed   Essential hypertension: -Coreg restarted, monitor  HLD (hyperlipidemia) -  Lipitor;  History of CVA (cerebrovascular accident): -Continue Lipitor -Holding Coumadin  Depression: -Continue Requip  Fall: CT of the head negative. -physical therapy tomorrow  Hyponatremia: Na 124 on admission -resolved with hydration  AoCKD-III: -likely hemodynamically mediated, resolved with volume expansion/blood transfusion  DVT ppx: SCD Code Status:DNR Family Communication:  no family at bedside Disposition Plan:  home in 48 hours of stable   Consultants:   GI  Procedures: Impression:               - Normal esophagus.                           - Small hiatal hernia.                           - Three bleeding angiodysplastic lesions in the                            gastric cardia. Treated with argon plasma                            coagulation (APC).                           - Normal duodenal bulb and second portion of the  duodenum.                           - No specimens collected. Recommendation:           - Patient has a contact number available for                            emergencies. The signs and symptoms of potential                            delayed complications were discussed with the                            patient. Return to normal activities tomorrow.                            Written discharge instructions were provided to the                            patient.                           - Resume previous diet.                           - Continue present medications.                           - No aspirin, ibuprofen, naproxen, or other                            non-steroidal anti-inflammatory drugs for at least                            2 weeks.                           - If no recurrent bleeding OK to resume Plavix and                            Coumadin in 7 days as clinically indicated however                            primary service should give serious consideration                             to discontiuing one of the two medications.                           - PPI daily for now.     Antimicrobials:    Subjective: -feels okay today, no chest pain or dyspnea, just back from endoscopy  Objective: Vitals:   04/16/18 1042 04/16/18 1059 04/16/18 1209 04/16/18 1239  BP: (!) 116/29 138/60 (!) 110/94 (!) 119/53  Pulse: 66 63 63 62  Resp: 15 15 18  (!) 21  Temp:  98.3 F (36.8 C)  98.4 F (36.9 C) 97.9 F (36.6 C)  TempSrc:  Oral Oral Oral  SpO2: 97% 95% 95% 95%  Weight:      Height:        Intake/Output Summary (Last 24 hours) at 04/16/2018 1450 Last data filed at 04/16/2018 1223 Gross per 24 hour  Intake 1791.25 ml  Output 1810 ml  Net -18.75 ml   Filed Weights   04/15/18 1603 04/15/18 1800  Weight: 67.1 kg (148 lb) 70.8 kg (156 lb 1.4 oz)    Examination:  General exam: Appears calm and comfortable, no distress Respiratory system: decreased breath sounds at both bases, rest clear Cardiovascular system: S1 & S2 heard, RRR. Loud systolic ejection murmur  Gastrointestinal system: Abdomen is nondistended, soft and nontender.Normal bowel sounds heard. Central nervous system: Alert and oriented x2. No focal neurological deficits. Extremities: Symmetric 5 x 5 power. Skin: No rashes, lesions or ulcers Psychiatry: Mood & affect appropriate.     Data Reviewed:   CBC: Recent Labs  Lab 04/14/18 2354 04/15/18 0504 04/15/18 2023 04/16/18 0412  WBC 7.4 6.9 5.0 6.1  NEUTROABS  --   --   --  4.7  HGB 4.5* 4.5* 6.1* 7.5*  HCT 14.0* 14.6* 18.7* 22.2*  MCV 102.2* 105.8* 91.2 89.5  PLT 172 170 123* 633   Basic Metabolic Panel: Recent Labs  Lab 04/14/18 2354 04/15/18 0504 04/16/18 0412  NA 124* 125* 135  K 4.1 4.2 4.1  CL 94* 98* 106  CO2 21* 21* 22  GLUCOSE 113* 114* 103*  BUN 57* 52* 40*  CREATININE 1.52* 1.36* 1.18*  CALCIUM 8.3* 8.0* 8.6*   GFR: Estimated Creatinine Clearance: 31.8 mL/min (A) (by C-G formula based on SCr of 1.18 mg/dL (H)). Liver  Function Tests: No results for input(s): AST, ALT, ALKPHOS, BILITOT, PROT, ALBUMIN in the last 168 hours. No results for input(s): LIPASE, AMYLASE in the last 168 hours. No results for input(s): AMMONIA in the last 168 hours. Coagulation Profile: Recent Labs  Lab 04/15/18 0504  INR 1.67   Cardiac Enzymes: Recent Labs  Lab 04/15/18 0504  TROPONINI <0.03   BNP (last 3 results) No results for input(s): PROBNP in the last 8760 hours. HbA1C: No results for input(s): HGBA1C in the last 72 hours. CBG: No results for input(s): GLUCAP in the last 168 hours. Lipid Profile: No results for input(s): CHOL, HDL, LDLCALC, TRIG, CHOLHDL, LDLDIRECT in the last 72 hours. Thyroid Function Tests: Recent Labs    04/15/18 0504  TSH 3.339   Anemia Panel: No results for input(s): VITAMINB12, FOLATE, FERRITIN, TIBC, IRON, RETICCTPCT in the last 72 hours. Urine analysis:    Component Value Date/Time   COLORURINE STRAW (A) 04/14/2018 2345   APPEARANCEUR CLEAR 04/14/2018 2345   LABSPEC 1.011 04/14/2018 2345   PHURINE 5.0 04/14/2018 2345   GLUCOSEU NEGATIVE 04/14/2018 2345   HGBUR SMALL (A) 04/14/2018 2345   BILIRUBINUR NEGATIVE 04/14/2018 2345   KETONESUR NEGATIVE 04/14/2018 2345   PROTEINUR NEGATIVE 04/14/2018 2345   NITRITE NEGATIVE 04/14/2018 2345   LEUKOCYTESUR MODERATE (A) 04/14/2018 2345   Sepsis Labs: @LABRCNTIP (procalcitonin:4,lacticidven:4)  ) Recent Results (from the past 240 hour(s))  Urine Culture     Status: Abnormal   Collection Time: 04/15/18  5:21 AM  Result Value Ref Range Status   Specimen Description URINE, RANDOM  Final   Special Requests   Final    NONE Performed at [facility], [place] [address], [place], [place] [address]    Culture MULTIPLE  SPECIES PRESENT, SUGGEST RECOLLECTION (A)  Final   Report Status 04/16/2018 FINAL  Final  Culture, blood (Routine X 2) w Reflex to ID Panel     Status: None (Preliminary result)   Collection Time: 04/15/18  5:58 PM    Result Value Ref Range Status   Specimen Description BLOOD RIGHT ANTECUBITAL  Final   Special Requests   Final    BOTTLES DRAWN AEROBIC AND ANAEROBIC Blood Culture results may not be optimal due to an inadequate volume of blood received in culture bottles   Culture   Final    NO GROWTH < 24 HOURS Performed at [facility], [place] [address], [place], [place] [address]    Report Status PENDING  Incomplete  Culture, blood (Routine X 2) w Reflex to ID Panel     Status: None (Preliminary result)   Collection Time: 04/15/18  5:58 PM  Result Value Ref Range Status   Specimen Description BLOOD LEFT HAND  Final   Special Requests   Final    BOTTLES DRAWN AEROBIC AND ANAEROBIC Blood Culture results may not be optimal due to an inadequate volume of blood received in culture bottles   Culture   Final    NO GROWTH < 24 HOURS Performed at [facility], [place] [address] [place], [place] [address]    Report Status PENDING  Incomplete  MRSA PCR Screening     Status: None   Collection Time: 04/15/18  6:20 PM  Result Value Ref Range Status   MRSA by PCR NEGATIVE NEGATIVE Final    Comment:        The GeneXpert MRSA Assay (FDA approved for NASAL specimens only), is one component of a comprehensive MRSA colonization surveillance program. It is not intended to diagnose MRSA infection nor to guide or monitor treatment for MRSA infections. Performed at [facility], [place] [address], [place], [place] [address]          Radiology Studies: Ct Abdomen Pelvis Wo Contrast  Result Date: 04/15/2018 CLINICAL DATA:  84 year old with acute abdominal pain. Lethargy. Stated history of blunt abdominal trauma. EXAM: CT ABDOMEN AND PELVIS WITHOUT CONTRAST TECHNIQUE: Multidetector CT imaging of the abdomen and pelvis was performed following the standard protocol without IV contrast. COMPARISON:  None. FINDINGS: Lower chest: Minimal subpleural reticulation in the right middle and right lower lobes.  No focal consolidation. No pleural fluid. There are coronary artery calcifications. Hepatobiliary: No discrete focal hepatic lesion. Postcholecystectomy with expected postcholecystectomy biliary prominence. Common bile duct measures 9 mm. Pancreas: Few parenchymal calcifications in the distal body and tail. No ductal dilatation or inflammation. Spleen: Normal in size without focal abnormality. Adrenals/Urinary Tract: No adrenal nodule. No hydronephrosis. Mild symmetric perinephric edema. No urolithiasis. Urinary bladder is physiologically distended without wall thickening. Stomach/Bowel: Sigmoid colonic diverticulosis without diverticulitis. Moderate stool burden in the colon. No bowel wall thickening, inflammatory change or obstruction. The appendix is not visualized. Stomach distended with ingested contents. Vascular/Lymphatic: Aortic atherosclerosis without aneurysm. No enlarged abdominal or pelvic lymph nodes. Reproductive: Post hysterectomy.  No adnexal mass. Other: Postsurgical change of the anterior abdominal wall. No free air, free fluid, or intra-abdominal fluid collection. Musculoskeletal: The bones are under mineralized. Degenerative change at the pubic symphysis. Multilevel degenerative change throughout the lumbar spine. IMPRESSION: 1. Mild symmetric bilateral perinephric edema, non-specific and may be chronic. Recommend correlation with urinalysis to exclude urinary tract infection. 2. Colonic diverticulosis without diverticulitis. 3.  Aortic Atherosclerosis (ICD10-I70.0). Electronically Signed  By: Jeb Levering M.D.   On: 04/15/2018 03:15   Dg Chest 2 View  Result Date: 04/15/2018 CLINICAL DATA:  Chest pain EXAM: CHEST - 2 VIEW COMPARISON:  None. FINDINGS: No focal opacity or pleural effusion. Mild cardiomegaly. Tortuous aorta with atherosclerosis. No pneumothorax. Right shoulder replacement. IMPRESSION: No active cardiopulmonary disease.  Mild cardiomegaly. Electronically Signed   By: Donavan Foil M.D.   On: 04/15/2018 02:46   Ct Head Wo Contrast  Result Date: 04/15/2018 CLINICAL DATA:  Hypotensive and lethargic EXAM: CT HEAD WITHOUT CONTRAST TECHNIQUE: Contiguous axial images were obtained from the base of the skull through the vertex without intravenous contrast. COMPARISON:  None. FINDINGS: Brain: No acute territorial infarction, hemorrhage or intracranial mass. Encephalomalacia in the left posterior parietal and occipital lobe superior atrophy. Mild small vessel ischemic changes of the white matter. Nonenlarged ventricles. Mild ex vacuo dilatation of the left posterior ventricle. Vascular: No hyperdense vessels.  Carotid vascular calcification. Skull: No fracture Sinuses/Orbits: Mucosal thickening in the ethmoid sinuses. Near complete opacification of right maxillary sinus. Probable old nasal bone fracture Other: None IMPRESSION: 1. No definite CT evidence for acute intracranial abnormality. 2. Atrophy and small vessel ischemic changes of the white matter. Encephalomalacia in the left posterior parietal and occipital lobes. Electronically Signed   By: Donavan Foil M.D.   On: 04/15/2018 03:10        Scheduled Meds: . atorvastatin  40 mg Oral Daily  . carvedilol  3.125 mg Oral BID WC  . feeding supplement  1 Container Oral TID BM  . furosemide  20 mg Intravenous Once  . gabapentin  600 mg Oral QHS  . mouth rinse  15 mL Mouth Rinse BID  . Melatonin  9 mg Sublingual QHS  . [START ON 04/18/2018] pantoprazole  40 mg Intravenous Q12H  . rOPINIRole  2 mg Oral BID   Continuous Infusions: . sodium chloride    . sodium chloride    . pantoprozole (PROTONIX) infusion 8 mg/hr (04/16/18 0405)     LOS: 1 day    Time spent: 39mn    PDomenic Polite MD [facility] Page via [email or web address], password [email or web address] After 7PM please contact night-coverage  04/16/2018, 2:50 PM

## 2018-04-16 NOTE — Anesthesia Preprocedure Evaluation (Signed)
Anesthesia Evaluation  Patient identified by MRN, date of birth, ID band Patient awake    Reviewed: Allergy & Precautions, NPO status , Patient's Chart, lab work & pertinent test results  Airway Mallampati: II  TM Distance: >3 FB Neck ROM: Full    Dental no notable dental hx. (+) Upper Dentures   Pulmonary sleep apnea , COPD,    Pulmonary exam normal breath sounds clear to auscultation       Cardiovascular hypertension, + CAD, + Past MI and + Cardiac Stents  negative cardio ROS Normal cardiovascular exam+ dysrhythmias Atrial Fibrillation + Valvular Problems/Murmurs AS  Rhythm:Regular Rate:Normal     Neuro/Psych Dementia CVA negative neurological ROS     GI/Hepatic negative GI ROS, Neg liver ROS,   Endo/Other  negative endocrine ROS  Renal/GU negative Renal ROS  negative genitourinary   Musculoskeletal negative musculoskeletal ROS (+)   Abdominal   Peds negative pediatric ROS (+)  Hematology negative hematology ROS (+)   Anesthesia Other Findings   Reproductive/Obstetrics negative OB ROS                             Anesthesia Physical Anesthesia Plan  ASA: III  Anesthesia Plan: MAC   Post-op Pain Management:    Induction: Intravenous  PONV Risk Score and Plan: 3 and Treatment may vary due to age or medical condition  Airway Management Planned: Nasal Cannula  Additional Equipment:   Intra-op Plan:   Post-operative Plan:   Informed Consent: I have reviewed the patients History and Physical, chart, labs and discussed the procedure including the risks, benefits and alternatives for the proposed anesthesia with the patient or authorized representative who has indicated his/her understanding and acceptance.   Dental advisory given  Plan Discussed with: CRNA  Anesthesia Plan Comments:         Anesthesia Quick Evaluation

## 2018-04-16 NOTE — Transfer of Care (Signed)
Immediate Anesthesia Transfer of Care Note  Patient: Bethany Park  Procedure(s) Performed: ESOPHAGOGASTRODUODENOSCOPY (EGD) (N/A ) HOT HEMOSTASIS (ARGON PLASMA COAGULATION/BICAP) (N/A )  Patient Location: Endoscopy Unit  Anesthesia Type:MAC  Level of Consciousness: awake, alert  and oriented  Airway & Oxygen Therapy: Patient Spontanous Breathing and Patient connected to nasal cannula oxygen  Post-op Assessment: Report given to RN, Post -op Vital signs reviewed and stable and Patient moving all extremities  Post vital signs: Reviewed and stable  Last Vitals:  Vitals Value Taken Time  BP    Temp    Pulse 66 04/16/2018 10:35 AM  Resp 20 04/16/2018 10:35 AM  SpO2 99 % 04/16/2018 10:35 AM  Vitals shown include unvalidated device data.  Last Pain:  Vitals:   04/16/18 0917  TempSrc: Oral  PainSc: 0-No pain         Complications: No apparent anesthesia complications

## 2018-04-16 NOTE — Progress Notes (Signed)
Reviewing chart and saw a low HGB of 6.1, never received a call from the lab but notified the physician on cal for a repeat lab for possibility of future transfusion. Will continue to monitor.

## 2018-04-16 NOTE — Interval H&P Note (Signed)
History and Physical Interval Note:  04/16/2018 9:49 AM  Bethany Park  has presented today for surgery, with the diagnosis of Melena and severe anemia  The various methods of treatment have been discussed with the patient and family. After consideration of risks, benefits and other options for treatment, the patient has consented to  Procedure(s): ESOPHAGOGASTRODUODENOSCOPY (EGD) (N/A) as a surgical intervention .  The patient's history has been reviewed, patient examined, no change in status, stable for surgery.  I have reviewed the patient's chart and labs.  Questions were answered to the patient's satisfaction.     Pricilla Riffle. Fuller Plan

## 2018-04-16 NOTE — Progress Notes (Addendum)
Initial Nutrition Assessment  DOCUMENTATION CODES:   Not applicable  INTERVENTION:   Boost Breeze po TID, each supplement provides 250 kcal and 9 grams of protein  NUTRITION DIAGNOSIS:   Increased nutrient needs related to acute illness as evidenced by estimated needs.  GOAL:   Patient will meet greater than or equal to 90% of their needs  MONITOR:   PO intake, Labs, Weight trends, Supplement acceptance, Diet advancement  REASON FOR ASSESSMENT:   Malnutrition Screening Tool    ASSESSMENT:   Patient with PMH significant for CVA, dementia, CVA, HTN, and HTN. Presents this admission with severe anemia and melena.    6/17- EDG, clear liquid diet  Pt denies having any loss in appetite PTA. States she lives with her daughter who cooks three meals for her daily. Meals consist of eggs, toast, meats, vegetables, and grains. She recent switched to a soft food diet after seeing her bloody stools. She had an EGD this afternoon and was placed on a clear liquid diet. RD to provide clear supplementation to maximize calories and protein while admitted.   Pt endorses a UBW of 150 lb and denies any weight loss. Records indicate pt weighed 145 lb 03/27/18 and 156 this admission. Nutrition-Focused physical exam completed.   Medications reviewed.  Labs reviewed.   NUTRITION - FOCUSED PHYSICAL EXAM:    Most Recent Value  Orbital Region  No depletion  Upper Arm Region  No depletion  Thoracic and Lumbar Region  Unable to assess  Buccal Region  No depletion  Temple Region  No depletion  Clavicle Bone Region  No depletion  Clavicle and Acromion Bone Region  No depletion  Scapular Bone Region  Unable to assess  Dorsal Hand  No depletion  Patellar Region  Mild depletion  Anterior Thigh Region  Mild depletion  Posterior Calf Region  Mild depletion  Edema (RD Assessment)  None  Hair  Reviewed  Eyes  Reviewed  Mouth  Reviewed  Skin  Reviewed  Nails  Reviewed     Diet Order:   Diet  Order           Diet clear liquid Room service appropriate? Yes; Fluid consistency: Thin  Diet effective now        Diet NPO time specified  Diet effective midnight          EDUCATION NEEDS:   Education needs have been addressed  Skin:  Skin Assessment: Reviewed RN Assessment  Last BM:  04/15/18  Height:   Ht Readings from Last 1 Encounters:  04/15/18 5' 4"  (1.626 m)    Weight:   Wt Readings from Last 1 Encounters:  04/15/18 156 lb 1.4 oz (70.8 kg)    Ideal Body Weight:  54.5 kg  BMI:  Body mass index is 26.79 kg/m.  Estimated Nutritional Needs:   Kcal:  1400-1600 kcal  Protein:  65-75 g  Fluid:  >1.4 L/day    Mariana Single RD, LDN Clinical Nutrition Pager # [phone]

## 2018-04-16 NOTE — Anesthesia Procedure Notes (Signed)
Procedure Name: MAC Date/Time: 04/16/2018 9:51 AM Performed by: Leonor Liv, CRNA Pre-anesthesia Checklist: Patient identified, Emergency Drugs available, Suction available, Patient being monitored and Timeout performed Patient Re-evaluated:Patient Re-evaluated prior to induction Oxygen Delivery Method: Nasal cannula Placement Confirmation: positive ETCO2

## 2018-04-16 NOTE — Telephone Encounter (Signed)
Called daughter to regarding provider's note for pt to continue taking current dose of coumadin. As per daughter, pt currently in the hospital due to extreme low blood pressure & she had to receive blood transfusion for other reasons. Daughter stated pt is currently located in [facility].

## 2018-04-17 ENCOUNTER — Encounter (HOSPITAL_COMMUNITY): Payer: Self-pay | Admitting: Physician Assistant

## 2018-04-17 DIAGNOSIS — D62 Acute posthemorrhagic anemia: Secondary | ICD-10-CM

## 2018-04-17 DIAGNOSIS — D500 Iron deficiency anemia secondary to blood loss (chronic): Secondary | ICD-10-CM

## 2018-04-17 DIAGNOSIS — K921 Melena: Secondary | ICD-10-CM

## 2018-04-17 DIAGNOSIS — K922 Gastrointestinal hemorrhage, unspecified: Secondary | ICD-10-CM

## 2018-04-17 LAB — BASIC METABOLIC PANEL
ANION GAP: 9 (ref 5–15)
BUN: 27 mg/dL — ABNORMAL HIGH (ref 6–20)
CHLORIDE: 101 mmol/L (ref 101–111)
CO2: 23 mmol/L (ref 22–32)
Calcium: 8.5 mg/dL — ABNORMAL LOW (ref 8.9–10.3)
Creatinine, Ser: 1.12 mg/dL — ABNORMAL HIGH (ref 0.44–1.00)
GFR calc Af Amer: 49 mL/min — ABNORMAL LOW (ref >60)
GFR calc non Af Amer: 43 mL/min — ABNORMAL LOW (ref >60)
Glucose, Bld: 96 mg/dL (ref 65–99)
POTASSIUM: 4 mmol/L (ref 3.5–5.1)
Sodium: 133 mmol/L — ABNORMAL LOW (ref 135–145)

## 2018-04-17 LAB — CBC
HEMATOCRIT: 26.2 % — AB (ref 36.0–46.0)
HEMOGLOBIN: 8.7 g/dL — AB (ref 12.0–15.0)
MCH: 30.7 pg (ref 26.0–34.0)
MCHC: 33.2 g/dL (ref 30.0–36.0)
MCV: 92.6 fL (ref 78.0–100.0)
Platelets: 164 10*3/uL (ref 150–400)
RBC: 2.83 MIL/uL — AB (ref 3.87–5.11)
RDW: 18.8 % — ABNORMAL HIGH (ref 11.5–15.5)
WBC: 8.9 10*3/uL (ref 4.0–10.5)

## 2018-04-17 LAB — BPAM RBC
BLOOD PRODUCT EXPIRATION DATE: 201906192359
BLOOD PRODUCT EXPIRATION DATE: 201906302359
BLOOD PRODUCT EXPIRATION DATE: 201906302359
Blood Product Expiration Date: 201906302359
ISSUE DATE / TIME: 201906171215
ISSUE DATE / TIME: 201906160524
ISSUE DATE / TIME: 201906160839
ISSUE DATE / TIME: 201906161118
UNIT TYPE AND RH: 600
UNIT TYPE AND RH: 600
Unit Type and Rh: 600
Unit Type and Rh: 600

## 2018-04-17 LAB — TYPE AND SCREEN
ABO/RH(D): A NEG
ANTIBODY SCREEN: NEGATIVE
UNIT DIVISION: 0
UNIT DIVISION: 0
UNIT DIVISION: 0
Unit division: 0

## 2018-04-17 MED ORDER — FAMOTIDINE 20 MG PO TABS
40.0000 mg | ORAL_TABLET | Freq: Every day | ORAL | Status: DC
  Administered 2018-04-17 – 2018-04-18 (×2): 40 mg via ORAL
  Filled 2018-04-17 (×2): qty 2

## 2018-04-17 NOTE — Progress Notes (Signed)
Occupational Therapy Evaluation Patient Details Name: [place] Park MRN: [id number] DOB: 04/28/1927 Today's Date: 04/17/2018    History of Present Illness 84 year old female with a PMH of CVA, HTN, afib on coumadin, and hyperlipidemia admitted for a severe anemia.  The patient reports feeling weak for the past several days, but over the last month she reports black stools.  She states that she told her daughter who attributed the discoloration to something that she was eating.  Two days prior to admission she had a fall without any injury.  She denies any issues with chest pain, SOB, nausea, or vomiting. She did does report a mild RLQ pain as well as GERD.  She takes GERD medications inconsistently and there is no recent use of NSAIDs.  Her last HGB on 07/21/2016 was at 9.9 g/dL.  And her admission HGB was 4.5 g/dL.  She underwent ESOPHAGOGASTRODUODENOSCOPY 04/16/18    Clinical Impression   PTA, pt lived at home with her daughter, who supervised with ADL and mobility for ADL. @ rollator level. Pt states she recently moved from [place] state where she lived independently. During session, pt complaining of "flashes of light" - nsg notified and stated she would notify MD. SpO2 remained above 90 on RA throughout session; dyspnea 2/4. Given deficits listed below, recommend follow up with [facility]. Will follow acutely to facilitate safe DC home and maximize functional level of independence.     Follow Up Recommendations  Home health OT;Supervision/Assistance - 24 hour  Follow up with her eye doctor   Equipment Recommendations  None recommended by OT    Recommendations for Other Services       Precautions / Restrictions Precautions Precautions: Fall Restrictions Weight Bearing Restrictions: No      Mobility Bed Mobility Overal bed mobility: Modified Independent             General bed mobility comments: pt in recliner on PT arrival  Transfers Overall transfer level: Needs  assistance Equipment used: Rolling walker (2 wheeled) Transfers: Sit to/from [facility] Sit to Stand: Min assist Stand pivot transfers: Min assist       General transfer comment:  no assist to power up    Balance Overall balance assessment: Needs assistance   Sitting balance-Leahy Scale: Good       Standing balance-Leahy Scale: Fair Standing balance comment: reliant on external support                           ADL either performed or assessed with clinical judgement   ADL Overall ADL's : Needs assistance/impaired     Grooming: Set up;Sitting   Upper Body Bathing: Set up;Sitting   Lower Body Bathing: Minimal assistance;Sit to/from stand   Upper Body Dressing : Set up;Sitting   Lower Body Dressing: Minimal assistance;Sit to/from stand   Toilet Transfer: Minimal assistance;RW;BSC   Toileting- Clothing Manipulation and Hygiene: Minimal assistance;Sit to/from stand Toileting - Clothing Manipulation Details (indicate cue type and reason): steady A when standing     Functional mobility during ADLs: Minimal assistance;Rolling walker;Cueing for safety General ADL Comments: " I always make sure something is behind me so I don't fall"; O2 sats remained in 90s throughout activity     Vision Baseline Vision/History: Wears glasses;Cataracts;Macular Degeneration;Legally blind Wears Glasses: At all times Patient Visual Report: Other (comment)(seeing flashes of light) Vision Assessment?: Vision impaired- to be further tested in functional context Additional Comments: getting shots for MD; partially blind  L eye     Perception     Praxis      Pertinent Vitals/Pain Pain Assessment: Faces Faces Pain Scale: Hurts even more Pain Location: back;legs Pain Descriptors / Indicators: Aching;Discomfort;Grimacing Pain Intervention(s): Premedicated before session;Limited activity within patient's tolerance     Hand Dominance Right   Extremity/Trunk  Assessment Upper Extremity Assessment Upper Extremity Assessment: Generalized weakness   Lower Extremity Assessment Lower Extremity Assessment: Defer to PT evaluation   Cervical / Trunk Assessment Cervical / Trunk Assessment: Kyphotic   Communication Communication Communication: No difficulties   Cognition Arousal/Alertness: Awake/alert Behavior During Therapy: WFL for tasks assessed/performed Overall Cognitive Status: No family/caregiver present to determine baseline cognitive functioning                                 General Comments: most likely baeline   General Comments       Exercises     Shoulder Instructions      Home Living Family/patient expects to be discharged to:: Private residence Living Arrangements: Children Available Help at Discharge: Family;Available 24 hours/day Type of Home: House Home Access: Stairs to enter     Home Layout: One level     Bathroom Shower/Tub: Corporate investment banker: Standard Bathroom Accessibility: Yes How Accessible: Accessible via walker Home Equipment: [place] - 4 wheels;Shower seat;Bedside commode;Grab bars - tub/shower          Prior Functioning/Environment Level of Independence: Needs assistance  Gait / Transfers Assistance Needed: needs assistance for stairs, able to walk with RW without assist, history of falls ADL's / Homemaking Assistance Needed: daughter S with bathing and assists for tub transfers   Comments: recently moved here form [place] and lived alone        OT Problem List: Decreased strength;Decreased activity tolerance;Impaired balance (sitting and/or standing);Decreased safety awareness;Decreased knowledge of use of DME or AE;Cardiopulmonary status limiting activity;Pain      OT Treatment/Interventions: Self-care/ADL training;Therapeutic exercise;Neuromuscular education;Energy conservation;DME and/or AE instruction;Therapeutic activities;Cognitive  remediation/compensation;Visual/perceptual remediation/compensation;Patient/family education;Balance training    OT Goals(Current goals can be found in the care plan section) Acute Rehab OT Goals Patient Stated Goal: go home OT Goal Formulation: With patient Time For Goal Achievement: 05/01/18 Potential to Achieve Goals: Good  OT Frequency: Min 2X/week   Barriers to D/C:            Co-evaluation              AM-PAC PT "6 Clicks" Daily Activity     Outcome Measure Help from another person eating meals?: None Help from another person taking care of personal grooming?: A Little Help from another person toileting, which includes using toliet, bedpan, or urinal?: A Little Help from another person bathing (including washing, rinsing, drying)?: A Little Help from another person to put on and taking off regular upper body clothing?: A Little Help from another person to put on and taking off regular lower body clothing?: A Little 6 Click Score: 19   End of Session Equipment Utilized During Treatment: Gait belt;Rolling walker Nurse Communication: Mobility status;Other (comment)(seeing "flashes of light")  Activity Tolerance: Patient tolerated treatment well Patient left: in chair;with call bell/phone within reach;with chair alarm set  OT Visit Diagnosis: Unsteadiness on feet (R26.81);Repeated falls (R29.6);Pain;Muscle weakness (generalized) (M62.81) Pain - part of body: Leg(back)                Time: 0175-1025 OT Time Calculation (  min): 20 min Charges:  OT General Charges $OT Visit: 1 Visit OT Evaluation $OT Eval Low Complexity: 1 Low G-Codes:     Maurie Boettcher, OT/L  OT Clinical Specialist [phone]   [facility] 04/17/2018, 11:37 AM

## 2018-04-17 NOTE — Progress Notes (Signed)
Referring-Natalie Alexander, DO Reason for referral-coronary artery disease, aortic stenosis and atrial fibrillation.  HPI: 84 year old female for evaluation of coronary artery disease, aortic stenosis and atrial fibrillation at request of Emeterio Reeve, DO.  Previously resided in [place].  Patient had cardiac catheterization December 2018.  She had mid LAD disease and ostial first diagonal disease.  She had PCI to mid LAD and PCI to the bifurcation of mid LAD and diagonal with 2 drug-eluting stents.  Patient was placed on dual antiplatelet therapy.  She was also on Coumadin for history of atrial fibrillation.  She was just discharged today following admission for upper GI bleed.  Her initial hemoglobin was 4.5 and she required 4 units of packed red blood cells.  Coumadin and Plavix were discontinued.  Per GI recommendations they should be held for 1 week and then one could be resumed.  Hemoglobin improved to 8.7 at discharge.  Also noted to have aortic stenosis murmur.  Patient now states she has occasional chest pain but improved since transfusion.  She has mild dyspnea also improved with transfusion.  She denies syncope.  Note she has significant dementia and falls frequently.  Also note patient had EGD showed 3 angiodysplastic lesions in the cardia of the stomach treated are argon plasma coagulation.  Current Outpatient Medications  Medication Sig Dispense Refill  . atorvastatin (LIPITOR) 40 MG tablet Take 1 tablet (40 mg total) by mouth daily. 90 tablet 3  . carvedilol (COREG) 3.125 MG tablet Take 1 tablet (3.125 mg total) by mouth 2 (two) times daily with a meal. 180 tablet 3  . [START ON 04/19/2018] famotidine (PEPCID) 40 MG tablet Take 1 tablet (40 mg total) by mouth daily. 30 tablet 0  . gabapentin (NEURONTIN) 300 MG capsule Take 1 capsule (300 mg total) by mouth at bedtime for 3 days, THEN 2 capsules (600 mg total) at bedtime. (Patient taking differently: Take 2 capsules (600 mg  total) at bedtime.) 63 capsule 0  . HYDROcodone-acetaminophen (NORCO) 5-325 MG tablet Take 1 tablet by mouth every 6 (six) hours as needed for severe pain. 15 tablet 0  . lisinopril (PRINIVIL,ZESTRIL) 2.5 MG tablet Take 1 tablet (2.5 mg total) by mouth daily. 90 tablet 3  . LORazepam (ATIVAN) 0.5 MG tablet Take 1 tablet (0.5 mg total) by mouth 2 (two) times daily as needed for anxiety. DO NOT TAKE in evenings w/ Temapzepam 30 tablet 2  . Melatonin 10 MG SUBL Place 10 mg under the tongue at bedtime. 90 tablet 3  . nitroGLYCERIN (NITROSTAT) 0.4 MG SL tablet Place 1 tablet (0.4 mg total) under the tongue every 5 (five) minutes as needed for chest pain. If no better 10-15 mins, go to hospital 20 tablet 1  . rOPINIRole (REQUIP) 2 MG tablet Take 1 tablet (2 mg total) by mouth 2 (two) times daily. 180 tablet 3  . warfarin (COUMADIN) 5 MG tablet Except 7.5 mg Weds, Saturday Please take only after a week of discharge date after talking to your primary care physician 90 tablet 3   No current facility-administered medications for this visit.     Allergies  Allergen Reactions  . Alendronate Sodium   . Benztropine Mesylate [Benztropine]   . Citalopram   . Cymbalta [Duloxetine Hcl]   . Erythromycin   . Fluoxetine Hcl   . Glimepiride   . Nortriptyline Hcl   . Penicillins   . Sulfa Antibiotics   . Seroquel [Quetiapine Fumarate] Itching and Other (See Comments)  As per daughter, hallucinations.     Past Medical History:  Diagnosis Date  . Anemia   . Aortic stenosis   . Atrial fibrillation ([place])   . B12 deficiency 03/13/2018  . Colon polyp 09/2013   in [place].  for personal hx polyps and fm hx colon ca.  found small transverse polyp, diverticulosis, int hemorrhoids.    . Dementia   . Depression with anxiety 03/13/2018  . Essential hypertension 03/13/2018  . Glucose intolerance 03/13/2018  . Heart attack ([place])   . Heart failure ([place])   . History of COPD   . History of coronary  artery stent placement 09/2017  . Moderate aortic stenosis 03/13/2018  . Osteoporosis 03/13/2018  . Ovarian cancer ([place])   . Restless leg syndrome 03/13/2018  . Stroke [facility]     Past Surgical History:  Procedure Laterality Date  . ABDOMINAL HYSTERECTOMY    . BACK SURGERY    . BREAST SURGERY    . CHOLECYSTECTOMY    . CORONARY STENT PLACEMENT    . EYE SURGERY    . HEMORROIDECTOMY    . JOINT REPLACEMENT      Social History   Socioeconomic History  . Marital status: Widowed    Spouse name: Not on file  . Number of children: 8  . Years of education: Not on file  . Highest education level: Not on file  Occupational History  . Not on file  Social Needs  . Financial resource strain: Not on file  . Food insecurity:    Worry: Not on file    Inability: Not on file  . Transportation needs:    Medical: Not on file    Non-medical: Not on file  Tobacco Use  . Smoking status: Never Smoker  . Smokeless tobacco: Never Used  Substance and Sexual Activity  . Alcohol use: Not Currently  . Drug use: Not Currently  . Sexual activity: Not Currently  Lifestyle  . Physical activity:    Days per week: Not on file    Minutes per session: Not on file  . Stress: Not on file  Relationships  . Social connections:    Talks on phone: Not on file    Gets together: Not on file    Attends religious service: Not on file    Active member of club or organization: Not on file    Attends meetings of clubs or organizations: Not on file    Relationship status: Not on file  . Intimate partner violence:    Fear of current or ex partner: Not on file    Emotionally abused: Not on file    Physically abused: Not on file    Forced sexual activity: Not on file  Other Topics Concern  . Not on file  Social History Narrative  . Not on file    Family History  Problem Relation Age of Onset  . Heart disease Mother   . Diabetes Mellitus II Mother   . Stroke Mother   . Colon cancer Father     ROS: no  fevers or chills, productive cough, hemoptysis, dysphasia, odynophagia, melena, hematochezia, dysuria, hematuria, rash, seizure activity, orthopnea, PND, pedal edema, claudication. Remaining systems are negative.  Physical Exam:   Blood pressure (!) 150/77, pulse 66, height 5' 4"  (1.626 m), weight 149 lb 1.9 oz (67.6 kg), SpO2 97 %.  General:  Well developed/frail in NAD Skin warm/dry Patient not depressed No peripheral clubbing Back-normal HEENT-normal/normal eyelids Neck supple/normal carotid upstroke  bilaterally; no bruits; no JVD; no thyromegaly chest - CTA/ normal expansion CV - RRR/normal S1 and S2; no rubs or gallops;  PMI nondisplaced, 3/6 systolic murmur left sternal border. Abdomen -NT/ND, no HSM, no mass, + bowel sounds, no bruit 2+ femoral pulses, no bruits Ext-no edema, chords; ecchymosis noted. Neuro-moves all extremities.  No focal findings though patient does have dementia.  A/P  1 coronary artery disease with previous PCI-patient was recently admitted with GI bleed as outlined.  We will continue statin.  We will follow gastroenterology's recommendations and continue to hold Plavix for 1 week and then we will resume at 75 mg daily.  2 history of paroxysmal atrial fibrillation-patient was in sinus during recent admission and on examination today.  Continue low-dose beta-blocker.  I do not think we can continue Coumadin.  She falls frequently and has dementia.  She also needs to continue Plavix for recent stents.  I do not think therapy with both Plavix and Coumadin will be appropriate in light of recent GI bleed.  Patient's daughter was given this information and is in agreement.  She understands the higher risk of CVA.  3 aortic stenosis-significant aortic stenosis murmur on examination.  However patient is a no CODE BLUE and has dementia.  Daughter states that she would not want any aggressive measures such as consideration of TAVR nor do I think she would be a good  candidate.  We therefore have elected not to pursue follow-up echocardiograms.  4 recent GI bleed-angiodysplastic lesions noted in stomach that were treated.  She will need follow-up with gastroenterology and primary care.  5 hypertension-blood pressure is mildly elevated today.  We will follow and advance medications as needed.  6 dementia  7 no CODE BLUE status-patient's daughter wants only conservative measures.  Kirk Ruths, MD

## 2018-04-17 NOTE — Progress Notes (Signed)
PROGRESS NOTE    Bethany Park  HUD:[id number] DOB: October 31, 1927 DOA: 04/14/2018 PCP: Bethany Reeve, DO  Brief Narrative: Pt seen, admitted earlier this am by Dr.Niu, this is an 84/F with Dementia, CAD/PCI stents on plavix, H/o CVA, AFib on Coumadin , admitted with melena, weakness and falls recently -Hb 4.5 on admission with INR of 2.2, CT abd with diverticulosis, Report of colonoscopy from 2014 with diverticula and polyps -Held Plavix and Coumadin, given FFP and 4 units of PRBC -EGD with 3 gastric AVMs ablated with APC laser  Assessment & Plan:    Upper GI bleed/melena -admitted with hemoglobin of 4.5, history of ongoing melena for weeks to months -Given 4 units of PRBC,  FFP and vitamin K -appreciated gastroenterology consult, underwent EGD which noted 3 gastric AVMs  Treated with APC -Continue PPI -Plavix and Coumadin on hold -per GI recommendations okay to resume Plavix/ Coumadin in 7 days if no recurrent bleeding, preferably to stop one of these meds,  - Called and d/w Cardiology /DOD on Call Dr.Croitoru, under current circumstances and since completed 28month of plavix will stop Plavix at discharge and resume coumadin in 1 week   [facility] -Per Cath 09/2017, she had severe single-vessel coronary artery disease involving 2 high-grade lesions in mid LAD and severe stenosis in the ostium of first diagonal branch treated with PCI to mid LAD with 1 DES and PCI to bifurcation of mid LAD and diagonal with 2 DES stents, recommended dual antiplatelet therapy for at least 6 months with aspirin 81 mg indefinitely thereafter -she has completed 6 months of Plavix, see above d/w Cardiology on call, will stop Plavix and resume coumadin only for Afib -[facility] Suspected aortic stenosis -She has loud ejection systolic murmur -no plan to further workup this up, in the current  setting -Outpatient Cards FU needed  Atrial Fibrillation:  -CHA2DS2-VASc Score is ,7  -Patient is on Coumadin at baseline, -Continue Coreg, Coumadin held in the setting of active upper GI bleed, restart 6/24 if no active bleeding   Essential hypertension: -Coreg restarted, monitor  HLD (hyperlipidemia) - Lipitor;  History of CVA (cerebrovascular accident): -Continue Lipitor -Holding Coumadin, see above  Depression: -Continue Requip  Fall: CT of the head negative. -physical therapy today  Hyponatremia: Na 124 on admission -resolved with hydration  AoCKD-III: -likely hemodynamically mediated, resolved with volume expansion/blood transfusion  DVT ppx: SCD Code Status:DNR Family Communication:  no family at bedside, left msg for daughter NHorton Finerdidn't answer the call Disposition Plan:  home in 24-48 hours if stable   Consultants:   GI  D/w Cardiology Dr.Croitoru  Procedures: Impression:               - Normal esophagus.                           - Small hiatal hernia.                           - Three bleeding angiodysplastic lesions in the                            gastric cardia. Treated with argon plasma  coagulation (APC).                           - Normal duodenal bulb and second portion of the                            duodenum.                           - No specimens collected. Recommendation:           - Patient has a contact number available for                            emergencies. The signs and symptoms of potential                            delayed complications were discussed with the                            patient. Return to normal activities tomorrow.                            Written discharge instructions were provided to the                            patient.                           - Resume previous diet.                           - Continue present medications.                           -  No aspirin, ibuprofen, naproxen, or other                            non-steroidal anti-inflammatory drugs for at least                            2 weeks.                           - If no recurrent bleeding OK to resume Plavix and                            Coumadin in 7 days as clinically indicated however                            primary service should give serious consideration                            to discontiuing one of the two medications.                           - PPI daily for  now.     Antimicrobials:  The eye while the brain about 00 patient that I'm following  Subjective: -feels weak overall, but improving, no chest pain or dyspnea  Objective: Vitals:   04/17/18 0753 04/17/18 1026 04/17/18 1240 04/17/18 1245  BP: (!) 126/57  (!) 90/50 (!) 106/54  Pulse: 64 87 (!) 55   Resp: 19  (!) 23   Temp: 98.7 F (37.1 C)     TempSrc: Oral     SpO2: 97%  92%   Weight:      Height:        Intake/Output Summary (Last 24 hours) at 04/17/2018 1318 Last data filed at 04/17/2018 0611 Gross per 24 hour  Intake 441.25 ml  Output 1900 ml  Net -1458.75 ml   Filed Weights   04/15/18 1603 04/15/18 1800  Weight: 67.1 kg (148 lb) 70.8 kg (156 lb 1.4 oz)    Examination:  Gen: Awake, Alert, Oriented X 3,  HEENT: PERRLA, no JVD Lungs: trace crackles KZS:WFUX 2/regular rate and rhythm, loud systolic ejection murmur Abd: soft, Non tender, non distended, BS present Extremities: No Cyanosis, Clubbing or edema Skin: no new rashes Psychiatry: Mood & affect appropriate.     Data Reviewed:   CBC: Recent Labs  Lab 04/14/18 2354 04/15/18 0504 04/15/18 2023 04/16/18 0412 04/17/18 0451  WBC 7.4 6.9 5.0 6.1 8.9  NEUTROABS  --   --   --  4.7  --   HGB 4.5* 4.5* 6.1* 7.5* 8.7*  HCT 14.0* 14.6* 18.7* 22.2* 26.2*  MCV 102.2* 105.8* 91.2 89.5 92.6  PLT 172 170 123* 176 323   Basic Metabolic Panel: Recent Labs  Lab 04/14/18 2354 04/15/18 0504 04/16/18 0412  04/17/18 0451  NA 124* 125* 135 133*  K 4.1 4.2 4.1 4.0  CL 94* 98* 106 101  CO2 21* 21* 22 23  GLUCOSE 113* 114* 103* 96  BUN 57* 52* 40* 27*  CREATININE 1.52* 1.36* 1.18* 1.12*  CALCIUM 8.3* 8.0* 8.6* 8.5*   GFR: Estimated Creatinine Clearance: 33.5 mL/min (A) (by C-G formula based on SCr of 1.12 mg/dL (H)). Liver Function Tests: No results for input(s): AST, ALT, ALKPHOS, BILITOT, PROT, ALBUMIN in the last 168 hours. No results for input(s): LIPASE, AMYLASE in the last 168 hours. No results for input(s): AMMONIA in the last 168 hours. Coagulation Profile: Recent Labs  Lab 04/15/18 0504  INR 1.67   Cardiac Enzymes: Recent Labs  Lab 04/15/18 0504  TROPONINI <0.03   BNP (last 3 results) No results for input(s): PROBNP in the last 8760 hours. HbA1C: No results for input(s): HGBA1C in the last 72 hours. CBG: No results for input(s): GLUCAP in the last 168 hours. Lipid Profile: No results for input(s): CHOL, HDL, LDLCALC, TRIG, CHOLHDL, LDLDIRECT in the last 72 hours. Thyroid Function Tests: Recent Labs    04/15/18 0504  TSH 3.339   Anemia Panel: No results for input(s): VITAMINB12, FOLATE, FERRITIN, TIBC, IRON, RETICCTPCT in the last 72 hours. Urine analysis:    Component Value Date/Time   COLORURINE STRAW (A) 04/14/2018 2345   APPEARANCEUR CLEAR 04/14/2018 2345   LABSPEC 1.011 04/14/2018 2345   PHURINE 5.0 04/14/2018 2345   GLUCOSEU NEGATIVE 04/14/2018 2345   HGBUR SMALL (A) 04/14/2018 2345   BILIRUBINUR NEGATIVE 04/14/2018 2345   KETONESUR NEGATIVE 04/14/2018 2345   PROTEINUR NEGATIVE 04/14/2018 2345   NITRITE NEGATIVE 04/14/2018 2345   LEUKOCYTESUR MODERATE (A) 04/14/2018 2345   Sepsis Labs: @LABRCNTIP (procalcitonin:4,lacticidven:4)  ) Recent Results (from  the past 240 hour(s))  Urine Culture     Status: Abnormal   Collection Time: 04/15/18  5:21 AM  Result Value Ref Range Status   Specimen Description URINE, RANDOM  Final   Special Requests    Final    NONE Performed at [facility], [address] [place], [place] [address]    Culture MULTIPLE SPECIES PRESENT, SUGGEST RECOLLECTION (A)  Final   Report Status 04/16/2018 FINAL  Final  Culture, blood (Routine X 2) w Reflex to ID Panel     Status: None (Preliminary result)   Collection Time: 04/15/18  5:58 PM  Result Value Ref Range Status   Specimen Description BLOOD RIGHT ANTECUBITAL  Final   Special Requests   Final    BOTTLES DRAWN AEROBIC AND ANAEROBIC Blood Culture results may not be optimal due to an inadequate volume of blood received in culture bottles   Culture   Final    NO GROWTH < 24 HOURS Performed at [facility], [place] [address] [place], [place] [address]    Report Status PENDING  Incomplete  Culture, blood (Routine X 2) w Reflex to ID Panel     Status: None (Preliminary result)   Collection Time: 04/15/18  5:58 PM  Result Value Ref Range Status   Specimen Description BLOOD LEFT HAND  Final   Special Requests   Final    BOTTLES DRAWN AEROBIC AND ANAEROBIC Blood Culture results may not be optimal due to an inadequate volume of blood received in culture bottles   Culture   Final    NO GROWTH < 24 HOURS Performed at [facility], [place] [address], [place], [place] [address]    Report Status PENDING  Incomplete  MRSA PCR Screening     Status: None   Collection Time: 04/15/18  6:20 PM  Result Value Ref Range Status   MRSA by PCR NEGATIVE NEGATIVE Final    Comment:        The GeneXpert MRSA Assay (FDA approved for NASAL specimens only), is one component of a comprehensive MRSA colonization surveillance program. It is not intended to diagnose MRSA infection nor to guide or monitor treatment for MRSA infections. Performed at [facility], [place] [address], [place],  [address]          Radiology Studies: Dg Hips Bilat With Pelvis 2v  Result Date: 04/16/2018 CLINICAL DATA:  84 year old female status post fall 3 days ago at  home. Right lateral hip pain. EXAM: DG HIP (WITH OR WITHOUT PELVIS) 2V BILAT COMPARISON:  None. FINDINGS: The femoral heads are normally located. Hip joint spaces appear symmetric and normal for age. Pelvis appears intact. SI joints within normal limits. Lower lumbar disc and endplate degeneration is noted. Intact proximal left femur. Intact proximal right femur. Bilateral femoral artery calcified atherosclerosis. Negative visible bowel gas pattern. IMPRESSION: No acute fracture or dislocation identified about the bilateral hips or pelvis. Electronically Signed   By: Genevie Ann M.D.   On: 04/16/2018 17:02        Scheduled Meds: . atorvastatin  40 mg Oral Daily  . carvedilol  3.125 mg Oral BID WC  . famotidine  40 mg Oral Daily  . feeding supplement  1 Container Oral TID BM  . gabapentin  600 mg Oral QHS  . mouth rinse  15 mL Mouth Rinse BID  . Melatonin  9 mg Sublingual QHS  . rOPINIRole  2 mg Oral BID  Continuous Infusions:    LOS: 2 days    Time spent: 56mn    PDomenic Polite MD [facility] Page via [email or web address], password [email or web address] After 7PM please contact night-coverage  04/17/2018, 1:18 PM

## 2018-04-17 NOTE — Progress Notes (Addendum)
Daily Rounding Note  04/17/2018, 10:06 AM  LOS: 2 days   SUBJECTIVE:   Chief complaint: mid and lower back pain.  No BM's reported.  Pt on clears.  Not very hungry.      OBJECTIVE:         Vital signs in last 24 hours:    Temp:  [97.8 F (36.6 C)-99.9 F (37.7 C)] 98.7 F (37.1 C) (06/18 0753) Pulse Rate:  [62-69] 64 (06/18 0753) Resp:  [15-21] 19 (06/18 0753) BP: (89-160)/(29-94) 126/57 (06/18 0753) SpO2:  [95 %-99 %] 97 % (06/18 0753) Last BM Date: 04/16/18 Filed Weights   04/15/18 1603 04/15/18 1800  Weight: 148 lb (67.1 kg) 156 lb 1.4 oz (70.8 kg)   General: somewhat frail looking, uncomfortable.  Not acutely ill   Heart: RRR with harsh syst murmer Chest: clear bil.  No dyspnea Abdomen: soft, NT, NT.  Active BS  Extremities: no CCE Neuro/Psych:  Oriented to year, hospital, self.    Intake/Output from previous day: 06/17 0701 - 06/18 0700 In: 977.3 [I.V.:639.8; Blood:337.5] Out: 2910 [Urine:2900; Blood:10]  Intake/Output this shift: No intake/output data recorded.  Lab Results: Recent Labs    04/15/18 2023 04/16/18 0412 04/17/18 0451  WBC 5.0 6.1 8.9  HGB 6.1* 7.5* 8.7*  HCT 18.7* 22.2* 26.2*  PLT 123* 176 164   BMET Recent Labs    04/15/18 0504 04/16/18 0412 04/17/18 0451  NA 125* 135 133*  K 4.2 4.1 4.0  CL 98* 106 101  CO2 21* 22 23  GLUCOSE 114* 103* 96  BUN 52* 40* 27*  CREATININE 1.36* 1.18* 1.12*  CALCIUM 8.0* 8.6* 8.5*   LFT No results for input(s): PROT, ALBUMIN, AST, ALT, ALKPHOS, BILITOT, BILIDIR, IBILI in the last 72 hours. PT/INR Recent Labs    04/15/18 0504  LABPROT 19.6*  INR 1.67   Hepatitis Panel No results for input(s): HEPBSAG, HCVAB, HEPAIGM, HEPBIGM in the last 72 hours.  Studies/Results: Dg Hips Bilat With Pelvis 2v  Result Date: 04/16/2018 CLINICAL DATA:  84 year old female status post fall 3 days ago at home. Right lateral hip pain. EXAM: DG HIP (WITH  OR WITHOUT PELVIS) 2V BILAT COMPARISON:  None. FINDINGS: The femoral heads are normally located. Hip joint spaces appear symmetric and normal for age. Pelvis appears intact. SI joints within normal limits. Lower lumbar disc and endplate degeneration is noted. Intact proximal left femur. Intact proximal right femur. Bilateral femoral artery calcified atherosclerosis. Negative visible bowel gas pattern. IMPRESSION: No acute fracture or dislocation identified about the bilateral hips or pelvis. Electronically Signed   By: Genevie Ann M.D.   On: 04/16/2018 17:02    ASSESMENT:   *   GIB with black stools in the month PTA.  Inconsistent compliance with PepcidPTA.   04/16/18 EGD: small HH.  3 non-bleeding gastric AVMs, ablated with APC laser.    *   Severe anemia.  S/p 4 U PRBCs.  Hgb 4.5 >>  8.7.  Hx chronic anemia.    *   AFib.  Chronic Coumadin on hold. S/p FFP x 2 U. Dr Fuller Plan rec to restart ~ 6/25 if no further bleeding or recurrent acute anemia.    *   Hx CVA.  Chronic Plavix on hold.  Hold for at least 1 week from 6/18  *  Heart murmer, Aortic stenosis.  no echo in records   PLAN   *  Consider dropping Plavix or coumadin, decision  needs to be made in conjunction with a cardiologist, and she is not established with a local cardiologist.  She moved to [place] within last few months.  Previous care was in [place] state.    *   Advance to [facility] diet.    *  Stop IV Protonix and switch to daily Pepcid.    *  As outpt will need periodic CBC as pt could have more AVMs not seen on EGD which potentially could bleed and lead to recurrent anemia.    Bethany Park  04/17/2018, 10:06 AM Phone [phone]     Attending physician's note   I have taken an interval history, reviewed the chart and examined the patient. I agree with the Advanced Practitioner's note, impression and recommendations.  Gastric AVMs, treated. No recurrent bleeding. Change to Pepcid. Advance diet as tolerated.  Chronic and ABL  anemia. Hb improved to 8.7 post transfusion. Trend CBC.  Hold Plavix and Coumadin for at least 1 week and primary service to review need for both medications. Perhaps one or both could be discontinued.  Interval outpatient CBCs and follow up with PCP.  GI follow up prn.  GI signing off.   Lucio Edward, MD FACG [phone] office

## 2018-04-17 NOTE — Telephone Encounter (Signed)
Noted. Daughter did state she will follow up with provider when pt is out of the hospital.

## 2018-04-17 NOTE — Evaluation (Addendum)
Physical Therapy Evaluation Patient Details Name: Bethany Park MRN: [id number] DOB: 08/14/1927 Today's Date: 04/17/2018   History of Present Illness  84 year old female with a PMH of CVA, HTN, afib on coumadin, and hyperlipidemia admitted for a severe anemia.  The patient reports feeling weak for the past several days, but over the last month she reports black stools.  She states that she told her daughter who attributed the discoloration to something that she was eating.  Two days prior to admission she had a fall without any injury.  She denies any issues with chest pain, SOB, nausea, or vomiting. She did does report a mild RLQ pain as well as GERD.  She takes GERD medications inconsistently and there is no recent use of NSAIDs.  Her last HGB on 07/21/2016 was at 9.9 g/dL.  And her admission HGB was 4.5 g/dL.  She underwent ESOPHAGOGASTRODUODENOSCOPY 04/16/18   Clinical Impression  Pt presents with impaired balance, gait and mobility. PT recommends continued acute PT services to address deficits as well as HHPT at d/c. PT recommends 24 hour supervision due to history of frequent falls and pt requiring min A/min guard for mobility at this time.    Follow Up Recommendations Supervision/Assistance - 24 hour;Home health PT    Equipment Recommendations  None recommended by PT    Recommendations for Other Services       Precautions / Restrictions Precautions Precautions: Fall Restrictions Weight Bearing Restrictions: No      Mobility  Bed Mobility               General bed mobility comments: pt in recliner on PT arrival  Transfers Overall transfer level: Needs assistance Equipment used: Rolling walker (2 wheeled) Transfers: Sit to/from [employer] Sit to Stand: Min assist Stand pivot transfers: Min assist       General transfer comment: pt requires assist due to posterior lean, cues for safety wtih use of RW  Ambulation/Gait Ambulation/Gait assistance: Min  assist Gait Distance (Feet): 100 Feet Assistive device: Rolling walker (2 wheeled)   Gait velocity: decreased   General Gait Details: pt requires cues for keeping RW safe, min A/min guard for balance especially with turns, pt with forward flexed trunk posture throughout  Stairs            Wheelchair Mobility    Modified Rankin (Stroke Patients Only)       Balance Overall balance assessment: Needs assistance           Standing balance-Leahy Scale: Fair Standing balance comment: min A with RW                             Pertinent Vitals/Pain Pain Assessment: No/denies pain    Home Living Family/patient expects to be discharged to:: Private residence Living Arrangements: Children Available Help at Discharge: Family;Available 24 hours/day Type of Home: House Home Access: Stairs to enter     Home Layout: One level Home Equipment: Environmental consultant - 4 wheels;Walker - 2 wheels      Prior Function Level of Independence: Needs assistance   Gait / Transfers Assistance Needed: needs assistance for stairs, able to walk with RW without assist, history of falls           Hand Dominance        Extremity/Trunk Assessment   Upper Extremity Assessment Upper Extremity Assessment: Generalized weakness    Lower Extremity Assessment Lower Extremity Assessment: Generalized weakness  Cervical / Trunk Assessment Cervical / Trunk Assessment: Kyphotic  Communication   Communication: No difficulties  Cognition Arousal/Alertness: Awake/alert Behavior During Therapy: WFL for tasks assessed/performed Overall Cognitive Status: Within Functional Limits for tasks assessed                                        General Comments      Exercises  Pt performed standing 2 x 10 mini squats, heel raises, marching with min guard with RW   Assessment/Plan    PT Assessment Patient needs continued PT services  PT Problem List Decreased  strength;Decreased mobility;Decreased activity tolerance;Cardiopulmonary status limiting activity;Decreased balance       PT Treatment Interventions DME instruction;Therapeutic activities;Gait training;Therapeutic exercise;Patient/family education;Stair training;Balance training;Neuromuscular re-education;Wheelchair mobility training    PT Goals (Current goals can be found in the Care Plan section)  Acute Rehab PT Goals Patient Stated Goal: go home PT Goal Formulation: With patient Time For Goal Achievement: 05/01/18 Potential to Achieve Goals: Good    Frequency Min 3X/week   Barriers to discharge        Co-evaluation               AM-PAC PT "6 Clicks" Daily Activity  Outcome Measure Difficulty turning over in bed (including adjusting bedclothes, sheets and blankets)?: A Little Difficulty moving from lying on back to sitting on the side of the bed? : A Little Difficulty sitting down on and standing up from a chair with arms (e.g., wheelchair, bedside commode, etc,.)?: A Little Help needed moving to and from a bed to chair (including a wheelchair)?: A Little Help needed walking in hospital room?: A Little Help needed climbing 3-5 steps with a railing? : A Lot 6 Click Score: 17    End of Session   Activity Tolerance: Patient tolerated treatment well Patient left: in chair;with call bell/phone within reach;with chair alarm set Nurse Communication: Mobility status PT Visit Diagnosis: Unsteadiness on feet (R26.81);Repeated falls (R29.6);Muscle weakness (generalized) (M62.81);Difficulty in walking, not elsewhere classified (R26.2)    Time: 4098-1191 PT Time Calculation (min) (ACUTE ONLY): 24 min   Charges:   PT Evaluation $PT Eval Moderate Complexity: 1 Mod PT Treatments $Therapeutic Activity: 8-22 mins   PT G Codes:        Bethany Park, PT, DPT  Bethany Park 04/17/2018, 10:34 AM

## 2018-04-18 ENCOUNTER — Encounter: Payer: Self-pay | Admitting: Cardiology

## 2018-04-18 ENCOUNTER — Ambulatory Visit (INDEPENDENT_AMBULATORY_CARE_PROVIDER_SITE_OTHER): Payer: Medicare Other | Admitting: Cardiology

## 2018-04-18 VITALS — BP 150/77 | HR 66 | Ht 64.0 in | Wt 149.1 lb

## 2018-04-18 DIAGNOSIS — I480 Paroxysmal atrial fibrillation: Secondary | ICD-10-CM

## 2018-04-18 DIAGNOSIS — I10 Essential (primary) hypertension: Secondary | ICD-10-CM

## 2018-04-18 DIAGNOSIS — E7800 Pure hypercholesterolemia, unspecified: Secondary | ICD-10-CM | POA: Diagnosis not present

## 2018-04-18 DIAGNOSIS — I350 Nonrheumatic aortic (valve) stenosis: Secondary | ICD-10-CM | POA: Diagnosis not present

## 2018-04-18 DIAGNOSIS — I2510 Atherosclerotic heart disease of native coronary artery without angina pectoris: Secondary | ICD-10-CM

## 2018-04-18 MED ORDER — HYDROCODONE-ACETAMINOPHEN 5-325 MG PO TABS: 1.0000 | ORAL_TABLET | Freq: Four times a day (QID) | ORAL | 0 refills | Status: DC | PRN

## 2018-04-18 MED ORDER — FAMOTIDINE 40 MG PO TABS
40.0000 mg | ORAL_TABLET | Freq: Every day | ORAL | 0 refills | Status: DC
Start: 2018-04-19 — End: 2019-11-11

## 2018-04-18 MED ORDER — CLOPIDOGREL BISULFATE 75 MG PO TABS: 75.0000 mg | ORAL_TABLET | Freq: Every day | ORAL | 3 refills | Status: DC

## 2018-04-18 MED ORDER — WARFARIN SODIUM 5 MG PO TABS: ORAL_TABLET | ORAL | 3 refills | Status: DC

## 2018-04-18 NOTE — Discharge Summary (Signed)
Physician Discharge Summary  [place] XVQ:[id number] DOB: 25-Mar-1927 DOA: 04/14/2018  PCP: Emeterio Reeve, DO  Admit date: 04/14/2018 Discharge date: 04/18/2018  Admitted From: Home Disposition:  Home  Discharge Condition:Stable CODE STATUS:DNR Diet recommendation: Heart Healthy   Brief/Interim Summary:  Patient  is an 84/F with Dementia, CAD/PCI stents on plavix, H/o CVA, AFib on Coumadin , admitted with melena, weakness and falls recently Her Hb was 4.5 on admission with INR of 2.2.CT abd showed diverticulosis, Report of colonoscopy from 2014 with diverticula and polyps.We held Plavix and Coumadin, and she was given FFP and blood transfusions with improvement in her hemoglobin.  GI was following her and she underwent esophagogastroduodenoscopy.  She was found to have gastric AVMs which were treated.  No recurrent bleeding.  Started on Pepcid.  Currently her hemoglobin is stable.  The plan is to completely discontinue the Plavix and she might be considered to continue on Coumadin on after 1 week if her PCP plans to do so. This morning she was comfortable with no active issues or events.  She denies any abdominal pain.  She has chronic back pain and she benefits with outpatient follow-up with pain management clinic.  Following problems were addressed during her hospitalization:    Upper GI bleed/melena -Admitted with hemoglobin of 4.5, history of ongoing melena for weeks to months -Given 4 units of PRBC,  FFP and vitamin K -GI was following  underwent EGD  which noted gastric AVMs  Treated with APC.(Three bleeding angiodysplastic lesions in thebgastric cardia. Treated with argon plasma coagulation) -Started on Pepcid as per GI recommendation -Plavix permanently stopped and Coumadin on hold.can restart Coumadin after discussion with the PCP as an outpatient   CAD -obtained records from [facility] in [place] -She had severe single-vessel coronary artery disease  involving 2 high-grade lesions in mid LAD and severe stenosis in the ostium of first diagonal branch treated with PCI to mid LAD with 1 DES and PCI to bifurcation of mid LAD and diagonal with 2 DES stents, per Cath note 09/2017, recommended dual antiplatelet therapy for at least 6 months with aspirin 81 mg indefinitely thereafter -since she has completed 6 months of Plavix -Needs to establish with new cardiologist in [facility]  Suspected aortic stenosis -She has large ejection systolic murmur -no plan to further workup this up, in the current setting  Atrial Fibrillation:  -CHA2DS2-VASc Scoreis ,7 -Patient is on Coumadin at baseline, -Continue Coreg, Coumadin held in the setting of active upper GI bleed  Essential hypertension: -Coreg restarted, monitor  HLD (hyperlipidemia) -Continue Lipitor  History of CVA (cerebrovascular accident): -Continue Lipitor -Holding Coumadin  Depression: -Continue Requip  Fall:CT of the head negative. -Physical therapy recommended home health PT  Hyponatremia: Na 124 on admission -resolved with hydration  AoCKD-III: -likely hemodynamically mediated, resolved with volume expansion/blood transfusion      Discharge Diagnoses:  Principal Problem:   UGIB (upper gastrointestinal bleed) Active Problems:   Atrial fibrillation ([place])   Essential hypertension   History of coronary artery stent placement   HLD (hyperlipidemia)   History of CVA (cerebrovascular accident)   Anemia, blood loss   Depression   CAD (coronary artery disease)   Fall   Hyponatremia   Acute renal failure superimposed on stage 3 chronic kidney disease ([place])   UTI (urinary tract infection)   Melena    Discharge Instructions  Discharge Instructions    Diet - low sodium heart healthy   Complete by:  As directed  Discharge instructions   Complete by:  As directed    1) Take prescribed medications as instructed. 2) Do not take warfarin until you  see your primary care physician.  If your PCP plans to put you on Coumadin, start taking only after 1 week of discharge.  Stop taking any NSAIDs. 3) do a CBC and BMP test during the follow-up with her PCP. 4) Take prescribed medications as instructed. 5) Follow up with cardiology as an outpatient.  Name and number of the provider has been attached.  You can also find a cardiologist thorugh the referral of your PCP.   Increase activity slowly   Complete by:  As directed      Allergies as of 04/18/2018      Reactions   Alendronate Sodium    Benztropine Mesylate [benztropine]    Citalopram    Cymbalta [duloxetine Hcl]    Erythromycin    Fluoxetine Hcl    Glimepiride    Nortriptyline Hcl    Penicillins    Sulfa Antibiotics    Seroquel [quetiapine Fumarate] Itching, Other (See Comments)   As per daughter, hallucinations.      Medication List    STOP taking these medications   clopidogrel 75 MG tablet Commonly known as:  PLAVIX     TAKE these medications   atorvastatin 40 MG tablet Commonly known as:  LIPITOR Take 1 tablet (40 mg total) by mouth daily.   carvedilol 3.125 MG tablet Commonly known as:  COREG Take 1 tablet (3.125 mg total) by mouth 2 (two) times daily with a meal.   famotidine 40 MG tablet Commonly known as:  PEPCID Take 1 tablet (40 mg total) by mouth daily. Start taking on:  04/19/2018 What changed:    medication strength  how much to take  when to take this   gabapentin 300 MG capsule Commonly known as:  NEURONTIN Take 1 capsule (300 mg total) by mouth at bedtime for 3 days, THEN 2 capsules (600 mg total) at bedtime. Start taking on:  03/27/2018 What changed:  See the new instructions.   HYDROcodone-acetaminophen 5-325 MG tablet Commonly known as:  NORCO Take 1 tablet by mouth every 6 (six) hours as needed for severe pain.   lisinopril 2.5 MG tablet Commonly known as:  PRINIVIL,ZESTRIL Take 1 tablet (2.5 mg total) by mouth daily.   LORazepam  0.5 MG tablet Commonly known as:  ATIVAN Take 1 tablet (0.5 mg total) by mouth 2 (two) times daily as needed for anxiety. DO NOT TAKE in evenings w/ Temapzepam   Melatonin 10 MG Subl Place 10 mg under the tongue at bedtime.   nitroGLYCERIN 0.4 MG SL tablet Commonly known as:  NITROSTAT Place 1 tablet (0.4 mg total) under the tongue every 5 (five) minutes as needed for chest pain. If no better 10-15 mins, go to hospital   rOPINIRole 2 MG tablet Commonly known as:  REQUIP Take 1 tablet (2 mg total) by mouth 2 (two) times daily.   warfarin 5 MG tablet Commonly known as:  COUMADIN Take as directed. If you are unsure how to take this medication, talk to your nurse or doctor. Original instructions:  Except 7.5 mg Weds, Saturday Please take only after a week of discharge date after talking to your primary care physician What changed:    how much to take  how to take this  when to take this  additional instructions      Follow-up Information    Emeterio Reeve,  DO. Schedule an appointment as soon as possible for a visit in 1 week(s).   Specialty:  Osteopathic Medicine Contact information: [address] [place] [address] [phone]        Leonie Man, MD. Schedule an appointment as soon as possible for a visit in 2 week(s).   Specialty:  Cardiology Contact information: [address] [place] [address] [phone]          Allergies  Allergen Reactions  . Alendronate Sodium   . Benztropine Mesylate [Benztropine]   . Citalopram   . Cymbalta [Duloxetine Hcl]   . Erythromycin   . Fluoxetine Hcl   . Glimepiride   . Nortriptyline Hcl   . Penicillins   . Sulfa Antibiotics   . Seroquel [Quetiapine Fumarate] Itching and Other (See Comments)    As per daughter, hallucinations.    Consultations: GI  Procedures/Studies: Ct Abdomen Pelvis Wo Contrast  Result Date: 04/15/2018 CLINICAL DATA:  84 year old with acute  abdominal pain. Lethargy. Stated history of blunt abdominal trauma. EXAM: CT ABDOMEN AND PELVIS WITHOUT CONTRAST TECHNIQUE: Multidetector CT imaging of the abdomen and pelvis was performed following the standard protocol without IV contrast. COMPARISON:  None. FINDINGS: Lower chest: Minimal subpleural reticulation in the right middle and right lower lobes. No focal consolidation. No pleural fluid. There are coronary artery calcifications. Hepatobiliary: No discrete focal hepatic lesion. Postcholecystectomy with expected postcholecystectomy biliary prominence. Common bile duct measures 9 mm. Pancreas: Few parenchymal calcifications in the distal body and tail. No ductal dilatation or inflammation. Spleen: Normal in size without focal abnormality. Adrenals/Urinary Tract: No adrenal nodule. No hydronephrosis. Mild symmetric perinephric edema. No urolithiasis. Urinary bladder is physiologically distended without wall thickening. Stomach/Bowel: Sigmoid colonic diverticulosis without diverticulitis. Moderate stool burden in the colon. No bowel wall thickening, inflammatory change or obstruction. The appendix is not visualized. Stomach distended with ingested contents. Vascular/Lymphatic: Aortic atherosclerosis without aneurysm. No enlarged abdominal or pelvic lymph nodes. Reproductive: Post hysterectomy.  No adnexal mass. Other: Postsurgical change of the anterior abdominal wall. No free air, free fluid, or intra-abdominal fluid collection. Musculoskeletal: The bones are under mineralized. Degenerative change at the pubic symphysis. Multilevel degenerative change throughout the lumbar spine. IMPRESSION: 1. Mild symmetric bilateral perinephric edema, non-specific and may be chronic. Recommend correlation with urinalysis to exclude urinary tract infection. 2. Colonic diverticulosis without diverticulitis. 3.  Aortic Atherosclerosis (ICD10-I70.0). Electronically Signed   By: Jeb Levering M.D.   On: 04/15/2018 03:15    Dg Chest 2 View  Result Date: 04/15/2018 CLINICAL DATA:  Chest pain EXAM: CHEST - 2 VIEW COMPARISON:  None. FINDINGS: No focal opacity or pleural effusion. Mild cardiomegaly. Tortuous aorta with atherosclerosis. No pneumothorax. Right shoulder replacement. IMPRESSION: No active cardiopulmonary disease.  Mild cardiomegaly. Electronically Signed   By: Donavan Foil M.D.   On: 04/15/2018 02:46   Ct Head Wo Contrast  Result Date: 04/15/2018 CLINICAL DATA:  Hypotensive and lethargic EXAM: CT HEAD WITHOUT CONTRAST TECHNIQUE: Contiguous axial images were obtained from the base of the skull through the vertex without intravenous contrast. COMPARISON:  None. FINDINGS: Brain: No acute territorial infarction, hemorrhage or intracranial mass. Encephalomalacia in the left posterior parietal and occipital lobe superior atrophy. Mild small vessel ischemic changes of the white matter. Nonenlarged ventricles. Mild ex vacuo dilatation of the left posterior ventricle. Vascular: No hyperdense vessels.  Carotid vascular calcification. Skull: No fracture Sinuses/Orbits: Mucosal thickening in the ethmoid sinuses. Near complete opacification of right maxillary sinus. Probable old  nasal bone fracture Other: None IMPRESSION: 1. No definite CT evidence for acute intracranial abnormality. 2. Atrophy and small vessel ischemic changes of the white matter. Encephalomalacia in the left posterior parietal and occipital lobes. Electronically Signed   By: Donavan Foil M.D.   On: 04/15/2018 03:10   Dg Hips Bilat With Pelvis 2v  Result Date: 04/16/2018 CLINICAL DATA:  84 year old female status post fall 3 days ago at home. Right lateral hip pain. EXAM: DG HIP (WITH OR WITHOUT PELVIS) 2V BILAT COMPARISON:  None. FINDINGS: The femoral heads are normally located. Hip joint spaces appear symmetric and normal for age. Pelvis appears intact. SI joints within normal limits. Lower lumbar disc and endplate degeneration is noted. Intact proximal  left femur. Intact proximal right femur. Bilateral femoral artery calcified atherosclerosis. Negative visible bowel gas pattern. IMPRESSION: No acute fracture or dislocation identified about the bilateral hips or pelvis. Electronically Signed   By: Genevie Ann M.D.   On: 04/16/2018 17:02      Subjective: Patient seen and examined at bedside this morning.  Remains comfortable but complains of lower back pain which is a chronic issue.  She is hemodynamically stable and   Hemoglobin this morning is stable.  She is stable for discharge home today.  Discharge Exam: Vitals:   04/18/18 0750 04/18/18 0817  BP: (!) 145/58   Pulse: 62   Resp: 16   Temp:  98.2 F (36.8 C)  SpO2: 100%    Vitals:   04/18/18 0433 04/18/18 0503 04/18/18 0750 04/18/18 0817  BP:  (!) 150/75 (!) 145/58   Pulse:  69 62   Resp:  18 16   Temp: 98.1 F (36.7 C)   98.2 F (36.8 C)  TempSrc: Oral   Oral  SpO2:  100% 100%   Weight:      Height:        General: Pt is alert, awake, not in acute distress Cardiovascular: RRR, S1/S2 +, no rubs, no gallops Respiratory: CTA bilaterally, no wheezing, no rhonchi Abdominal: Soft, NT, ND, bowel sounds + Extremities: no edema, no cyanosis    The results of significant diagnostics from this hospitalization (including imaging, microbiology, ancillary and laboratory) are listed below for reference.     Microbiology: Recent Results (from the past 240 hour(s))  Urine Culture     Status: Abnormal   Collection Time: 04/15/18  5:21 AM  Result Value Ref Range Status   Specimen Description URINE, RANDOM  Final   Special Requests   Final    NONE Performed at [facility], [address] [place], [place] [address]    Culture MULTIPLE SPECIES PRESENT, SUGGEST RECOLLECTION (A)  Final   Report Status 04/16/2018 FINAL  Final  Culture, blood (Routine X 2) w Reflex to ID Panel     Status: None (Preliminary result)   Collection Time: 04/15/18  5:58 PM  Result Value Ref Range  Status   Specimen Description BLOOD RIGHT ANTECUBITAL  Final   Special Requests   Final    BOTTLES DRAWN AEROBIC AND ANAEROBIC Blood Culture results may not be optimal due to an inadequate volume of blood received in culture bottles   Culture   Final    NO GROWTH 2 DAYS Performed at [facility], [place] [address], [place], [place] [address]    Report Status PENDING  Incomplete  Culture, blood (Routine X 2) w Reflex to ID Panel     Status: None (Preliminary result)   Collection  Time: 04/15/18  5:58 PM  Result Value Ref Range Status   Specimen Description BLOOD LEFT HAND  Final   Special Requests   Final    BOTTLES DRAWN AEROBIC AND ANAEROBIC Blood Culture results may not be optimal due to an inadequate volume of blood received in culture bottles   Culture   Final    NO GROWTH 2 DAYS Performed at [facility], [place] [address], [place], [place] [address]    Report Status PENDING  Incomplete  MRSA PCR Screening     Status: None   Collection Time: 04/15/18  6:20 PM  Result Value Ref Range Status   MRSA by PCR NEGATIVE NEGATIVE Final    Comment:        The GeneXpert MRSA Assay (FDA approved for NASAL specimens only), is one component of a comprehensive MRSA colonization surveillance program. It is not intended to diagnose MRSA infection nor to guide or monitor treatment for MRSA infections. Performed at [facility], [place] [address], [place], [place] [address]      Labs: BNP (last 3 results) No results for input(s): BNP in the last 8760 hours. Basic Metabolic Panel: Recent Labs  Lab 04/14/18 2354 04/15/18 0504 04/16/18 0412 04/17/18 0451  NA 124* 125* 135 133*  K 4.1 4.2 4.1 4.0  CL 94* 98* 106 101  CO2 21* 21* 22 23  GLUCOSE 113* 114* 103* 96  BUN 57* 52* 40* 27*  CREATININE 1.52* 1.36* 1.18* 1.12*  CALCIUM 8.3* 8.0* 8.6* 8.5*   Liver Function Tests: No results for input(s): AST, ALT, ALKPHOS, BILITOT, PROT, ALBUMIN in the last 168 hours. No results  for input(s): LIPASE, AMYLASE in the last 168 hours. No results for input(s): AMMONIA in the last 168 hours. CBC: Recent Labs  Lab 04/14/18 2354 04/15/18 0504 04/15/18 2023 04/16/18 0412 04/17/18 0451  WBC 7.4 6.9 5.0 6.1 8.9  NEUTROABS  --   --   --  4.7  --   HGB 4.5* 4.5* 6.1* 7.5* 8.7*  HCT 14.0* 14.6* 18.7* 22.2* 26.2*  MCV 102.2* 105.8* 91.2 89.5 92.6  PLT 172 170 123* 176 164   Cardiac Enzymes: Recent Labs  Lab 04/15/18 0504  TROPONINI <0.03   BNP: Invalid input(s): POCBNP CBG: No results for input(s): GLUCAP in the last 168 hours. D-Dimer No results for input(s): DDIMER in the last 72 hours. Hgb A1c No results for input(s): HGBA1C in the last 72 hours. Lipid Profile No results for input(s): CHOL, HDL, LDLCALC, TRIG, CHOLHDL, LDLDIRECT in the last 72 hours. Thyroid function studies No results for input(s): TSH, T4TOTAL, T3FREE, THYROIDAB in the last 72 hours.  Invalid input(s): FREET3 Anemia work up No results for input(s): VITAMINB12, FOLATE, FERRITIN, TIBC, IRON, RETICCTPCT in the last 72 hours. Urinalysis    Component Value Date/Time   COLORURINE STRAW (A) 04/14/2018 2345   APPEARANCEUR CLEAR 04/14/2018 2345   LABSPEC 1.011 04/14/2018 2345   PHURINE 5.0 04/14/2018 2345   GLUCOSEU NEGATIVE 04/14/2018 2345   HGBUR SMALL (A) 04/14/2018 2345   BILIRUBINUR NEGATIVE 04/14/2018 2345   KETONESUR NEGATIVE 04/14/2018 2345   PROTEINUR NEGATIVE 04/14/2018 2345   NITRITE NEGATIVE 04/14/2018 2345   LEUKOCYTESUR MODERATE (A) 04/14/2018 2345   Sepsis Labs Invalid input(s): PROCALCITONIN,  WBC,  LACTICIDVEN Microbiology Recent Results (from the past 240 hour(s))  Urine Culture     Status: Abnormal   Collection Time: 04/15/18  5:21 AM  Result Value Ref Range Status   Specimen Description  URINE, RANDOM  Final   Special Requests   Final    NONE Performed at [facility], [place] [address], [place], [place] [address]    Culture MULTIPLE SPECIES PRESENT,  SUGGEST RECOLLECTION (A)  Final   Report Status 04/16/2018 FINAL  Final  Culture, blood (Routine X 2) w Reflex to ID Panel     Status: None (Preliminary result)   Collection Time: 04/15/18  5:58 PM  Result Value Ref Range Status   Specimen Description BLOOD RIGHT ANTECUBITAL  Final   Special Requests   Final    BOTTLES DRAWN AEROBIC AND ANAEROBIC Blood Culture results may not be optimal due to an inadequate volume of blood received in culture bottles   Culture   Final    NO GROWTH 2 DAYS Performed at [facility], [place] [address] [place], [place] [address]    Report Status PENDING  Incomplete  Culture, blood (Routine X 2) w Reflex to ID Panel     Status: None (Preliminary result)   Collection Time: 04/15/18  5:58 PM  Result Value Ref Range Status   Specimen Description BLOOD LEFT HAND  Final   Special Requests   Final    BOTTLES DRAWN AEROBIC AND ANAEROBIC Blood Culture results may not be optimal due to an inadequate volume of blood received in culture bottles   Culture   Final    NO GROWTH 2 DAYS Performed at [facility], [place] [address] [place], [place] [address]    Report Status PENDING  Incomplete  MRSA PCR Screening     Status: None   Collection Time: 04/15/18  6:20 PM  Result Value Ref Range Status   MRSA by PCR NEGATIVE NEGATIVE Final    Comment:        The GeneXpert MRSA Assay (FDA approved for NASAL specimens only), is one component of a comprehensive MRSA colonization surveillance program. It is not intended to diagnose MRSA infection nor to guide or monitor treatment for MRSA infections. Performed at [facility], [place] [address], [place], [place] [address]     Please note: You were cared for by a hospitalist during your hospital stay. Once you are discharged, your primary care physician will handle any further medical issues. Please note that NO REFILLS for any discharge medications will be authorized once you are discharged, as it is  imperative that you return to your primary care physician (or establish a relationship with a primary care physician if you do not have one) for your post hospital discharge needs so that they can reassess your need for medications and monitor your lab values.    Time coordinating discharge: 40 minutes  SIGNED:   Shelly Coss, MD  [facility] 04/18/2018, 11:07 AM Pager [phone]  If 7PM-7AM, please contact night-coverage [email or web address] Password [email or web address]

## 2018-04-18 NOTE — Patient Instructions (Signed)
Medication Instructions:   STOP WARFARIN  START PLAVIX 75 MG ONCE DAILY  Follow-Up:  Your physician recommends that you schedule a follow-up appointment in: [place]

## 2018-04-18 NOTE — Care Management Note (Signed)
Case Management Note  Patient Details  Name: Anyelin Mogle MRN: [id number] Date of Birth: [birth year]  Subjective/Objective:       Pt admitted with UGIB. She is from home with daughter.              Action/Plan: Pt discharging home with daughter and resumption of [facility] services. Pt was active with [facility] prior to admission. CM called [facility] and faxed them the required information. Daughter providing transportation home.   Expected Discharge Date:  04/18/18               Expected Discharge Plan:  [place]  In-House Referral:     Discharge planning Services  CM Consult  Post Acute Care Choice:  Home Health Choice offered to:  Adult Children  DME Arranged:    DME Agency:     HH Arranged:  PT, OT, RN [place] Agency:  [facility]  Status of Service:  Completed, signed off  If discussed at [facility] of Stay Meetings, dates discussed:    Additional Comments:  Pollie Friar, RN 04/18/2018, 2:26 PM

## 2018-04-19 DIAGNOSIS — I481 Persistent atrial fibrillation: Secondary | ICD-10-CM | POA: Diagnosis not present

## 2018-04-19 DIAGNOSIS — E539 Vitamin B deficiency, unspecified: Secondary | ICD-10-CM | POA: Diagnosis not present

## 2018-04-19 DIAGNOSIS — J449 Chronic obstructive pulmonary disease, unspecified: Secondary | ICD-10-CM | POA: Diagnosis not present

## 2018-04-19 DIAGNOSIS — F0390 Unspecified dementia without behavioral disturbance: Secondary | ICD-10-CM | POA: Diagnosis not present

## 2018-04-19 DIAGNOSIS — I2510 Atherosclerotic heart disease of native coronary artery without angina pectoris: Secondary | ICD-10-CM | POA: Diagnosis not present

## 2018-04-19 DIAGNOSIS — D649 Anemia, unspecified: Secondary | ICD-10-CM | POA: Diagnosis not present

## 2018-04-20 LAB — CULTURE, BLOOD (ROUTINE X 2)
CULTURE: NO GROWTH
Culture: NO GROWTH

## 2018-04-23 DIAGNOSIS — Q667 Congenital pes cavus: Secondary | ICD-10-CM | POA: Diagnosis not present

## 2018-04-23 DIAGNOSIS — L602 Onychogryphosis: Secondary | ICD-10-CM | POA: Diagnosis not present

## 2018-04-23 DIAGNOSIS — M7742 Metatarsalgia, left foot: Secondary | ICD-10-CM | POA: Diagnosis not present

## 2018-04-23 DIAGNOSIS — M7741 Metatarsalgia, right foot: Secondary | ICD-10-CM | POA: Diagnosis not present

## 2018-04-23 DIAGNOSIS — G600 Hereditary motor and sensory neuropathy: Secondary | ICD-10-CM | POA: Diagnosis not present

## 2018-04-24 ENCOUNTER — Inpatient Hospital Stay: Payer: Medicare Other | Admitting: Osteopathic Medicine

## 2018-04-24 DIAGNOSIS — F0390 Unspecified dementia without behavioral disturbance: Secondary | ICD-10-CM | POA: Diagnosis not present

## 2018-04-24 DIAGNOSIS — I481 Persistent atrial fibrillation: Secondary | ICD-10-CM | POA: Diagnosis not present

## 2018-04-24 DIAGNOSIS — J449 Chronic obstructive pulmonary disease, unspecified: Secondary | ICD-10-CM | POA: Diagnosis not present

## 2018-04-24 DIAGNOSIS — I2510 Atherosclerotic heart disease of native coronary artery without angina pectoris: Secondary | ICD-10-CM | POA: Diagnosis not present

## 2018-04-24 DIAGNOSIS — E539 Vitamin B deficiency, unspecified: Secondary | ICD-10-CM | POA: Diagnosis not present

## 2018-04-24 DIAGNOSIS — D649 Anemia, unspecified: Secondary | ICD-10-CM | POA: Diagnosis not present

## 2018-04-25 ENCOUNTER — Encounter: Payer: Self-pay | Admitting: Osteopathic Medicine

## 2018-04-25 LAB — INR (THROMBOREL-S): INR: 2.5

## 2018-04-26 ENCOUNTER — Ambulatory Visit (INDEPENDENT_AMBULATORY_CARE_PROVIDER_SITE_OTHER): Payer: Medicare Other | Admitting: Osteopathic Medicine

## 2018-04-26 ENCOUNTER — Encounter: Payer: Self-pay | Admitting: Osteopathic Medicine

## 2018-04-26 VITALS — BP 122/45 | HR 75 | Temp 98.7°F | Wt 152.1 lb

## 2018-04-26 DIAGNOSIS — Z79899 Other long term (current) drug therapy: Secondary | ICD-10-CM | POA: Diagnosis not present

## 2018-04-26 DIAGNOSIS — R262 Difficulty in walking, not elsewhere classified: Secondary | ICD-10-CM | POA: Diagnosis not present

## 2018-04-26 DIAGNOSIS — G8929 Other chronic pain: Secondary | ICD-10-CM | POA: Insufficient documentation

## 2018-04-26 DIAGNOSIS — K922 Gastrointestinal hemorrhage, unspecified: Secondary | ICD-10-CM | POA: Diagnosis not present

## 2018-04-26 DIAGNOSIS — M25551 Pain in right hip: Secondary | ICD-10-CM

## 2018-04-26 DIAGNOSIS — M545 Low back pain, unspecified: Secondary | ICD-10-CM

## 2018-04-26 DIAGNOSIS — F0391 Unspecified dementia with behavioral disturbance: Secondary | ICD-10-CM | POA: Diagnosis not present

## 2018-04-26 DIAGNOSIS — G44229 Chronic tension-type headache, not intractable: Secondary | ICD-10-CM

## 2018-04-26 DIAGNOSIS — I2510 Atherosclerotic heart disease of native coronary artery without angina pectoris: Secondary | ICD-10-CM

## 2018-04-26 DIAGNOSIS — G2581 Restless legs syndrome: Secondary | ICD-10-CM

## 2018-04-26 MED ORDER — AMITRIPTYLINE HCL 25 MG PO TABS: 25.0000 mg | ORAL_TABLET | Freq: Every day | ORAL | 0 refills | Status: DC

## 2018-04-26 MED ORDER — AMBULATORY NON FORMULARY MEDICATION: 99 refills | Status: AC

## 2018-04-26 MED ORDER — HYDROCODONE-ACETAMINOPHEN 5-325 MG PO TABS: 1.0000 | ORAL_TABLET | Freq: Four times a day (QID) | ORAL | 0 refills | Status: DC | PRN

## 2018-04-26 NOTE — Progress Notes (Addendum)
HPI: Bethany Park is a 84 y.o. female who  has a past medical history of Anemia, Aortic stenosis, Atrial fibrillation ([place]), B12 deficiency (03/13/2018), Colon polyp (09/2013), Dementia, Depression with anxiety (03/13/2018), Essential hypertension (03/13/2018), Glucose intolerance (03/13/2018), Heart attack ([place]), Heart failure ([place]), History of COPD, History of coronary artery stent placement (09/2017), Moderate aortic stenosis (03/13/2018), Osteoporosis (03/13/2018), Ovarian cancer ([place]), Restless leg syndrome (03/13/2018), and Stroke ([place]).  she presents to [facility] today, 04/26/18,  for chief complaint of:  Hospital follow-up  In hospital for GI bleed 04/14/2018, had been on Coumadin and Plavix per previous cardiology recommendations due to placement of cardiac stent about 6 months ago.  Per cardiology recommendations, Coumadin has since been discontinued.  Patient was given 4 units packed red blood cells, FFP, vitamin K.  EGD noted gastric AVMs, treated with APC.  Started on Pepcid.  Today, patient is "in a mood" and a little cranky.  She reports backache and headache.  Hydrocodone was helping and she requests refill of this to take once per day/as needed.  Daughter requests prescription for hospital bed and wheelchair.  No additional episodes of bleeding/bloody stool.  Patient states her energy levels are good.    Patient's greatest complaint at this point is headache.  She reports chronic frontal and occipital squeezing type headache.  Previous diagnosis of migraines.  Patient is accompanied by daughter, Bethany Park, who assists with history-taking.   Past medical history, surgical history, and family history reviewed.  Current medication list and allergy/intolerance information reviewed.   (See remainder of HPI, ROS, Phys Exam below)    ASSESSMENT/PLAN:   Dementia with behavioral disturbance, unspecified dementia type  Ambulatory dysfunction - Plan:  AMBULATORY NON FORMULARY MEDICATION, AMBULATORY NON FORMULARY MEDICATION.  Patient needs a wheelchair for every day activities of daily living, cannot use cane or walker.  Patient needs hospital bed for body positioning and ways not feasible with an ordinary bed.  Restless leg syndrome  Chronic tension-type headache, not intractable - Trial amitriptyline.  Unknown previous reaction to nortriptyline, daughter will watch out for this, no history of anaphylaxis with anything except penicillin - Plan: amitriptyline (ELAVIL) 25 MG tablet  Gastrointestinal hemorrhage, unspecified gastrointestinal hemorrhage type - Plan: CBC, COMPLETE METABOLIC PANEL WITH GFR, Fe+TIBC+Fer  Chronic bilateral low back pain without sciatica - Plan: HYDROcodone-acetaminophen (NORCO) 5-325 MG tablet  Chronic right hip pain - Plan: HYDROcodone-acetaminophen (NORCO) 5-325 MG tablet  Polypharmacy - Continue discussion of risks versus benefits of medication.  Patient and daughter are aware of risk for falls, delirium, sedation, death- QOL worth it to them.    Meds ordered this encounter  Medications  . HYDROcodone-acetaminophen (NORCO) 5-325 MG tablet    Sig: Take 1 tablet by mouth every 6 (six) hours as needed for severe pain. #45 for 30 days    Dispense:  45 tablet    Refill:  0  . AMBULATORY NON FORMULARY MEDICATION    Sig: Hospital bed with rails    Dispense:  1 Units    Refill:  prn  . amitriptyline (ELAVIL) 25 MG tablet    Sig: Take 1 tablet (25 mg total) by mouth at bedtime.    Dispense:  90 tablet    Refill:  0  . AMBULATORY NON FORMULARY MEDICATION    Sig: Wheelchair    Dispense:  1 Units    Refill:  prn     Follow-up plan: Return in about 2 weeks (around 05/10/2018) for recheck on  medications .     ############################################ ############################################ ############################################ ############################################    Outpatient  Encounter Medications as of 04/26/2018  Medication Sig  . atorvastatin (LIPITOR) 40 MG tablet Take 1 tablet (40 mg total) by mouth daily.  . carvedilol (COREG) 3.125 MG tablet Take 1 tablet (3.125 mg total) by mouth 2 (two) times daily with a meal.  . clopidogrel (PLAVIX) 75 MG tablet Take 1 tablet (75 mg total) by mouth daily.  . famotidine (PEPCID) 40 MG tablet Take 1 tablet (40 mg total) by mouth daily.  Marland Kitchen gabapentin (NEURONTIN) 300 MG capsule Take 1 capsule (300 mg total) by mouth at bedtime for 3 days, THEN 2 capsules (600 mg total) at bedtime. (Patient taking differently: Take 2 capsules (600 mg total) at bedtime.)  . HYDROcodone-acetaminophen (NORCO) 5-325 MG tablet Take 1 tablet by mouth every 6 (six) hours as needed for severe pain. #45 for 30 days  . lisinopril (PRINIVIL,ZESTRIL) 2.5 MG tablet Take 1 tablet (2.5 mg total) by mouth daily.  Marland Kitchen LORazepam (ATIVAN) 0.5 MG tablet Take 1 tablet (0.5 mg total) by mouth 2 (two) times daily as needed for anxiety. DO NOT TAKE in evenings w/ Temapzepam  . Melatonin 10 MG SUBL Place 10 mg under the tongue at bedtime.  . nitroGLYCERIN (NITROSTAT) 0.4 MG SL tablet Place 1 tablet (0.4 mg total) under the tongue every 5 (five) minutes as needed for chest pain. If no better 10-15 mins, go to hospital  . rOPINIRole (REQUIP) 2 MG tablet Take 1 tablet (2 mg total) by mouth 2 (two) times daily.  . [DISCONTINUED] HYDROcodone-acetaminophen (NORCO) 5-325 MG tablet Take 1 tablet by mouth every 6 (six) hours as needed for severe pain.  Marland Kitchen [facility] bed with rails  . AMBULATORY NON FORMULARY MEDICATION Wheelchair  . amitriptyline (ELAVIL) 25 MG tablet Take 1 tablet (25 mg total) by mouth at bedtime.   No facility-administered encounter medications on file as of 04/26/2018.    Allergies  Allergen Reactions  . Alendronate Sodium   . Benztropine Mesylate [Benztropine]   . Citalopram   . Cymbalta [Duloxetine Hcl]   . Erythromycin    . Fluoxetine Hcl   . Glimepiride   . Nortriptyline Hcl   . Penicillins   . Sulfa Antibiotics   . Seroquel [Quetiapine Fumarate] Other (See Comments)    As per daughter, hallucinations.      Review of Systems:  Constitutional: +recent illness as per HPI, energy levels now are good  HEENT: +headache, no vision change  Cardiac: No  chest pain, No  pressure, No palpitations  Respiratory:  No  shortness of breath. No  Cough  Gastrointestinal: No  abdominal pain, no change on bowel habits  Musculoskeletal: + myalgia/arthralgia  Skin: No  Rash  Hem/Onc: No  easy bruising/bleeding, No  abnormal lumps/bumps  Neurologic: No  weakness, No  Dizziness   Exam:  BP (!) 122/45 (BP Location: Left Arm, Patient Position: Sitting, Cuff Size: Normal)   Pulse 75   Temp 98.7 F (37.1 C) (Oral)   Wt 152 lb 1.6 oz (69 kg)   BMI 26.11 kg/m   Constitutional: VS see above. General Appearance: alert, well-developed, well-nourished, NAD  Eyes: Normal lids and conjunctive, non-icteric sclera  Ears, Nose, Mouth, Throat: MMM, Normal external inspection ears/nares/mouth/lips/gums.  Neck: No masses, trachea midline.   Respiratory: Normal respiratory effort. no wheeze, no rhonchi, no rales  Cardiovascular: S1/S2 normal, +4/6 murmur, no rub/gallop auscultated. RRR.   Musculoskeletal: Gait antalgic  but steady with use of walker  Neurological: Normal balance/coordination. No tremor.  Skin: warm, dry, intact.   Psychiatric: Normal judgment/insight. Normal mood and affect. Oriented x3.   PMP reviewed: 04/18/2018 filled #15 hydrocodone-acetaminophen 5-325  Visit summary with medication list and pertinent instructions was printed for patient to review, advised to alert [place] if any changes needed. All questions at time of visit were answered - patient instructed to contact office with any additional concerns. ER/RTC precautions were reviewed with the patient and understanding verbalized.    Follow-up plan: Return in about 2 weeks (around 05/10/2018) for recheck on medications .  Note: Total time spent 40 minutes, greater than 50% of the visit was spent face-to-face counseling and coordinating care for the following: The primary encounter diagnosis was Dementia with behavioral disturbance, unspecified dementia type. Diagnoses of Ambulatory dysfunction, Restless leg syndrome, Chronic tension-type headache, not intractable, Gastrointestinal hemorrhage, unspecified gastrointestinal hemorrhage type, Chronic bilateral low back pain without sciatica, Chronic right hip pain, and Polypharmacy were also pertinent to this visit.Marland Kitchen  Please note: voice recognition software was used to produce this document, and typos may escape review. Please contact Dr. Sheppard Coil for any needed clarifications.

## 2018-04-30 ENCOUNTER — Telehealth: Payer: Self-pay

## 2018-04-30 DIAGNOSIS — K922 Gastrointestinal hemorrhage, unspecified: Secondary | ICD-10-CM | POA: Diagnosis not present

## 2018-04-30 NOTE — Telephone Encounter (Signed)
Pt's daughter Gae Bon stopped by office today requesting that a RF for gabapentin.   Last RF sent 03-27-18 and pt has been tapering to 2 capsules QHS  OK top RF?

## 2018-04-30 NOTE — Telephone Encounter (Signed)
That is fine to go ahead and refill

## 2018-05-01 ENCOUNTER — Telehealth: Payer: Self-pay | Admitting: Osteopathic Medicine

## 2018-05-01 LAB — CBC
HCT: 25.9 % — ABNORMAL LOW (ref 35.0–45.0)
Hemoglobin: 8.6 g/dL — ABNORMAL LOW (ref 11.7–15.5)
MCH: 31.3 pg (ref 27.0–33.0)
MCHC: 33.2 g/dL (ref 32.0–36.0)
MCV: 94.2 fL (ref 80.0–100.0)
MPV: 9.5 fL (ref 7.5–12.5)
Platelets: 203 10*3/uL (ref 140–400)
RBC: 2.75 10*6/uL — ABNORMAL LOW (ref 3.80–5.10)
RDW: 15.8 % — ABNORMAL HIGH (ref 11.0–15.0)
WBC: 4.1 10*3/uL (ref 3.8–10.8)

## 2018-05-01 LAB — COMPLETE METABOLIC PANEL WITH GFR
AG RATIO: 1.9 (calc) (ref 1.0–2.5)
ALBUMIN MSPROF: 4.1 g/dL (ref 3.6–5.1)
ALKALINE PHOSPHATASE (APISO): 49 U/L (ref 33–130)
ALT: 20 U/L (ref 6–29)
AST: 30 U/L (ref 10–35)
BILIRUBIN TOTAL: 0.5 mg/dL (ref 0.2–1.2)
BUN / CREAT RATIO: 28 (calc) — AB (ref 6–22)
BUN: 33 mg/dL — AB (ref 7–25)
CHLORIDE: 102 mmol/L (ref 98–110)
CO2: 24 mmol/L (ref 20–32)
Calcium: 9.2 mg/dL (ref 8.6–10.4)
Creat: 1.18 mg/dL — ABNORMAL HIGH (ref 0.60–0.88)
GFR, EST AFRICAN AMERICAN: 48 mL/min/{1.73_m2} — AB (ref >=60)
GFR, Est Non African American: 41 mL/min/{1.73_m2} — ABNORMAL LOW (ref >=60)
GLOBULIN: 2.2 g/dL (ref 1.9–3.7)
Glucose, Bld: 91 mg/dL (ref 65–99)
POTASSIUM: 4.4 mmol/L (ref 3.5–5.3)
SODIUM: 135 mmol/L (ref 135–146)
TOTAL PROTEIN: 6.3 g/dL (ref 6.1–8.1)

## 2018-05-01 LAB — IRON,TIBC AND FERRITIN PANEL
%SAT: 30 % (calc) (ref 16–45)
FERRITIN: 132 ng/mL (ref 16–288)
IRON: 93 ug/dL (ref 45–160)
TIBC: 307 mcg/dL (calc) (ref 250–450)

## 2018-05-01 MED ORDER — GABAPENTIN 300 MG PO CAPS: ORAL_CAPSULE | ORAL | 0 refills | Status: DC

## 2018-05-01 NOTE — Telephone Encounter (Signed)
Bethany Park from [facility] states that none of the numbers she has for this patient is working and they were trying to deliver her wheelchair and Hospital Bed that [place] ordered. Bethany Park with the company's phone is [phone]

## 2018-05-01 NOTE — Telephone Encounter (Signed)
Only other numbers I have for her family is her son-in-law Aviva Signs, her daughter Horton Finer is the number listed in the chart at [phone] (M)

## 2018-05-01 NOTE — Telephone Encounter (Signed)
Contacted Edwena Felty - has been updated and provided with Company's call back information/contact person.

## 2018-05-01 NOTE — Telephone Encounter (Signed)
medication sent topharmacy. KG LPN

## 2018-05-10 ENCOUNTER — Ambulatory Visit (INDEPENDENT_AMBULATORY_CARE_PROVIDER_SITE_OTHER): Payer: Medicare Other | Admitting: Osteopathic Medicine

## 2018-05-10 ENCOUNTER — Encounter: Payer: Self-pay | Admitting: Osteopathic Medicine

## 2018-05-10 VITALS — BP 103/58 | HR 60 | Temp 98.9°F | Wt 145.9 lb

## 2018-05-10 DIAGNOSIS — I2510 Atherosclerotic heart disease of native coronary artery without angina pectoris: Secondary | ICD-10-CM

## 2018-05-10 DIAGNOSIS — F0391 Unspecified dementia with behavioral disturbance: Secondary | ICD-10-CM

## 2018-05-10 DIAGNOSIS — B359 Dermatophytosis, unspecified: Secondary | ICD-10-CM | POA: Diagnosis not present

## 2018-05-10 DIAGNOSIS — G2581 Restless legs syndrome: Secondary | ICD-10-CM | POA: Diagnosis not present

## 2018-05-10 MED ORDER — GABAPENTIN 300 MG PO CAPS: 900.0000 mg | ORAL_CAPSULE | Freq: Every day | ORAL | 0 refills | Status: DC

## 2018-05-10 MED ORDER — NYSTATIN 100000 UNIT/GM EX POWD: Freq: Four times a day (QID) | CUTANEOUS | 1 refills | Status: DC

## 2018-05-10 NOTE — Progress Notes (Signed)
HPI: Bethany Park is a 84 y.o. female who  has a past medical history of Anemia, Aortic stenosis, Atrial fibrillation ([place]), B12 deficiency (03/13/2018), Colon polyp (09/2013), Dementia, Depression with anxiety (03/13/2018), Essential hypertension (03/13/2018), Glucose intolerance (03/13/2018), Heart attack ([place]), Heart failure ([place]), History of COPD, History of coronary artery stent placement (09/2017), Moderate aortic stenosis (03/13/2018), Osteoporosis (03/13/2018), Ovarian cancer ([place]), Restless leg syndrome (03/13/2018), and Stroke ([place]).  she presents to [facility] today, 05/10/18,  for chief complaint of:  Follow-up medications  Last visit c/o persistent frequent headache, Hx migraines. We opted to start Elavil to hopefully help sleep as well as headache ppx.  Patient's daughter reports no more additional complaints of headache  Last visit also opted to refill Norco meds for sparing use.  Okay on use of this medicine may be every few days.  Daughter reports that his leg is greatest problem for her mom.  She has been increasing the dose of the Requip, sometimes as high as 6-8 mg total daily dose (4 mg nightly, occasional extra 2 mg in the middle the night and or in the morning).  Has also been giving an extra gabapentin in the middle of the night if she wakes up  Reports that the hospital bed and wheelchair have been a great help  New rash under abdominal pannus, had been applying cornstarch to this for the past few weeks, she did not think to mention it at her last visit.    Past medical history, surgical history, and family history reviewed.  Current medication list and allergy/intolerance information reviewed.   (See remainder of HPI, ROS, Phys Exam below)    ASSESSMENT/PLAN:   Restless leg syndrome - Advised 4 mg daily maximum of Requip.  Will augment with increased dose gabapentin.  Dementia with behavioral disturbance, unspecified dementia  type  Tinea - Advised put clean dry towel under the area as well to improve air circulation   Meds ordered this encounter  Medications  . nystatin (MYCOSTATIN/NYSTOP) powder    Sig: Apply topically 4 (four) times daily.    Dispense:  60 g    Refill:  1  . DISCONTD: gabapentin (NEURONTIN) 300 MG capsule    Sig: Take 3 capsules (900 mg total) by mouth at bedtime. Take 2 capsules (600 mg total) at bedtime.    Dispense:  90 capsule    Refill:  0  . gabapentin (NEURONTIN) 300 MG capsule    Sig: Take 3 capsules (900 mg total) by mouth at bedtime.    Dispense:  90 capsule    Refill:  0    Patient Instructions  Plan: Requip: try 4 mg (2 pills) at bedtime or 2 mg at bed and 2 mg in the morning  Gabapentin: Try 3 capsules at bedtime, can take an extra in the night if needed  Powder for the rash    Follow-up plan: Return in about 1 month (around 06/07/2018).     ############################################ ############################################ ############################################ ############################################    Outpatient Encounter Medications as of 05/10/2018  Medication Sig  . [facility] bed with rails  . AMBULATORY NON FORMULARY MEDICATION Wheelchair  . amitriptyline (ELAVIL) 25 MG tablet Take 1 tablet (25 mg total) by mouth at bedtime.  Marland Kitchen atorvastatin (LIPITOR) 40 MG tablet Take 1 tablet (40 mg total) by mouth daily.  . carvedilol (COREG) 3.125 MG tablet Take 1 tablet (3.125 mg total) by mouth 2 (two) times daily with a meal.  .  clopidogrel (PLAVIX) 75 MG tablet Take 1 tablet (75 mg total) by mouth daily.  . famotidine (PEPCID) 40 MG tablet Take 1 tablet (40 mg total) by mouth daily.  Marland Kitchen gabapentin (NEURONTIN) 300 MG capsule Take 2 capsules (600 mg total) at bedtime.  Marland Kitchen HYDROcodone-acetaminophen (NORCO) 5-325 MG tablet Take 1 tablet by mouth every 6 (six) hours as needed for severe pain. #45 for 30 days  .  lisinopril (PRINIVIL,ZESTRIL) 2.5 MG tablet Take 1 tablet (2.5 mg total) by mouth daily.  Marland Kitchen LORazepam (ATIVAN) 0.5 MG tablet Take 1 tablet (0.5 mg total) by mouth 2 (two) times daily as needed for anxiety. DO NOT TAKE in evenings w/ Temapzepam  . Melatonin 10 MG SUBL Place 10 mg under the tongue at bedtime.  . nitroGLYCERIN (NITROSTAT) 0.4 MG SL tablet Place 1 tablet (0.4 mg total) under the tongue every 5 (five) minutes as needed for chest pain. If no better 10-15 mins, go to hospital  . rOPINIRole (REQUIP) 2 MG tablet Take 1 tablet (2 mg total) by mouth 2 (two) times daily.   No facility-administered encounter medications on file as of 05/10/2018.    Allergies  Allergen Reactions  . Alendronate Sodium   . Benztropine Mesylate [Benztropine]   . Citalopram   . Cymbalta [Duloxetine Hcl]   . Erythromycin   . Fluoxetine Hcl   . Glimepiride   . Nortriptyline Hcl   . Penicillins   . Sulfa Antibiotics   . Seroquel [Quetiapine Fumarate] Other (See Comments)    As per daughter, hallucinations.      Review of Systems:  Constitutional: No recent illness  HEENT: No  headache, no vision change  Cardiac: No  chest pain, No  pressure, No palpitations  Respiratory:  No  shortness of breath. No  Cough  Gastrointestinal: No  abdominal pain, no change on bowel habits  Musculoskeletal: No new myalgia/arthralgia  Skin: +Rash  Neurologic: No  weakness, No  Dizziness  Exam:  BP (!) 103/58   Pulse 60   Temp 98.9 F (37.2 C) (Oral)   Wt 145 lb 14.4 oz (66.2 kg)   SpO2 97%   BMI 25.04 kg/m   Constitutional: VS see above. General Appearance: alert, well-developed, well-nourished, NAD  Eyes: Normal lids and conjunctive, non-icteric sclera  Ears, Nose, Mouth, Throat: MMM, Normal external inspection ears/nares/mouth/lips/gums.  Neck: No masses, trachea midline.   Respiratory: Normal respiratory effort. no wheeze, no rhonchi, no rales  Cardiovascular: S1/S2 normal, +murmur, no  rub/gallop auscultated. RRR.   Skin: Excoriation and some breakdown under abdominal pannus consistent with tinea/candida infection.   Psychiatric: Normal judgment/insight. Normal mood and affect. Oriented x3.   Visit summary with medication list and pertinent instructions was printed for patient to review, advised to alert [place] if any changes needed. All questions at time of visit were answered - patient instructed to contact office with any additional concerns. ER/RTC precautions were reviewed with the patient and understanding verbalized.   Follow-up plan: Return in about 1 month (around 06/07/2018).  Note: Total time spent 25 minutes, greater than 50% of the visit was spent face-to-face counseling and coordinating care for the following: The primary encounter diagnosis was Restless leg syndrome. Diagnoses of Dementia with behavioral disturbance, unspecified dementia type and Tinea were also pertinent to this visit.Marland Kitchen  Please note: voice recognition software was used to produce this document, and typos may escape review. Please contact Dr. Sheppard Coil for any needed clarifications.

## 2018-05-10 NOTE — Patient Instructions (Signed)
Plan: Requip: try 4 mg (2 pills) at bedtime or 2 mg at bed and 2 mg in the morning  Gabapentin: Try 3 capsules at bedtime, can take an extra in the night if needed  Powder for the rash

## 2018-05-11 ENCOUNTER — Ambulatory Visit (INDEPENDENT_AMBULATORY_CARE_PROVIDER_SITE_OTHER): Payer: Medicare Other

## 2018-05-11 ENCOUNTER — Ambulatory Visit (INDEPENDENT_AMBULATORY_CARE_PROVIDER_SITE_OTHER): Payer: Medicare Other | Admitting: Physician Assistant

## 2018-05-11 ENCOUNTER — Encounter: Payer: Self-pay | Admitting: Physician Assistant

## 2018-05-11 VITALS — BP 99/30 | HR 68 | Ht 64.02 in

## 2018-05-11 DIAGNOSIS — I2510 Atherosclerotic heart disease of native coronary artery without angina pectoris: Secondary | ICD-10-CM

## 2018-05-11 DIAGNOSIS — M810 Age-related osteoporosis without current pathological fracture: Secondary | ICD-10-CM | POA: Diagnosis not present

## 2018-05-11 DIAGNOSIS — F05 Delirium due to known physiological condition: Secondary | ICD-10-CM | POA: Diagnosis not present

## 2018-05-11 DIAGNOSIS — W19XXXA Unspecified fall, initial encounter: Secondary | ICD-10-CM | POA: Diagnosis not present

## 2018-05-11 DIAGNOSIS — F341 Dysthymic disorder: Secondary | ICD-10-CM | POA: Diagnosis not present

## 2018-05-11 DIAGNOSIS — M25551 Pain in right hip: Secondary | ICD-10-CM | POA: Diagnosis not present

## 2018-05-11 DIAGNOSIS — S51811A Laceration without foreign body of right forearm, initial encounter: Secondary | ICD-10-CM

## 2018-05-11 DIAGNOSIS — M47896 Other spondylosis, lumbar region: Secondary | ICD-10-CM | POA: Diagnosis not present

## 2018-05-11 DIAGNOSIS — S79911A Unspecified injury of right hip, initial encounter: Secondary | ICD-10-CM | POA: Diagnosis not present

## 2018-05-11 DIAGNOSIS — I700 Atherosclerosis of aorta: Secondary | ICD-10-CM | POA: Diagnosis not present

## 2018-05-11 DIAGNOSIS — S3992XA Unspecified injury of lower back, initial encounter: Secondary | ICD-10-CM | POA: Diagnosis not present

## 2018-05-11 NOTE — Patient Instructions (Signed)
Fall Prevention in the Home Falls can cause injuries and can affect people from all age groups. There are many simple things that you can do to make your home safe and to help prevent falls. What can I do on the outside of my home?  Regularly repair the edges of walkways and driveways and fix any cracks.  Remove high doorway thresholds.  Trim any shrubbery on the main path into your home.  Use bright outdoor lighting.  Clear walkways of debris and clutter, including tools and rocks.  Regularly check that handrails are securely fastened and in good repair. Both sides of any steps should have handrails.  Install guardrails along the edges of any raised decks or porches.  Have leaves, snow, and ice cleared regularly.  Use sand or salt on walkways during winter months.  In the garage, clean up any spills right away, including grease or oil spills. What can I do in the bathroom?  Use night lights.  Install grab bars by the toilet and in the tub and shower. Do not use towel bars as grab bars.  Use non-skid mats or decals on the floor of the tub or shower.  If you need to sit down while you are in the shower, use a plastic, non-slip stool.  Keep the floor dry. Immediately clean up any water that spills on the floor.  Remove soap buildup in the tub or shower on a regular basis.  Attach bath mats securely with double-sided non-slip rug tape.  Remove throw rugs and other tripping hazards from the floor. What can I do in the bedroom?  Use night lights.  Make sure that a bedside light is easy to reach.  Do not use oversized bedding that drapes onto the floor.  Have a firm chair that has side arms to use for getting dressed.  Remove throw rugs and other tripping hazards from the floor. What can I do in the kitchen?  Clean up any spills right away.  Avoid walking on wet floors.  Place frequently used items in easy-to-reach places.  If you need to reach for something above  you, use a sturdy step stool that has a grab bar.  Keep electrical cables out of the way.  Do not use floor polish or wax that makes floors slippery. If you have to use wax, make sure that it is non-skid floor wax.  Remove throw rugs and other tripping hazards from the floor. What can I do in the stairways?  Do not leave any items on the stairs.  Make sure that there are handrails on both sides of the stairs. Fix handrails that are broken or loose. Make sure that handrails are as long as the stairways.  Check any carpeting to make sure that it is firmly attached to the stairs. Fix any carpet that is loose or worn.  Avoid having throw rugs at the top or bottom of stairways, or secure the rugs with carpet tape to prevent them from moving.  Make sure that you have a light switch at the top of the stairs and the bottom of the stairs. If you do not have them, have them installed. What are some other fall prevention tips?  Wear closed-toe shoes that fit well and support your feet. Wear shoes that have rubber soles or low heels.  When you use a stepladder, make sure that it is completely opened and that the sides are firmly locked. Have someone hold the ladder while you are using  it. Do not climb a closed stepladder.  Add color or contrast paint or tape to grab bars and handrails in your home. Place contrasting color strips on the first and last steps.  Use mobility aids as needed, such as canes, walkers, scooters, and crutches.  Turn on lights if it is dark. Replace any light bulbs that burn out.  Set up furniture so that there are clear paths. Keep the furniture in the same spot.  Fix any uneven floor surfaces.  Choose a carpet design that does not hide the edge of steps of a stairway.  Be aware of any and all pets.  Review your medicines with your healthcare provider. Some medicines can cause dizziness or changes in blood pressure, which increase your risk of falling. Talk with  your health care provider about other ways that you can decrease your risk of falls. This may include working with a physical therapist or trainer to improve your strength, balance, and endurance. This information is not intended to replace advice given to you by your health care provider. Make sure you discuss any questions you have with your health care provider. Document Released: 10/07/2002 Document Revised: 03/15/2016 Document Reviewed: 11/21/2014 Elsevier Interactive Patient Education  [employer].

## 2018-05-11 NOTE — Progress Notes (Signed)
Call pt: no acute changes. No right hip fracture. Possible old left pubic fracture but healing is occurring.   Treatment plan stays the same.   See note for xray of lumbar spine.

## 2018-05-11 NOTE — Progress Notes (Signed)
Call pt: no acute changes on lumbar spine xrays.

## 2018-05-11 NOTE — Progress Notes (Signed)
Subjective:    Patient ID: Bethany Park, female    DOB: [birth year], 84 y.o.   MRN: [id number]  HPI Pt is a 83 yo female with osteoporosis, HTN, A.fibrillation and chronic low back pain who presents to the clinic after a fall early this morning when trying to take her dog out. She is accompanied by daughter who lives with her.  She was walking with walker but dog got caught in her feet.  She landed on her right side. No head injury. She is not on any medication for bone strength. She did cut her right forearm. Her daughter stays with her and was there shortly after she fell. She bandaged her up. She has monitors in her mothers room but does not always hear her get up. She admits her mother "wanders" a lot at night out of her bed doing crafts etc. This is her first fall. Daughter does state she feels like "irritation, confusion" is worsening and more at night.   .. Active Ambulatory Problems    Diagnosis Date Noted  . 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 Diabetic macular edema ([place]) 03/13/2018  . Mild nonproliferative diabetic retinopathy ([place]) 03/13/2018  . Atrial fibrillation ([place]) 03/13/2018  . Essential hypertension 03/13/2018  . History of coronary artery stent placement 03/13/2018  . ASCVD (arteriosclerotic cardiovascular disease) 03/13/2018  . Moderate aortic stenosis 03/13/2018  . Dementia 03/13/2018  . HLD (hyperlipidemia) 03/13/2018  . B12 deficiency 03/13/2018  . Osteoporosis 03/13/2018  . Mild sleep apnea 03/13/2018  . Colon polyp 03/13/2018  . Restless leg syndrome 03/13/2018  . Macular degeneration 03/13/2018  . Anemia 03/13/2018  . Depression with anxiety 03/13/2018  . Overactive bladder 03/13/2018  . History of CVA (cerebrovascular accident) 03/13/2018  . Glucose intolerance 03/13/2018  . Crohn disease ([place]) 03/13/2018  . Generalized abdominal pain 03/28/2018  . 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  . Ambulatory dysfunction 04/03/2018  . UGIB (upper gastrointestinal  bleed) 04/15/2018  . Anemia, blood loss 04/15/2018  . Depression 04/15/2018  . CAD (coronary artery disease) 04/15/2018  . Fall 04/15/2018  . Hyponatremia 04/15/2018  . Acute renal failure superimposed on stage 3 chronic kidney disease ([place]) 04/15/2018  . UTI (urinary tract infection) 04/15/2018  . Hypotension   . Melena   . Chronic low back pain 04/26/2018  . Chronic right hip pain 04/26/2018  . Laceration of right forearm 05/11/2018  . Sundowning 05/11/2018   Resolved Ambulatory Problems    Diagnosis Date Noted  . No Resolved Ambulatory Problems   Past Medical History:  Diagnosis Date  . Anemia   . Aortic stenosis   . Atrial fibrillation ([place])   . B12 deficiency 03/13/2018  . Colon polyp 09/2013  . Dementia   . Depression with anxiety 03/13/2018  . Essential hypertension 03/13/2018  . Glucose intolerance 03/13/2018  . Heart attack ([place])   . Heart failure ([place])   . History of COPD   . History of coronary artery stent placement 09/2017  . Moderate aortic stenosis 03/13/2018  . Osteoporosis 03/13/2018  . Ovarian cancer ([place])   . Restless leg syndrome 03/13/2018  . Stroke [facility])       Review of Systems    see HPI.  Objective:   Physical Exam  Constitutional: She appears well-developed and well-nourished.  In a wheelchair today.   HENT:  Head: Normocephalic and atraumatic.  Cardiovascular: Normal rate and regular rhythm.  Pulmonary/Chest: Effort normal and breath sounds normal.  Musculoskeletal:  Not able to actively move right leg and  hip. Passive ROM is normal. No pain over the bursa of right hip.   Neurological: She is alert.  Not stable on her feet.  Oriented x2. Could not remember the day.   Skin:  Superficial abrasion/laceration to the right forearm approximately 7cm total but only the first 2 cm skin completely torn off.   Bruise of right buttocks and back. Tender to palpation.   Psychiatric: She has a normal mood and affect. Her behavior is normal.           Assessment & Plan:  Marland KitchenMarland KitchenDiagnoses and all orders for this visit:  Fall, initial encounter -     DG HIP UNILAT WITH PELVIS 2-3 VIEWS RIGHT -     DG Lumbar Spine Complete  Sundowning  Dysthymia  Laceration of right forearm, initial encounter   Certainly with osteoporosis concerned for fracture. Pt does do vitamin D and calcium but not interested in bisphosphonate.   No acute fractures seen on xray. Could be an old pubic fracture. Shows signs of healing.   Certainly norco and xanax are fall risk. Does not seem to be involved in this fall and managed by daughter. Discussed staying in a wheelchair most of the time. Only ambulate with walker when accompanied by someone. Pt hesitant but in agreement. Certainly it seems she has some sundowning. Discussed with daughter and patient. Instructed NOT to get out of bed at night. She will keep all of her stuff she likes to do at night in a box beside her bed. I am worried about depression. She seems really down. Discussed things she CAN do. Discussed possible medication to help with mood. Will defer to PcP for further management.cleaned wound with normal saline. Used dermabond on the  top 2cm of laceration topical antibiotic ointment apply and non stick gauze used and wrapped with coban.   Follow up in 2 weeks or sooner if needed.   Marland Kitchen.Spent 30 minutes with patient and greater than 50 percent of visit spent counseling patient regarding treatment plan.

## 2018-05-14 ENCOUNTER — Encounter: Payer: Self-pay | Admitting: Physician Assistant

## 2018-05-16 ENCOUNTER — Telehealth: Payer: Self-pay

## 2018-05-16 NOTE — Telephone Encounter (Signed)
Her daughter called and left a message stating her mom has wheezing and trouble breathing. She wanted some medication sent in. I tried to call. I left a message for a return call. Patient probably needs to be seen.

## 2018-05-18 DIAGNOSIS — F0390 Unspecified dementia without behavioral disturbance: Secondary | ICD-10-CM | POA: Diagnosis not present

## 2018-05-18 DIAGNOSIS — I2510 Atherosclerotic heart disease of native coronary artery without angina pectoris: Secondary | ICD-10-CM | POA: Diagnosis not present

## 2018-05-18 DIAGNOSIS — I481 Persistent atrial fibrillation: Secondary | ICD-10-CM | POA: Diagnosis not present

## 2018-05-18 DIAGNOSIS — J449 Chronic obstructive pulmonary disease, unspecified: Secondary | ICD-10-CM | POA: Diagnosis not present

## 2018-05-18 DIAGNOSIS — E539 Vitamin B deficiency, unspecified: Secondary | ICD-10-CM | POA: Diagnosis not present

## 2018-05-18 DIAGNOSIS — D649 Anemia, unspecified: Secondary | ICD-10-CM | POA: Diagnosis not present

## 2018-05-21 ENCOUNTER — Other Ambulatory Visit: Payer: Self-pay | Admitting: Osteopathic Medicine

## 2018-05-31 ENCOUNTER — Ambulatory Visit: Payer: Medicare Other | Admitting: Osteopathic Medicine

## 2018-06-07 ENCOUNTER — Encounter: Payer: Self-pay | Admitting: Osteopathic Medicine

## 2018-06-07 ENCOUNTER — Ambulatory Visit (INDEPENDENT_AMBULATORY_CARE_PROVIDER_SITE_OTHER): Payer: Medicare Other | Admitting: Osteopathic Medicine

## 2018-06-07 DIAGNOSIS — B359 Dermatophytosis, unspecified: Secondary | ICD-10-CM

## 2018-06-07 DIAGNOSIS — F0391 Unspecified dementia with behavioral disturbance: Secondary | ICD-10-CM | POA: Diagnosis not present

## 2018-06-07 DIAGNOSIS — G2581 Restless legs syndrome: Secondary | ICD-10-CM | POA: Diagnosis not present

## 2018-06-07 DIAGNOSIS — F341 Dysthymic disorder: Secondary | ICD-10-CM | POA: Diagnosis not present

## 2018-06-07 DIAGNOSIS — F05 Delirium due to known physiological condition: Secondary | ICD-10-CM

## 2018-06-07 DIAGNOSIS — G44229 Chronic tension-type headache, not intractable: Secondary | ICD-10-CM | POA: Diagnosis not present

## 2018-06-07 DIAGNOSIS — R262 Difficulty in walking, not elsewhere classified: Secondary | ICD-10-CM

## 2018-06-07 DIAGNOSIS — I2510 Atherosclerotic heart disease of native coronary artery without angina pectoris: Secondary | ICD-10-CM | POA: Diagnosis not present

## 2018-06-07 MED ORDER — ALBUTEROL SULFATE HFA 108 (90 BASE) MCG/ACT IN AERS: 2.0000 | INHALATION_SPRAY | Freq: Four times a day (QID) | RESPIRATORY_TRACT | 11 refills | Status: AC | PRN

## 2018-06-07 MED ORDER — UMECLIDINIUM-VILANTEROL 62.5-25 MCG/INH IN AEPB: 1.0000 | INHALATION_SPRAY | Freq: Every day | RESPIRATORY_TRACT | 5 refills | Status: AC

## 2018-06-07 MED ORDER — GABAPENTIN 300 MG PO CAPS: 900.0000 mg | ORAL_CAPSULE | Freq: Every day | ORAL | 1 refills | Status: DC

## 2018-06-07 MED ORDER — LORAZEPAM 0.5 MG PO TABS: 0.5000 mg | ORAL_TABLET | Freq: Every day | ORAL | 0 refills | Status: DC

## 2018-06-07 NOTE — Progress Notes (Signed)
HPI: Bethany Park is a 84 y.o. female who  has a past medical history of Anemia, Aortic stenosis, Atrial fibrillation ([place]), B12 deficiency (03/13/2018), Colon polyp (09/2013), Dementia, Depression with anxiety (03/13/2018), Essential hypertension (03/13/2018), Glucose intolerance (03/13/2018), Heart attack ([place]), Heart failure ([place]), History of COPD, History of coronary artery stent placement (09/2017), Moderate aortic stenosis (03/13/2018), Osteoporosis (03/13/2018), Ovarian cancer ([place]), Restless leg syndrome (03/13/2018), and Stroke ([place]).  she presents to [facility] today, 06/07/18,  for chief complaint of:  Follow-up medications, needs refills  Previously c/o persistent frequent headache, Hx migraines. We opted to start Elavil to hopefully help sleep as well as headache ppx.  Patient's daughter reports no more additional complaints of headache, elavil taking qhs.   Previous visit also opted to refill Norco meds for sparing use.  Okay on use of this medicine may be every few days.No refills needed now.   Leg pain and sleep are a lot better on current medications.   Reports that the hospital bed and wheelchair have been a great help  Improved rash under abdominal pannus, had been applying cornstarch to this for the past few weeks prior to last visit, now a lot better w/ nystatin powder. They still have one refill.     Past medical history, surgical history, and family history reviewed.  Current medication list and allergy/intolerance information reviewed.   (See remainder of HPI, ROS, Phys Exam below)  BP (!) 158/84 (BP Location: Left Arm, Patient Position: Sitting, Cuff Size: Normal)   Pulse 87   Temp 98.5 F (36.9 C) (Oral)   Wt 144 lb 12.8 oz (65.7 kg)   BMI 24.84 kg/m     ASSESSMENT/PLAN: Diagnoses of Chronic tension-type headache, not intractable, Dementia with behavioral disturbance, unspecified dementia type, Sundowning, Dysthymia, Restless  leg syndrome, Ambulatory dysfunction, and Tinea were pertinent to this visit.     Meds ordered this encounter  Medications  . gabapentin (NEURONTIN) 300 MG capsule    Sig: Take 3 capsules (900 mg total) by mouth at bedtime.    Dispense:  270 capsule    Refill:  1  . LORazepam (ATIVAN) 0.5 MG tablet    Sig: Take 1 tablet (0.5 mg total) by mouth at bedtime.    Dispense:  90 tablet    Refill:  0  . umeclidinium-vilanterol (ANORO ELLIPTA) 62.5-25 MCG/INH AEPB    Sig: Inhale 1 puff into the lungs daily.    Dispense:  60 each    Refill:  5  . albuterol (PROVENTIL HFA;VENTOLIN HFA) 108 (90 Base) MCG/ACT inhaler    Sig: Inhale 2 puffs into the lungs every 6 (six) hours as needed for wheezing.    Dispense:  2 Inhaler    Refill:  11     Follow-up plan: Return in about 3 months (around 09/07/2018) for recheck on medications, sooner if needed .  F/u 2 weeks nurse visit BP check     ############################################ ############################################ ############################################ ############################################    Outpatient Encounter Medications as of 06/07/2018  Medication Sig  . [facility] bed with rails  . AMBULATORY NON FORMULARY MEDICATION Wheelchair  . amitriptyline (ELAVIL) 25 MG tablet Take 1 tablet (25 mg total) by mouth at bedtime.  Marland Kitchen atorvastatin (LIPITOR) 40 MG tablet Take 1 tablet (40 mg total) by mouth daily.  . carvedilol (COREG) 3.125 MG tablet Take 1 tablet (3.125 mg total) by mouth 2 (two) times daily with a meal.  . clopidogrel (PLAVIX) 75 MG  tablet Take 1 tablet (75 mg total) by mouth daily.  . famotidine (PEPCID) 40 MG tablet Take 1 tablet (40 mg total) by mouth daily.  Marland Kitchen gabapentin (NEURONTIN) 300 MG capsule Take 3 capsules (900 mg total) by mouth at bedtime.  Marland Kitchen HYDROcodone-acetaminophen (NORCO) 5-325 MG tablet Take 1 tablet by mouth every 6 (six) hours as needed for severe pain.  #45 for 30 days  . lisinopril (PRINIVIL,ZESTRIL) 2.5 MG tablet Take 1 tablet (2.5 mg total) by mouth daily.  Marland Kitchen LORazepam (ATIVAN) 0.5 MG tablet Take 1 tablet (0.5 mg total) by mouth 2 (two) times daily as needed for anxiety. DO NOT TAKE in evenings w/ Temapzepam  . Melatonin 10 MG SUBL Place 10 mg under the tongue at bedtime.  . nitroGLYCERIN (NITROSTAT) 0.4 MG SL tablet Place 1 tablet (0.4 mg total) under the tongue every 5 (five) minutes as needed for chest pain. If no better 10-15 mins, go to hospital  . nystatin (MYCOSTATIN/NYSTOP) powder Apply topically 4 (four) times daily.  Marland Kitchen rOPINIRole (REQUIP) 2 MG tablet Take 1 tablet (2 mg total) by mouth 2 (two) times daily.   No facility-administered encounter medications on file as of 06/07/2018.    Allergies  Allergen Reactions  . Alendronate Sodium   . Benztropine Mesylate [Benztropine]   . Citalopram   . Cymbalta [Duloxetine Hcl]   . Erythromycin   . Fluoxetine Hcl   . Glimepiride   . Nortriptyline Hcl   . Penicillins   . Sulfa Antibiotics   . Seroquel [Quetiapine Fumarate] Other (See Comments)    As per daughter, hallucinations.      Review of Systems:  Constitutional: No recent illness  HEENT: No  headache, no vision change  Cardiac: No  chest pain, No  pressure, No palpitations  Respiratory:  No  shortness of breath. No  Cough  Gastrointestinal: No  abdominal pain  Musculoskeletal: No new myalgia/arthralgia  Skin: +Rash, better as per HPI  Neurologic: No  weakness, No  Dizziness  Exam:  BP (!) 158/84 (BP Location: Left Arm, Patient Position: Sitting, Cuff Size: Normal)   Pulse 87   Temp 98.5 F (36.9 C) (Oral)   Wt 144 lb 12.8 oz (65.7 kg)   BMI 24.84 kg/m   Constitutional: VS see above. General Appearance: alert, well-developed, well-nourished, NAD  Eyes: Normal lids and conjunctive, non-icteric sclera  Ears, Nose, Mouth, Throat: MMM, Normal external inspection ears/nares/mouth/lips/gums.  Neck: No  masses, trachea midline.   Respiratory: Normal respiratory effort. no wheeze, no rhonchi, no rales  Cardiovascular: S1/S2 normal, +harsh murmur, no rub/gallop auscultated. RRR.   Skin: Excoriation and some breakdown under abdominal pannus consistent with tinea/candida infection.   Psychiatric: Normal judgment/insight. Normal mood and affect. Oriented x3.   Visit summary with medication list and pertinent instructions was printed for patient to review, advised to alert [place] if any changes needed. All questions at time of visit were answered - patient instructed to contact office with any additional concerns. ER/RTC precautions were reviewed with the patient and understanding verbalized.   Follow-up plan: Return in about 3 months (around 09/07/2018) for recheck on medications, sooner if needed .  Note: Total time spent 15 minutes, greater than 50% of the visit was spent face-to-face counseling and coordinating care for the above diagnoses.   Please note: voice recognition software was used to produce this document, and typos may escape review. Please contact Dr. Sheppard Coil for any needed clarifications.

## 2018-06-21 ENCOUNTER — Ambulatory Visit (INDEPENDENT_AMBULATORY_CARE_PROVIDER_SITE_OTHER): Payer: Medicare Other | Admitting: Osteopathic Medicine

## 2018-06-21 VITALS — BP 102/58 | HR 87 | Wt 146.0 lb

## 2018-06-21 DIAGNOSIS — Z8719 Personal history of other diseases of the digestive system: Secondary | ICD-10-CM | POA: Diagnosis not present

## 2018-06-21 DIAGNOSIS — I959 Hypotension, unspecified: Secondary | ICD-10-CM | POA: Diagnosis not present

## 2018-06-21 DIAGNOSIS — I2510 Atherosclerotic heart disease of native coronary artery without angina pectoris: Secondary | ICD-10-CM

## 2018-06-21 LAB — CBC
HEMATOCRIT: 27.9 % — AB (ref 35.0–45.0)
Hemoglobin: 9.2 g/dL — ABNORMAL LOW (ref 11.7–15.5)
MCH: 31.2 pg (ref 27.0–33.0)
MCHC: 33 g/dL (ref 32.0–36.0)
MCV: 94.6 fL (ref 80.0–100.0)
MPV: 10 fL (ref 7.5–12.5)
Platelets: 202 10*3/uL (ref 140–400)
RBC: 2.95 10*6/uL — ABNORMAL LOW (ref 3.80–5.10)
RDW: 13.4 % (ref 11.0–15.0)
WBC: 4.5 10*3/uL (ref 3.8–10.8)

## 2018-06-21 LAB — COMPLETE METABOLIC PANEL WITH GFR
AG RATIO: 1.6 (calc) (ref 1.0–2.5)
ALBUMIN MSPROF: 4 g/dL (ref 3.6–5.1)
ALT: 18 U/L (ref 6–29)
AST: 26 U/L (ref 10–35)
Alkaline phosphatase (APISO): 55 U/L (ref 33–130)
BILIRUBIN TOTAL: 0.4 mg/dL (ref 0.2–1.2)
BUN / CREAT RATIO: 37 (calc) — AB (ref 6–22)
BUN: 45 mg/dL — ABNORMAL HIGH (ref 7–25)
CHLORIDE: 106 mmol/L (ref 98–110)
CO2: 24 mmol/L (ref 20–32)
Calcium: 9.3 mg/dL (ref 8.6–10.4)
Creat: 1.23 mg/dL — ABNORMAL HIGH (ref 0.60–0.88)
GFR, EST AFRICAN AMERICAN: 45 mL/min/{1.73_m2} — AB (ref >=60)
GFR, Est Non African American: 39 mL/min/{1.73_m2} — ABNORMAL LOW (ref >=60)
GLOBULIN: 2.5 g/dL (ref 1.9–3.7)
Glucose, Bld: 133 mg/dL — ABNORMAL HIGH (ref 65–99)
Potassium: 4.9 mmol/L (ref 3.5–5.3)
SODIUM: 137 mmol/L (ref 135–146)
TOTAL PROTEIN: 6.5 g/dL (ref 6.1–8.1)

## 2018-06-21 NOTE — Progress Notes (Signed)
HPI: Bethany Park is a 84 y.o. female who  has a past medical history of Anemia, Aortic stenosis, Atrial fibrillation ([place]), B12 deficiency (03/13/2018), Colon polyp (09/2013), Dementia, Depression with anxiety (03/13/2018), Essential hypertension (03/13/2018), Glucose intolerance (03/13/2018), Heart attack ([place]), Heart failure ([place]), History of COPD, History of coronary artery stent placement (09/2017), Moderate aortic stenosis (03/13/2018), Osteoporosis (03/13/2018), Ovarian cancer ([place]), Restless leg syndrome (03/13/2018), and Stroke ([place]).  she presents to [facility] today, 06/21/18,  for chief complaint of:  BP follow-up  BP was above goal last visit, is hypotensive today x2 on automatic cuff, we rechecked on manual as well and was 102/58 on manual.  Reports an episode of chest pains last night, took 2 Nitro and this got better. Reports fatigue, no chest pain now. No SOB. She and her daughter are going to the craft store after today's visit, pt has been busy making beaded jewelry! She states she feels overall ok.   Taking meds as below.   Last visit w/ Cardiology was 04/18/18, ok to resume plavix after 1 week. No longer on Coumadin d/t GI bleed and fall risk.   Treated for GI bleed earlier 03/2018. Labs rechecked 04/30/2018 and Hgb still low at 8.6 but better than the 4.5 level on 04/15/18 that prompted transfusion.    Patient is accompanied by daughter who assists with history-taking.    Past medical history, surgical history, and family history reviewed.  Current medication list and allergy/intolerance information reviewed.   (See remainder of HPI, ROS, Phys Exam below)  BP (!) 132/52   Pulse 87   Wt 146 lb (66.2 kg)   SpO2 98%   BMI 25.05 kg/m    ASSESSMENT/PLAN: The primary encounter diagnosis was Hypotension, unspecified hypotension type. A diagnosis of History of GI bleed was also pertinent to this visit.   Need to r/o new GI bleed or other  cause of low BP. Will hold ACE in the meantime, continue BB rate control for Afib. CP nothing out of the ordinary for the patient, ER precautions reviewed w/ family. Pt is a NO CODE.    Orders Placed This Encounter  Procedures  . CBC  . COMPLETE METABOLIC PANEL WITH GFR    Patient Instructions  Plan:  BP medications: Hold lisinopril for now Continue carvedilol   Labs: Checking to see if anemia has worsened - will likely need hospitalization if so  Plan to recheck with me in a few days   TO ER if chest pains worsen or aren't helped by Nitro    Follow-up plan: Return in about 4 days (around 06/25/2018) for recheck BP with Dr Sheppard Coil - sooner if needed . (as long as labs ok)               ############################################ ############################################ ############################################ ############################################    Outpatient Encounter Medications as of 06/21/2018  Medication Sig  . albuterol (PROVENTIL HFA;VENTOLIN HFA) 108 (90 Base) MCG/ACT inhaler Inhale 2 puffs into the lungs every 6 (six) hours as needed for wheezing.  . [facility] bed with rails  . AMBULATORY NON FORMULARY MEDICATION Wheelchair  . amitriptyline (ELAVIL) 25 MG tablet Take 1 tablet (25 mg total) by mouth at bedtime.  Marland Kitchen atorvastatin (LIPITOR) 40 MG tablet Take 1 tablet (40 mg total) by mouth daily.  . carvedilol (COREG) 3.125 MG tablet Take 1 tablet (3.125 mg total) by mouth 2 (two) times daily with a meal.  . clopidogrel (PLAVIX) 75 MG tablet  Take 1 tablet (75 mg total) by mouth daily.  . famotidine (PEPCID) 40 MG tablet Take 1 tablet (40 mg total) by mouth daily.  Marland Kitchen gabapentin (NEURONTIN) 300 MG capsule Take 3 capsules (900 mg total) by mouth at bedtime.  Marland Kitchen HYDROcodone-acetaminophen (NORCO) 5-325 MG tablet Take 1 tablet by mouth every 6 (six) hours as needed for severe pain. #45 for 30 days  . lisinopril  (PRINIVIL,ZESTRIL) 2.5 MG tablet Take 1 tablet (2.5 mg total) by mouth daily.  Marland Kitchen LORazepam (ATIVAN) 0.5 MG tablet Take 1 tablet (0.5 mg total) by mouth at bedtime.  . Melatonin 10 MG SUBL Place 10 mg under the tongue at bedtime.  . nitroGLYCERIN (NITROSTAT) 0.4 MG SL tablet Place 1 tablet (0.4 mg total) under the tongue every 5 (five) minutes as needed for chest pain. If no better 10-15 mins, go to hospital  . nystatin (MYCOSTATIN/NYSTOP) powder Apply topically 4 (four) times daily.  Marland Kitchen rOPINIRole (REQUIP) 2 MG tablet Take 1 tablet (2 mg total) by mouth 2 (two) times daily.  Marland Kitchen umeclidinium-vilanterol (ANORO ELLIPTA) 62.5-25 MCG/INH AEPB Inhale 1 puff into the lungs daily.   No facility-administered encounter medications on file as of 06/21/2018.    Allergies  Allergen Reactions  . Alendronate Sodium   . Benztropine Mesylate [Benztropine]   . Citalopram   . Cymbalta [Duloxetine Hcl]   . Erythromycin   . Fluoxetine Hcl   . Glimepiride   . Nortriptyline Hcl   . Penicillins   . Sulfa Antibiotics   . Seroquel [Quetiapine Fumarate] Other (See Comments)    As per daughter, hallucinations.      Review of Systems:  Constitutional: No fever/chills  HEENT: No  headache, no vision change  Cardiac: +chest pain, No  pressure, No palpitations  Respiratory:  No  shortness of breath. No  Cough  Gastrointestinal: No  abdominal pain, no dark/bloody stool  Musculoskeletal: No new myalgia/arthralgia   Exam:  BP (!) 102/58   Pulse 87   Wt 146 lb (66.2 kg)   SpO2 98%   BMI 25.05 kg/m   Constitutional: VS see above. General Appearance: alert, well-developed, well-nourished, NAD  Eyes: Normal lids and conjunctive, non-icteric sclera  Ears, Nose, Mouth, Throat: MMM, Normal external inspection ears/nares/mouth/lips/gums.  Neck: No masses, trachea midline.   Respiratory: Normal respiratory effort. no wheeze, no rhonchi, no rales  Cardiovascular: S1/S2 normal, harsh murmur, no rub/gallop  auscultated. Irreg/Irreg.   Musculoskeletal: Gait normal. Symmetric and independent movement of all extremities  Neurological: Normal balance/coordination. No tremor.  Skin: warm, dry, intact.   Psychiatric: Normal judgment/insight. Normal mood and affect. Oriented x3.   Visit summary with medication list and pertinent instructions was printed for patient to review, advised to alert [place] if any changes needed. All questions at time of visit were answered - patient instructed to contact office with any additional concerns. ER/RTC precautions were reviewed with the patient and understanding verbalized.   Follow-up plan: Return in about 4 days (around 06/25/2018) for recheck BP with Dr Sheppard Coil - sooner if needed .   Please note: voice recognition software was used to produce this document, and typos may escape review. Please contact Dr. Sheppard Coil for any needed clarifications.

## 2018-06-21 NOTE — Patient Instructions (Addendum)
Plan:  BP medications: Hold lisinopril for now Continue carvedilol   Labs: Checking to see if anemia has worsened - will likely need hospitalization if so  Plan to recheck with me in a few days   TO ER if chest pains worsen or aren't helped by [facility]

## 2018-07-10 NOTE — Progress Notes (Signed)
HPI: FU coronary artery disease, aortic stenosis and atrial fibrillation.  Previously resided in [place].  Patient had cardiac catheterization December 2018.  She had mid LAD disease and ostial first diagonal disease.  She had PCI to mid LAD and PCI to the bifurcation of mid LAD and diagonal with 2 drug-eluting stents.  Patient was placed on dual antiplatelet therapy.  She was also on Coumadin for history of atrial fibrillation. Subsequently had GI bleed.  Coumadin and Plavix were discontinued. Per GI recommendations they should be held for 1 week and then one could be resumed.  Hemoglobin improved to 8.7 at discharge. Also note patient had EGD showed 3 angiodysplastic lesions in the cardia of the stomach treated are argon plasma coagulation.  Also noted to have aortic stenosis murmur. We decided not to pursue echos as pt has dementia and is a NCB. Daughter wanted only conservative measures. Pt felt not to be a candidate to resume coumadin at last ov. Since last seen, patient denies dyspnea.  Occasional chest pain.  No syncope.  Current Outpatient Medications  Medication Sig Dispense Refill  . albuterol (PROVENTIL HFA;VENTOLIN HFA) 108 (90 Base) MCG/ACT inhaler Inhale 2 puffs into the lungs every 6 (six) hours as needed for wheezing. (Patient not taking: Reported on 07/11/2018) 2 Inhaler 11  . [facility] bed with rails (Patient not taking: Reported on 07/11/2018) 1 Units prn  . AMBULATORY NON FORMULARY MEDICATION Wheelchair (Patient not taking: Reported on 07/11/2018) 1 Units prn  . amitriptyline (ELAVIL) 25 MG tablet Take 1 tablet (25 mg total) by mouth at bedtime. (Patient not taking: Reported on 07/11/2018) 90 tablet 0  . atorvastatin (LIPITOR) 40 MG tablet Take 1 tablet (40 mg total) by mouth daily. (Patient not taking: Reported on 07/11/2018) 90 tablet 3  . carvedilol (COREG) 3.125 MG tablet Take 1 tablet (3.125 mg total) by mouth 2 (two) times daily with a  meal. (Patient not taking: Reported on 07/11/2018) 180 tablet 3  . clopidogrel (PLAVIX) 75 MG tablet Take 1 tablet (75 mg total) by mouth daily. (Patient not taking: Reported on 07/11/2018) 90 tablet 3  . famotidine (PEPCID) 40 MG tablet Take 1 tablet (40 mg total) by mouth daily. (Patient not taking: Reported on 07/11/2018) 30 tablet 0  . gabapentin (NEURONTIN) 300 MG capsule Take 3 capsules (900 mg total) by mouth at bedtime. (Patient not taking: Reported on 07/11/2018) 270 capsule 1  . HYDROcodone-acetaminophen (NORCO) 5-325 MG tablet Take 1 tablet by mouth every 6 (six) hours as needed for severe pain. #45 for 30 days (Patient not taking: Reported on 07/11/2018) 45 tablet 0  . lisinopril (PRINIVIL,ZESTRIL) 2.5 MG tablet Take 1 tablet (2.5 mg total) by mouth daily. (Patient not taking: Reported on 07/11/2018) 90 tablet 3  . LORazepam (ATIVAN) 0.5 MG tablet Take 1 tablet (0.5 mg total) by mouth at bedtime. (Patient not taking: Reported on 07/11/2018) 90 tablet 0  . Melatonin 10 MG SUBL Place 10 mg under the tongue at bedtime. (Patient not taking: Reported on 07/11/2018) 90 tablet 3  . nitroGLYCERIN (NITROSTAT) 0.4 MG SL tablet Place 1 tablet (0.4 mg total) under the tongue every 5 (five) minutes as needed for chest pain. If no better 10-15 mins, go to hospital (Patient not taking: Reported on 07/11/2018) 20 tablet 1  . nystatin (MYCOSTATIN/NYSTOP) powder Apply topically 4 (four) times daily. (Patient not taking: Reported on 07/11/2018) 60 g 1  . rOPINIRole (REQUIP) 2 MG tablet Take 1  tablet (2 mg total) by mouth 2 (two) times daily. (Patient not taking: Reported on 07/11/2018) 180 tablet 3  . umeclidinium-vilanterol (ANORO ELLIPTA) 62.5-25 MCG/INH AEPB Inhale 1 puff into the lungs daily. (Patient not taking: Reported on 07/11/2018) 60 each 5   No current facility-administered medications for this visit.      Past Medical History:  Diagnosis Date  . Anemia   . Aortic stenosis   . Atrial fibrillation ([place])    . B12 deficiency 03/13/2018  . Colon polyp 09/2013   in [place] state.  for personal hx polyps and fm hx colon ca.  found small transverse polyp, diverticulosis, int hemorrhoids.    . Dementia   . Depression with anxiety 03/13/2018  . Essential hypertension 03/13/2018  . Glucose intolerance 03/13/2018  . Heart attack ([place])   . Heart failure ([place])   . History of COPD   . History of coronary artery stent placement 09/2017  . Moderate aortic stenosis 03/13/2018  . Osteoporosis 03/13/2018  . Ovarian cancer ([place])   . Restless leg syndrome 03/13/2018  . Stroke [facility])     Past Surgical History:  Procedure Laterality Date  . ABDOMINAL HYSTERECTOMY    . BACK SURGERY    . BREAST SURGERY    . CHOLECYSTECTOMY    . CORONARY STENT PLACEMENT    . ESOPHAGOGASTRODUODENOSCOPY N/A 04/16/2018   Procedure: ESOPHAGOGASTRODUODENOSCOPY (EGD);  Surgeon: Ladene Artist, MD;  Location: [facility] ENDOSCOPY;  Service: Endoscopy;  Laterality: N/A;  . EYE SURGERY    . HEMORROIDECTOMY    . HOT HEMOSTASIS N/A 04/16/2018   Procedure: HOT HEMOSTASIS (ARGON PLASMA COAGULATION/BICAP);  Surgeon: Ladene Artist, MD;  Location: [facility] ENDOSCOPY;  Service: Endoscopy;  Laterality: N/A;  . JOINT REPLACEMENT      Social History   Socioeconomic History  . Marital status: Widowed    Spouse name: Not on file  . Number of children: 8  . Years of education: Not on file  . Highest education level: Not on file  Occupational History  . Not on file  Social Needs  . Financial resource strain: Not on file  . Food insecurity:    Worry: Not on file    Inability: Not on file  . Transportation needs:    Medical: Not on file    Non-medical: Not on file  Tobacco Use  . Smoking status: Never Smoker  . Smokeless tobacco: Never Used  Substance and Sexual Activity  . Alcohol use: Not Currently  . Drug use: Not Currently  . Sexual activity: Not Currently  Lifestyle  . Physical activity:    Days per week: Not on file    Minutes per  session: Not on file  . Stress: Not on file  Relationships  . Social connections:    Talks on phone: Not on file    Gets together: Not on file    Attends religious service: Not on file    Active member of club or organization: Not on file    Attends meetings of clubs or organizations: Not on file    Relationship status: Not on file  . Intimate partner violence:    Fear of current or ex partner: Not on file    Emotionally abused: Not on file    Physically abused: Not on file    Forced sexual activity: Not on file  Other Topics Concern  . Not on file  Social History Narrative  . Not on file    Family History  Problem  Relation Age of Onset  . Heart disease Mother   . Diabetes Mellitus II Mother   . Stroke Mother   . Colon cancer Father     ROS: Restless leg but no fevers or chills, productive cough, hemoptysis, dysphasia, odynophagia, melena, hematochezia, dysuria, hematuria, rash, seizure activity, orthopnea, PND, pedal edema, claudication. Remaining systems are negative.  Physical Exam: Well-developed well-nourished in no acute distress.  Skin is warm and dry.  HEENT is normal.  Neck is supple.  Chest is clear to auscultation with normal expansion.  Cardiovascular exam is regular rate and rhythm.  3/6 systolic murmur left sternal border radiating to carotids. Abdominal exam nontender or distended. No masses palpated. Extremities show no edema. neuro grossly intact  A/P  1 coronary artery disease-patient is not taking any of her medications.  She takes these only intermittently and her daughter states she wants to have a heart attack and die.  I explained that not taking aspirin and Plavix because his risk of stent thrombosis.  2 paroxysmal atrial fibrillation-patient is in sinus rhythm on examination today.  She is not taking her beta-blocker.  I have felt she is not a candidate for Coumadin as she has frequent falls and dementia.  She also had a previous GI bleed.  She  also is intermittently noncompliant with medications.  I discussed this with her daughter previously and she was in agreement.  3 aortic stenosis-as outlined in previous note patient has significant aortic stenosis on examination.  However she is 84 years old, and no CODE BLUE and has dementia.  She also does not want aggressive interventions.  Daughter does not want any aggressive measures including consideration of TAVR.  We have therefore elected not to pursue follow-up echocardiograms.  4 hypertension-blood pressure is elevated.  However she is not taking medications.  I have asked her to resume.  5 dementia-Per primary care.  6 no CODE BLUE  At this point patient is not interested in procedures and does not want medications.  I have asked her to contact us if we can be of assistance in the future.  Kirk Ruths, MD

## 2018-07-11 ENCOUNTER — Ambulatory Visit (INDEPENDENT_AMBULATORY_CARE_PROVIDER_SITE_OTHER): Payer: Medicare Other | Admitting: Cardiology

## 2018-07-11 ENCOUNTER — Encounter: Payer: Self-pay | Admitting: Cardiology

## 2018-07-11 VITALS — BP 168/88 | HR 100 | Ht 64.02 in | Wt 141.4 lb

## 2018-07-11 DIAGNOSIS — I350 Nonrheumatic aortic (valve) stenosis: Secondary | ICD-10-CM

## 2018-07-11 DIAGNOSIS — I10 Essential (primary) hypertension: Secondary | ICD-10-CM | POA: Diagnosis not present

## 2018-07-11 DIAGNOSIS — I2510 Atherosclerotic heart disease of native coronary artery without angina pectoris: Secondary | ICD-10-CM | POA: Diagnosis not present

## 2018-07-11 DIAGNOSIS — I480 Paroxysmal atrial fibrillation: Secondary | ICD-10-CM | POA: Diagnosis not present

## 2018-07-11 NOTE — Patient Instructions (Signed)
Medication Instructions: Your physician recommends that you continue on your current medications as directed.    If you need a refill on your cardiac medications before your next appointment, please call your pharmacy.   Labwork: None  Procedures/Testing: None  Follow-Up: Your physician wants you to follow-up as needed with Dr. Stanford Breed. You will receive a reminder letter in the mail two months in advance. Call our office at [phone] to schedule this follow-up appointment if needed.   Special Instructions:    Thank you for choosing [facility] at [facility]!!

## 2018-07-12 ENCOUNTER — Telehealth: Payer: Self-pay | Admitting: Osteopathic Medicine

## 2018-07-12 NOTE — Telephone Encounter (Signed)
Please call patient's daughter, Gae Bon.  I got the note she left at the front office about her mom.  I think we can schedule some time to talk about this over the phone on my administration time tomorrow, which should be available for me to call her sometime between noon and 1:30?

## 2018-07-12 NOTE — Telephone Encounter (Signed)
Left a detailed vm msg for pt's daughter Gae Bon regarding provider's note. Call back information provided.

## 2018-07-17 ENCOUNTER — Encounter: Payer: Self-pay | Admitting: Osteopathic Medicine

## 2018-07-17 ENCOUNTER — Ambulatory Visit (INDEPENDENT_AMBULATORY_CARE_PROVIDER_SITE_OTHER): Payer: Medicare Other | Admitting: Osteopathic Medicine

## 2018-07-17 VITALS — BP 130/61 | HR 82 | Temp 98.3°F | Wt 136.8 lb

## 2018-07-17 DIAGNOSIS — I2510 Atherosclerotic heart disease of native coronary artery without angina pectoris: Secondary | ICD-10-CM

## 2018-07-17 DIAGNOSIS — G2581 Restless legs syndrome: Secondary | ICD-10-CM | POA: Diagnosis not present

## 2018-07-17 DIAGNOSIS — K219 Gastro-esophageal reflux disease without esophagitis: Secondary | ICD-10-CM

## 2018-07-17 MED ORDER — ROPINIROLE HCL 3 MG PO TABS: 3.0000 mg | ORAL_TABLET | Freq: Three times a day (TID) | ORAL | 1 refills | Status: DC

## 2018-07-17 NOTE — Progress Notes (Signed)
HPI: Bethany Park is a 84 y.o. female who  has a past medical history of Anemia, Aortic stenosis, Atrial fibrillation ([place]), B12 deficiency (03/13/2018), Colon polyp (09/2013), Dementia, Depression with anxiety (03/13/2018), Essential hypertension (03/13/2018), Glucose intolerance (03/13/2018), Heart attack ([place]), Heart failure ([place]), History of COPD, History of coronary artery stent placement (09/2017), Moderate aortic stenosis (03/13/2018), Osteoporosis (03/13/2018), Ovarian cancer ([place]), Restless leg syndrome (03/13/2018), and Stroke ([place]).  she presents to [facility] today, 07/17/18,  for chief complaint of:  Medication concern, RLS  Patient and her daughter presents to clinic today for follow-up mostly restless leg syndrome.  Daughter has some concerns that Ms. Bethany Park has refused to take medications, stating that "I just want to go" because restless legs are so bad.  She would like to be put back on the Requip 3 mg 3 times daily, which she states her previous neurologist had her taking and which helped.  Patient has history of dementia, occasionally she is perfectly lucid, sometimes more confused.  Daughter thinks that she has a good understanding of consequences of not taking her other medications, including risk of worsening cardiac disease and death.    Past medical history, surgical history, and family history reviewed.  Current medication list and allergy/intolerance information reviewed.   (See remainder of HPI, ROS, Phys Exam below)    ASSESSMENT/PLAN:   Restless leg syndrome - Will go ahead and increase dose of Requip  Gastroesophageal reflux disease, esophagitis presence not specified - Patient is stopped taking antacids, was advised to restart these, especially with history of GI bleed.    I had a long conversation with the patient as well as privately with her daughter regarding patient's goals for quality of life, and her understanding of  consequences of not taking medications.    While the patient probably does not have full capacity to make really complex medical decisions, I think that she can make a decision for herself whether taking a bunch of pills every day aligns with her goals for care.  She states that she is overall feeling better off of most of the medicines.  She is still taking the gabapentin and the Requip.  She recognizes that she risks cardiac worsening and death by not adhering to medication.   I reiterated to the patient that I certainly think taking medicines is more helpful for her than risky, but I understand that she can make her own decision in this regard I think she has an understanding of the consequences of not doing so.  I also expressed concern that we can certainly treat restless leg syndrome, this is not a terminal condition if we can get her quality of life a bit better with RLS, please she would be open to taking the other medications as well, patient states that she will consider it.  At this point, it sounds like the daughter is having a really hard time getting her to take medications, she is certainly lucid enough to be combative and experienced significant agitation forced to take medications and I do not think it is worth it to argue with her   Meds ordered this encounter  Medications  . rOPINIRole (REQUIP) 3 MG tablet    Sig: Take 1 tablet (3 mg total) by mouth 3 (three) times daily.    Dispense:  90 tablet    Refill:  1      Follow-up plan: Return in about 4 weeks (around 08/14/2018) for recheck RLS - see me  sooner if needed .                          ############################################ ############################################ ############################################ ############################################    Outpatient Encounter Medications as of 07/17/2018  Medication Sig Note  . gabapentin (NEURONTIN) 300 MG capsule Take 3 capsules  (900 mg total) by mouth at bedtime. 07/17/2018: Per pt's daughter - PRN  . Melatonin 10 MG SUBL Place 10 mg under the tongue at bedtime.   Marland Kitchen rOPINIRole (REQUIP) 3 MG tablet Take 1 tablet (3 mg total) by mouth 3 (three) times daily.   . [DISCONTINUED] rOPINIRole (REQUIP) 2 MG tablet Take 1 tablet (2 mg total) by mouth 2 (two) times daily.   Marland Kitchen albuterol (PROVENTIL HFA;VENTOLIN HFA) 108 (90 Base) MCG/ACT inhaler Inhale 2 puffs into the lungs every 6 (six) hours as needed for wheezing. (Patient not taking: Reported on 07/17/2018)   .  Hospital bed with rails (Patient not taking: Reported on 07/17/2018)   . AMBULATORY NON FORMULARY MEDICATION Wheelchair (Patient not taking: Reported on 07/17/2018)   . amitriptyline (ELAVIL) 25 MG tablet Take 1 tablet (25 mg total) by mouth at bedtime. (Patient not taking: Reported on 07/17/2018)   . atorvastatin (LIPITOR) 40 MG tablet Take 1 tablet (40 mg total) by mouth daily. (Patient not taking: Reported on 07/17/2018)   . carvedilol (COREG) 3.125 MG tablet Take 1 tablet (3.125 mg total) by mouth 2 (two) times daily with a meal. (Patient not taking: Reported on 07/17/2018)   . clopidogrel (PLAVIX) 75 MG tablet Take 1 tablet (75 mg total) by mouth daily. (Patient not taking: Reported on 07/17/2018)   . famotidine (PEPCID) 40 MG tablet Take 1 tablet (40 mg total) by mouth daily. (Patient not taking: Reported on 07/17/2018)   . HYDROcodone-acetaminophen (NORCO) 5-325 MG tablet Take 1 tablet by mouth every 6 (six) hours as needed for severe pain. #45 for 30 days (Patient not taking: Reported on 07/17/2018)   . lisinopril (PRINIVIL,ZESTRIL) 2.5 MG tablet Take 1 tablet (2.5 mg total) by mouth daily. (Patient not taking: Reported on 07/17/2018)   . LORazepam (ATIVAN) 0.5 MG tablet Take 1 tablet (0.5 mg total) by mouth at bedtime. (Patient not taking: Reported on 07/17/2018)   . nitroGLYCERIN (NITROSTAT) 0.4 MG SL tablet Place 1 tablet (0.4 mg total)  under the tongue every 5 (five) minutes as needed for chest pain. If no better 10-15 mins, go to hospital (Patient not taking: Reported on 07/17/2018)   . nystatin (MYCOSTATIN/NYSTOP) powder Apply topically 4 (four) times daily. (Patient not taking: Reported on 07/17/2018)   . umeclidinium-vilanterol (ANORO ELLIPTA) 62.5-25 MCG/INH AEPB Inhale 1 puff into the lungs daily. (Patient not taking: Reported on 07/17/2018)    No facility-administered encounter medications on file as of 07/17/2018.    Allergies  Allergen Reactions  . Alendronate Sodium   . Benztropine Mesylate [Benztropine]   . Citalopram   . Cymbalta [Duloxetine Hcl]   . Erythromycin   . Fluoxetine Hcl   . Glimepiride   . Nortriptyline Hcl   . Penicillins   . Sulfa Antibiotics   . Seroquel [Quetiapine Fumarate] Other (See Comments)    As per daughter, hallucinations.      Review of Systems:  Constitutional: No recent illness  Cardiac: No  chest pain, No  pressure, No palpitations  Respiratory:  No  shortness of breath. No  Cough  Gastrointestinal: +abdominal pain GERD  Musculoskeletal: No new myalgia/arthralgia  Neurologic:  No  weakness, No  Dizziness   Exam:  BP 130/61 (BP Location: Left Arm, Patient Position: Sitting, Cuff Size: Normal)   Pulse 82   Temp 98.3 F (36.8 C) (Oral)   Wt 136 lb 12.8 oz (62.1 kg)   BMI 23.47 kg/m   Constitutional: VS see above. General Appearance: alert, well-developed, well-nourished, NAD  Eyes: Normal lids and conjunctive, non-icteric sclera  Ears, Nose, Mouth, Throat: MMM, Normal external inspection ears/nares/mouth/lips/gums.  Neck: No masses, trachea midline.   Respiratory: Normal respiratory effort. no wheeze, no rhonchi, no rales  Cardiovascular: S1/S2 normal, +harsh murmur, no rub/gallop auscultated. RRR.   Musculoskeletal: Gait normal.   Psychiatric: Fair to Normal judgment/insight. Normal mood and affect.   Visit summary with medication list and pertinent  instructions was printed for patient to review, advised to alert [place] if any changes needed. All questions at time of visit were answered - patient instructed to contact office with any additional concerns. ER/RTC precautions were reviewed with the patient and understanding verbalized.   Follow-up plan: Return in about 4 weeks (around 08/14/2018) for recheck RLS - see me sooner if needed .  Note: Total time spent 25 minutes, greater than 50% of the visit was spent face-to-face counseling and coordinating care for the following: The primary encounter diagnosis was Restless leg syndrome. A diagnosis of Gastroesophageal reflux disease, esophagitis presence not specified was also pertinent to this visit.Marland Kitchen  Please note: voice recognition software was used to produce this document, and typos may escape review. Please contact Dr. Sheppard Coil for any needed clarifications.

## 2018-09-17 ENCOUNTER — Ambulatory Visit (INDEPENDENT_AMBULATORY_CARE_PROVIDER_SITE_OTHER): Payer: Medicare Other | Admitting: Osteopathic Medicine

## 2018-09-17 ENCOUNTER — Encounter: Payer: Self-pay | Admitting: Osteopathic Medicine

## 2018-09-17 ENCOUNTER — Ambulatory Visit (INDEPENDENT_AMBULATORY_CARE_PROVIDER_SITE_OTHER): Payer: Medicare Other

## 2018-09-17 VITALS — BP 136/83 | HR 87 | Temp 97.9°F | Wt 134.2 lb

## 2018-09-17 DIAGNOSIS — K5901 Slow transit constipation: Secondary | ICD-10-CM

## 2018-09-17 DIAGNOSIS — K5904 Chronic idiopathic constipation: Secondary | ICD-10-CM

## 2018-09-17 DIAGNOSIS — R195 Other fecal abnormalities: Secondary | ICD-10-CM | POA: Diagnosis not present

## 2018-09-17 DIAGNOSIS — I2510 Atherosclerotic heart disease of native coronary artery without angina pectoris: Secondary | ICD-10-CM | POA: Diagnosis not present

## 2018-09-17 DIAGNOSIS — Z23 Encounter for immunization: Secondary | ICD-10-CM | POA: Diagnosis not present

## 2018-09-17 MED ORDER — BISACODYL-PEG-KCL-NABICAR-NACL 5-210 MG-GM PO KIT: PACK | ORAL | 0 refills | Status: DC

## 2018-09-17 MED ORDER — LUBIPROSTONE 8 MCG PO CAPS: 8.0000 ug | ORAL_CAPSULE | Freq: Two times a day (BID) | ORAL | 1 refills | Status: DC

## 2018-09-17 MED ORDER — BISACODYL 10 MG RE SUPP: 10.0000 mg | Freq: Once | RECTAL | 0 refills | Status: AC

## 2018-09-17 NOTE — Patient Instructions (Addendum)
CONSTIPATION OVERVIEW - Constipation refers to a change in bowel habits, but it has varied meanings. Stools may be too hard or too small, difficult to pass, or infrequent (less than three times per week). People with constipation may also notice a frequent need to strain and a sense that the bowels are not empty.  Constipation is a very common problem. Each year more than 2.5 million Americans visit their healthcare provider for relief from this problem. Many factors can contribute to or cause constipation, although in most people, no single cause can be found. In general, constipation occurs more frequently as you get older.    CONSTIPATION TREATMENT - Treatment for constipation includes changing some behaviors, eating foods high in fiber, and using laxatives or enemas if needed.  You can try these treatments at home, before seeing a healthcare provider. However, if you do not have a bowel movement within a few days, you should call your healthcare provider for further assistance.   Behavior changes - The bowels are most active following meals, and this is often the time when stools will pass most readily. If you ignore your body's signals to have a bowel movement, the signals become weaker and weaker over time. By paying close attention to these signals, you may have an easier time moving your bowels. Drinking a caffeine-containing beverage in the morning may also be helpful.  Increase fiber - Increasing fiber in your diet may reduce or eliminate constipation. The recommended amount of dietary fiber is 20 to 35 grams of fiber per day. By reading the product information panel on the side of the package, you can determine the number of grams of fiber per serving Many fruits and vegetables can be particularly helpful in preventing and treating constipation. This is especially true of citrus fruits, prunes, and prune juice. Some breakfast cereals are also an excellent source of dietary fiber. Consuming  large amounts of fiber can cause abdominal bloating or gas; this can be minimized by starting with a small amount and slowly increasing until stools become softer and more frequent.   LAXATIVES - If behavior changes and increasing fiber does not relieve your constipation, you may try taking a laxative. A variety of laxatives are available for treating constipation. The choice between them is based upon how they work, how safe the treatment is, and your healthcare provider's preferences. In general, laxatives can be categorized into the following groups:  Bulk forming laxatives - These include natural fiber and commercial fiber preparations such as: ?Psyllium (Konsyl; Metamucil; Perdiem) ?Methylcellulose (Citrucel) ?Calcium polycarbophil (FiberCon; Fiber-Lax; Mitrolan) ?Wheat dextrin (Benefiber) You should increase the dose of fiber supplements slowly to prevent gas and cramping, and you should always take the supplement with plenty of fluid.  Hyperosmolar laxatives - Hyperosmolar laxatives include: ?Polyethylene glycol (MiraLax, Glycolax) ?Lactulose ?Sorbitol Polyethylene glycol is generally preferred since it does not cause gas or bloating and is available in the [place] without a prescription. Lactulose and sorbitol can produce gas and bloating.   Saline laxatives - Saline laxatives such as magnesium hydroxide (Milk of Magnesia) and magnesium citrate (Evac-Q-Mag) act similarly to the hyperosmolar laxatives.  Stimulant laxatives - Stimulant laxatives include senna (eg, Black Draught, Ex-lax, Fletcher's, [place], Senokot) and bisacodyl (eg, Correctol, Doxidan, Dulcolax). Some people overuse stimulant laxatives. Taking stimulant laxatives regularly or in large amounts can cause side effects, including low potassium levels. Thus, you should take these drugs carefully if you must use them regularly.   New treatments - Lubiprostone (Amitiza) is  a prescription medication that treats severe  constipation. It is expensive, but it may be recommended if you do not respond to other treatments. Linaclotide (Linzess) is another prescription medication that has been approved for the treatment of chronic constipation. It is also an expensive medication as compared with other agents with the exception of lubiprostone. Linaclotide may be recommended if you do not respond to other treatments.  Pills, suppositories, or enemas? - Laxatives are available as pills that you take by mouth or as suppositories or enemas that you insert into the rectum. In general, suppositories and enemas work more quickly compared to pills, but many people do not like using them.   Constipation treatments to avoid ?Emollients - Emollient laxatives, principally mineral oil, soften stools by moisturizing them. However, other treatments have fewer risks and equal benefit. ?Natural products - A wide variety of natural products are advertised for constipation. Some of them contain the active ingredients found in commercially available laxatives. However, their dose and purity may not be carefully controlled. Thus, these products are not generally recommended.  A variety of home-made enema preparations have been used throughout the years, such as soapsuds, hydrogen peroxide, and household detergents. These can be extremely irritating to the lining of the intestine and should be avoided.

## 2018-09-17 NOTE — Progress Notes (Signed)
HPI: Bethany Park is a 84 y.o. female who  has a past medical history of Anemia, Aortic stenosis, Atrial fibrillation ([place]), B12 deficiency (03/13/2018), Colon polyp (09/2013), Dementia, Depression with anxiety (03/13/2018), Essential hypertension (03/13/2018), Glucose intolerance (03/13/2018), Heart attack ([place]), Heart failure ([place]), History of COPD, History of coronary artery stent placement (09/2017), Moderate aortic stenosis (03/13/2018), Osteoporosis (03/13/2018), Ovarian cancer ([place]), Restless leg syndrome (03/13/2018), and Stroke ([place]).  she presents to [facility] today, 09/17/18,  for chief complaint of:  Constipation   Daughter reports >1 week since last BM MiraLax at home not effective - she uses 17 g daily, every day Constipation has been going on "forever" Minimal exercise No change in diet/ydration  No abdominal pain Belching but no N/V         At today's visit... Past medical history, surgical history, and family history reviewed and updated as needed.  Current medication list and allergy/intolerance information reviewed and updated as needed. (See remainder of HPI, ROS, Phys Exam below)   Images personally reviewed - no obvious obstruction Radiology over-read as below   Dg Abd 2 Views  Result Date: 09/17/2018 CLINICAL DATA:  Slow transit constipation EXAM: ABDOMEN - 2 VIEW COMPARISON:  CT abdomen pelvis 04/15/2018 FINDINGS: Moderate stool throughout the colon including right left and rectum. Negative for bowel obstruction. No free air. Negative for urinary tract calculi. No acute skeletal abnormality. Moderate lumbar degenerative change. IMPRESSION: Moderate stool in the colon. Negative for bowel obstruction or free air. Electronically Signed   By: Franchot Gallo M.D.   On: 09/17/2018 11:15           ASSESSMENT/PLAN: The primary encounter diagnosis was Slow transit constipation. Diagnoses of Chronic idiopathic constipation and  Need for influenza vaccination were also pertinent to this visit.   Will trial bowel prep   Strongly consider starting Amitiza for long term treatment   Pt declined DRE today  Precautions reviewed: severe pain or nausea/vomiting --> to ER   Orders Placed This Encounter  Procedures  . DG Abd 2 Views  . Flu vaccine HIGH DOSE PF (Fluzone High dose)     Meds ordered this encounter  Medications  . bisacodyl (DULCOLAX) 10 MG suppository    Sig: Place 1 suppository (10 mg total) rectally once for 1 dose.    Dispense:  4 suppository    Refill:  0  . lubiprostone (AMITIZA) 8 MCG capsule    Sig: Take 1 capsule (8 mcg total) by mouth 2 (two) times daily with a meal. Start day after next bowel movement    Dispense:  60 capsule    Refill:  1  . Bisacodyl-PEG-KCl-NaBicar-NaCl (PEG-PREP) 5-210 MG-GM kit    Sig: Start 6 hours after bisacodyl suppository. Take 8 oz every 10 minutes until 2 L are consumed (can try 1 L if unable to tolerate 2 L)    Dispense:  1 each    Refill:  0    Patient Instructions  CONSTIPATION OVERVIEW - Constipation refers to a change in bowel habits, but it has varied meanings. Stools may be too hard or too small, difficult to pass, or infrequent (less than three times per week). People with constipation may also notice a frequent need to strain and a sense that the bowels are not empty.  Constipation is a very common problem. Each year more than 2.5 million Americans visit their healthcare provider for relief from this problem. Many factors can contribute to or cause constipation, although  in most people, no single cause can be found. In general, constipation occurs more frequently as you get older.    CONSTIPATION TREATMENT - Treatment for constipation includes changing some behaviors, eating foods high in fiber, and using laxatives or enemas if needed.  You can try these treatments at home, before seeing a healthcare provider. However, if you do not have a bowel  movement within a few days, you should call your healthcare provider for further assistance.   Behavior changes - The bowels are most active following meals, and this is often the time when stools will pass most readily. If you ignore your body's signals to have a bowel movement, the signals become weaker and weaker over time. By paying close attention to these signals, you may have an easier time moving your bowels. Drinking a caffeine-containing beverage in the morning may also be helpful.  Increase fiber - Increasing fiber in your diet may reduce or eliminate constipation. The recommended amount of dietary fiber is 20 to 35 grams of fiber per day. By reading the product information panel on the side of the package, you can determine the number of grams of fiber per serving Many fruits and vegetables can be particularly helpful in preventing and treating constipation. This is especially true of citrus fruits, prunes, and prune juice. Some breakfast cereals are also an excellent source of dietary fiber. Consuming large amounts of fiber can cause abdominal bloating or gas; this can be minimized by starting with a small amount and slowly increasing until stools become softer and more frequent.   LAXATIVES - If behavior changes and increasing fiber does not relieve your constipation, you may try taking a laxative. A variety of laxatives are available for treating constipation. The choice between them is based upon how they work, how safe the treatment is, and your healthcare provider's preferences. In general, laxatives can be categorized into the following groups:  Bulk forming laxatives - These include natural fiber and commercial fiber preparations such as: ?Psyllium (Konsyl; Metamucil; Perdiem) ?Methylcellulose (Citrucel) ?Calcium polycarbophil (FiberCon; Fiber-Lax; Mitrolan) ?Wheat dextrin (Benefiber) You should increase the dose of fiber supplements slowly to prevent gas and cramping, and you  should always take the supplement with plenty of fluid.  Hyperosmolar laxatives - Hyperosmolar laxatives include: ?Polyethylene glycol (MiraLax, Glycolax) ?Lactulose ?Sorbitol Polyethylene glycol is generally preferred since it does not cause gas or bloating and is available in the [place] without a prescription. Lactulose and sorbitol can produce gas and bloating.   Saline laxatives - Saline laxatives such as magnesium hydroxide (Milk of Magnesia) and magnesium citrate (Evac-Q-Mag) act similarly to the hyperosmolar laxatives.  Stimulant laxatives - Stimulant laxatives include senna (eg, Black Draught, Ex-lax, Fletcher's, [place], Senokot) and bisacodyl (eg, Correctol, Doxidan, Dulcolax). Some people overuse stimulant laxatives. Taking stimulant laxatives regularly or in large amounts can cause side effects, including low potassium levels. Thus, you should take these drugs carefully if you must use them regularly.   New treatments - Lubiprostone (Amitiza) is a prescription medication that treats severe constipation. It is expensive, but it may be recommended if you do not respond to other treatments. Linaclotide (Linzess) is another prescription medication that has been approved for the treatment of chronic constipation. It is also an expensive medication as compared with other agents with the exception of lubiprostone. Linaclotide may be recommended if you do not respond to other treatments.  Pills, suppositories, or enemas? - Laxatives are available as pills that you take by mouth or as suppositories  or enemas that you insert into the rectum. In general, suppositories and enemas work more quickly compared to pills, but many people do not like using them.   Constipation treatments to avoid ?Emollients - Emollient laxatives, principally mineral oil, soften stools by moisturizing them. However, other treatments have fewer risks and equal benefit. ?Natural products - A wide variety of  natural products are advertised for constipation. Some of them contain the active ingredients found in commercially available laxatives. However, their dose and purity may not be carefully controlled. Thus, these products are not generally recommended.  A variety of home-made enema preparations have been used throughout the years, such as soapsuds, hydrogen peroxide, and household detergents. These can be extremely irritating to the lining of the intestine and should be avoided.      Follow-up plan: Return if symptoms worsen or fail to improve.                              ############################################ ############################################ ############################################ ############################################    No outpatient medications have been marked as taking for the 09/17/18 encounter (Appointment) with Emeterio Reeve, DO.    Allergies  Allergen Reactions  . Alendronate Sodium   . Benztropine Mesylate [Benztropine]   . Citalopram   . Cymbalta [Duloxetine Hcl]   . Erythromycin   . Fluoxetine Hcl   . Glimepiride   . Nortriptyline Hcl   . Penicillins   . Sulfa Antibiotics   . Seroquel [Quetiapine Fumarate] Other (See Comments)    As per daughter, hallucinations.       Review of Systems:  Constitutional: No recent illness  HEENT: No  headache, no vision change  Cardiac: No  chest pain, No  pressure, No palpitations  Respiratory:  No  shortness of breath. No  Cough  Gastrointestinal: No  abdominal pain, +constipation - see HPI  Musculoskeletal: No new myalgia/arthralgia  Skin: No  Rash  Neurologic: No  weakness, No  Dizziness  Psychiatric: No  concerns with depression, No  concerns with anxiety  Exam:  BP 136/83 (BP Location: Left Arm, Patient Position: Sitting, Cuff Size: Normal)   Pulse 87   Temp 97.9 F (36.6 C) (Oral)   Wt 134 lb 3.2 oz (60.9 kg)   BMI 23.02 kg/m    Constitutional: VS see above. General Appearance: alert, well-developed, well-nourished, NAD  Eyes: Normal lids and conjunctive, non-icteric sclera  Ears, Nose, Mouth, Throat: MMM, Normal external inspection ears/nares/mouth/lips/gums.  Neck: No masses, trachea midline.   Respiratory: Normal respiratory effort. no wheeze, no rhonchi, no rales  Cardiovascular: S1/S2 normal, no murmur, no rub/gallop auscultated. RRR.   Musculoskeletal: Gait normal. Symmetric and independent movement of all extremities  Abdominal: non-tender except some discomfort epigastric and LUQ, non-distended, no appreciable organomegaly, neg Murphy's, BS WNLx4, percussion tympanic over area of discomfort LUQ, otherwise dull/tympanic alternating   Neurological: Normal balance/coordination. No tremor.  Skin: warm, dry, intact.   Psychiatric: Normal judgment/insight. Normal mood and affect. Oriented x3.       Visit summary with medication list and pertinent instructions was printed for patient to review, patient was advised to alert [place] if any updates are needed. All questions at time of visit were answered - patient instructed to contact office with any additional concerns. ER/RTC precautions were reviewed with the patient and understanding verbalized.   Note: Total time spent 25 minutes, greater than 50% of the visit was spent face-to-face counseling and coordinating care for the following:  The primary encounter diagnosis was Slow transit constipation. Diagnoses of Chronic idiopathic constipation and Need for influenza vaccination were also pertinent to this visit.Marland Kitchen  Please note: voice recognition software was used to produce this document, and typos may escape review. Please contact Dr. Sheppard Coil for any needed clarifications.    Follow up plan: Return if symptoms worsen or fail to improve.

## 2018-09-18 MED ORDER — PEG 3350-KCL-NA BICARB-NACL 420 G PO SOLR: 4000.0000 mL | Freq: Once | ORAL | 0 refills | Status: AC

## 2018-09-18 NOTE — Addendum Note (Signed)
Addended by: Maryla Morrow on: 09/18/2018 11:34 AM   Modules accepted: Orders

## 2018-09-24 ENCOUNTER — Encounter: Payer: Self-pay | Admitting: Family Medicine

## 2018-09-24 ENCOUNTER — Emergency Department (INDEPENDENT_AMBULATORY_CARE_PROVIDER_SITE_OTHER)
Admission: EM | Admit: 2018-09-24 | Discharge: 2018-09-24 | Disposition: A | Discharge status: Home / Self Care | Payer: Medicare Other | Source: Home / Self Care

## 2018-09-24 DIAGNOSIS — N390 Urinary tract infection, site not specified: Secondary | ICD-10-CM

## 2018-09-24 LAB — POCT URINALYSIS DIP (MANUAL ENTRY)
Bilirubin, UA: NEGATIVE
Blood, UA: NEGATIVE
Glucose, UA: NEGATIVE mg/dL
Ketones, POC UA: NEGATIVE mg/dL
Nitrite, UA: NEGATIVE
Protein Ur, POC: NEGATIVE mg/dL
Spec Grav, UA: 1.015 (ref 1.010–1.025)
Urobilinogen, UA: 0.2 E.U./dL
pH, UA: 6.5 (ref 5.0–8.0)

## 2018-09-24 MED ORDER — FLUCONAZOLE 150 MG PO TABS: 150.0000 mg | ORAL_TABLET | Freq: Once | ORAL | 0 refills | Status: AC

## 2018-09-24 MED ORDER — CEPHALEXIN 500 MG PO CAPS: 500.0000 mg | ORAL_CAPSULE | Freq: Three times a day (TID) | ORAL | 0 refills | Status: DC

## 2018-09-24 NOTE — ED Provider Notes (Signed)
[facility]    CSN: [id number] Arrival date & time: 09/24/18  1707     History   Chief Complaint Chief Complaint  Patient presents with  . Dysuria    HPI Bethany Park is a 84 y.o. female.   This is an established [facility] patient complaining of urinary problems.  Patient is not a good historian and tends to wander.  No acute pain, fever, abdominal pain  She is a 84 y.o. female who  has a past medical history of Anemia, Aortic stenosis, Atrial fibrillation ([place]), B12 deficiency (03/13/2018), Colon polyp (09/2013), Dementia, Depression with anxiety (03/13/2018), Essential hypertension (03/13/2018), Glucose intolerance (03/13/2018), Heart attack ([place]), Heart failure ([place]), History of COPD, History of coronary artery stent placement (09/2017), Moderate aortic stenosis (03/13/2018), Osteoporosis (03/13/2018), Ovarian cancer ([place]), Restless leg syndrome (03/13/2018), and Stroke ([address] [address]).     Past Medical History:  Diagnosis Date  . Anemia   . Aortic stenosis   . Atrial fibrillation ([place])   . B12 deficiency 03/13/2018  . Colon polyp 09/2013   in [place] state.  for personal hx polyps and fm hx colon ca.  found small transverse polyp, diverticulosis, int hemorrhoids.    . Dementia ([place])   . Depression with anxiety 03/13/2018  . Essential hypertension 03/13/2018  . Glucose intolerance 03/13/2018  . Heart attack ([place])   . Heart failure ([place])   . History of COPD   . History of coronary artery stent placement 09/2017  . Moderate aortic stenosis 03/13/2018  . Osteoporosis 03/13/2018  . Ovarian cancer ([place])   . Restless leg syndrome 03/13/2018  . Stroke [facility])     Patient Active Problem List   Diagnosis Date Noted  . Laceration of right forearm 05/11/2018  . Sundowning 05/11/2018  . Chronic low back pain 04/26/2018  . Chronic right hip pain 04/26/2018  . Melena   . UGIB (upper gastrointestinal bleed) 04/15/2018  . Anemia, blood loss 04/15/2018  . Depression 04/15/2018  . CAD  (coronary artery disease) 04/15/2018  . Fall 04/15/2018  . Hyponatremia 04/15/2018  . Acute renal failure superimposed on stage 3 chronic kidney disease ([place]) 04/15/2018  . UTI (urinary tract infection) 04/15/2018  . Hypotension   . Ambulatory dysfunction 04/03/2018  . Generalized abdominal pain 03/28/2018  . 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  . 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 Diabetic macular edema ([place]) 03/13/2018  . Mild nonproliferative diabetic retinopathy ([place]) 03/13/2018  . Atrial fibrillation ([place]) 03/13/2018  . Essential hypertension 03/13/2018  . History of coronary artery stent placement 03/13/2018  . ASCVD (arteriosclerotic cardiovascular disease) 03/13/2018  . Moderate aortic stenosis 03/13/2018  . Dementia ([place]) 03/13/2018  . HLD (hyperlipidemia) 03/13/2018  . B12 deficiency 03/13/2018  . Osteoporosis 03/13/2018  . Mild sleep apnea 03/13/2018  . Colon polyp 03/13/2018  . Restless leg syndrome 03/13/2018  . Macular degeneration 03/13/2018  . Anemia 03/13/2018  . Depression with anxiety 03/13/2018  . Overactive bladder 03/13/2018  . History of CVA (cerebrovascular accident) 03/13/2018  . Glucose intolerance 03/13/2018  . Crohn disease ([place]) 03/13/2018    Past Surgical History:  Procedure Laterality Date  . ABDOMINAL HYSTERECTOMY    . BACK SURGERY    . BREAST SURGERY    . CHOLECYSTECTOMY    . CORONARY STENT PLACEMENT    . ESOPHAGOGASTRODUODENOSCOPY N/A 04/16/2018   Procedure: ESOPHAGOGASTRODUODENOSCOPY (EGD);  Surgeon: Ladene Artist, MD;  Location: [facility] ENDOSCOPY;  Service: Endoscopy;  Laterality: N/A;  . EYE SURGERY    . HEMORROIDECTOMY    .  HOT HEMOSTASIS N/A 04/16/2018   Procedure: HOT HEMOSTASIS (ARGON PLASMA COAGULATION/BICAP);  Surgeon: Ladene Artist, MD;  Location[facility] ENDOSCOPY;  Service: Endoscopy;  Laterality: N/A;  . JOINT REPLACEMENT      OB History   None      Home Medications    Prior to Admission medications   Medication Sig  Start Date End Date Taking? Authorizing Provider  albuterol (PROVENTIL HFA;VENTOLIN HFA) 108 (90 Base) MCG/ACT inhaler Inhale 2 puffs into the lungs every 6 (six) hours as needed for wheezing. 06/07/18   Emeterio Reeve, DO  AMBULATORY NON [facility] bed with rails 04/26/18   Emeterio Reeve, DO  AMBULATORY NON [facility] MEDICATION Wheelchair 04/26/18   Emeterio Reeve, DO  amitriptyline (ELAVIL) 25 MG tablet Take 1 tablet (25 mg total) by mouth at bedtime. 04/26/18   Emeterio Reeve, DO  atorvastatin (LIPITOR) 40 MG tablet Take 1 tablet (40 mg total) by mouth daily. 03/15/18   Emeterio Reeve, DO  carvedilol (COREG) 3.125 MG tablet Take 1 tablet (3.125 mg total) by mouth 2 (two) times daily with a meal. 03/15/18   Emeterio Reeve, DO  cephALEXin (KEFLEX) 500 MG capsule Take 1 capsule (500 mg total) by mouth 3 (three) times daily. 09/24/18   Robyn Haber, MD  clopidogrel (PLAVIX) 75 MG tablet Take 1 tablet (75 mg total) by mouth daily. 04/18/18   Lelon Perla, MD  famotidine (PEPCID) 40 MG tablet Take 1 tablet (40 mg total) by mouth daily. 04/19/18   Shelly Coss, MD  fluconazole (DIFLUCAN) 150 MG tablet Take 1 tablet (150 mg total) by mouth once for 1 dose. Repeat if needed 09/24/18 09/24/18  Robyn Haber, MD  gabapentin (NEURONTIN) 300 MG capsule Take 3 capsules (900 mg total) by mouth at bedtime. 06/07/18 09/05/18  Emeterio Reeve, DO  HYDROcodone-acetaminophen (NORCO) 5-325 MG tablet Take 1 tablet by mouth every 6 (six) hours as needed for severe pain. #45 for 30 days 04/26/18   Emeterio Reeve, DO  lisinopril (PRINIVIL,ZESTRIL) 2.5 MG tablet Take 1 tablet (2.5 mg total) by mouth daily. 03/15/18   Emeterio Reeve, DO  lubiprostone (AMITIZA) 8 MCG capsule Take 1 capsule (8 mcg total) by mouth 2 (two) times daily with a meal. Start day after next bowel movement 09/17/18   Emeterio Reeve, DO  Melatonin 10 MG SUBL Place 10 mg under the tongue at  bedtime. 04/03/18   Emeterio Reeve, DO  nitroGLYCERIN (NITROSTAT) 0.4 MG SL tablet Place 1 tablet (0.4 mg total) under the tongue every 5 (five) minutes as needed for chest pain. If no better 10-15 mins, go to hospital 03/27/18   Emeterio Reeve, DO  nystatin (MYCOSTATIN/NYSTOP) powder Apply topically 4 (four) times daily. 05/10/18   Emeterio Reeve, DO  rOPINIRole (REQUIP) 3 MG tablet Take 1 tablet (3 mg total) by mouth 3 (three) times daily. 07/17/18   Emeterio Reeve, DO  umeclidinium-vilanterol (ANORO ELLIPTA) 62.5-25 MCG/INH AEPB Inhale 1 puff into the lungs daily. 06/07/18   Emeterio Reeve, DO    Family History Family History  Problem Relation Age of Onset  . Heart disease Mother   . Diabetes Mellitus II Mother   . Stroke Mother   . Colon cancer Father     Social History Social History   Tobacco Use  . Smoking status: Never Smoker  . Smokeless tobacco: Never Used  Substance Use Topics  . Alcohol use: Not Currently  . Drug use: Not Currently     Allergies   Alendronate sodium; Benztropine  mesylate [benztropine]; Citalopram; Cymbalta [duloxetine hcl]; Erythromycin; Fluoxetine hcl; Glimepiride; Nortriptyline hcl; Penicillins; Sulfa antibiotics; and Seroquel [quetiapine fumarate]   Review of Systems Review of Systems   Physical Exam Triage Vital Signs ED Triage Vitals  Enc Vitals Group     BP      Pulse      Resp      Temp      Temp src      SpO2      Weight      Height      Head Circumference      Peak Flow      Pain Score      Pain Loc      Pain Edu?      Excl. in [place]?    No data found.  Updated Vital Signs BP 118/70 (BP Location: Right Arm)   Temp 98.6 F (37 C) (Oral)   Wt 63.5 kg   SpO2 99%   BMI 24.02 kg/m    Physical Exam  Constitutional: She appears well-developed and well-nourished.  HENT:  Right Ear: External ear normal.  Left Ear: External ear normal.  Eyes: Conjunctivae are normal.  Neck: Normal range of motion.    Pulmonary/Chest: Effort normal.  Abdominal: Soft. There is no tenderness.  Musculoskeletal: Normal range of motion.  Neurological: She is alert.  Patient is not a good historian  Skin: Skin is warm and dry.  Nursing note and vitals reviewed.    UC Treatments / Results  Labs (all labs ordered are listed, but only abnormal results are displayed) Labs Reviewed  URINE CULTURE  POCT URINALYSIS DIP (MANUAL ENTRY)    EKG None  Radiology No results found.  Procedures Procedures (including critical care time)  Medications Ordered in UC Medications - No data to display  Initial Impression / Assessment and Plan / UC Course  I have reviewed the triage vital signs and the nursing notes.  Pertinent labs & imaging results that were available during my care of the patient were reviewed by me and considered in my medical decision making (see chart for details).    Final Clinical Impressions(s) / UC Diagnoses   Final diagnoses:  Lower urinary tract infectious disease     Discharge Instructions     Your urine does show signs of infection.  I have prescribed an antibiotic to be taken twice daily.   I also prescribed a yeast medicine to prevent vaginal yeast infection resulting from the antibiotic.    ED Prescriptions    Medication Sig Dispense Auth. Provider   cephALEXin (KEFLEX) 500 MG capsule Take 1 capsule (500 mg total) by mouth 3 (three) times daily. 20 capsule Robyn Haber, MD   fluconazole (DIFLUCAN) 150 MG tablet Take 1 tablet (150 mg total) by mouth once for 1 dose. Repeat if needed 2 tablet Robyn Haber, MD     Controlled Substance Prescriptions [place] Controlled Substance Registry consulted? Not Applicable   Robyn Haber, MD 09/24/18 1745

## 2018-09-24 NOTE — ED Triage Notes (Signed)
Pt c/o pain with urination. States it has been going on for "awhile".

## 2018-09-24 NOTE — Discharge Instructions (Signed)
Your urine does show signs of infection.  I have prescribed an antibiotic to be taken twice daily.   I also prescribed a yeast medicine to prevent vaginal yeast infection resulting from the antibiotic.

## 2018-09-25 LAB — URINE CULTURE
MICRO NUMBER:: 91418566
SPECIMEN QUALITY:: ADEQUATE

## 2018-09-26 ENCOUNTER — Telehealth: Payer: Self-pay | Admitting: *Deleted

## 2018-09-26 NOTE — Telephone Encounter (Signed)
LM with Ucx results and to call back if she has any questions or concerns. Charna Archer, LPN

## 2018-10-16 ENCOUNTER — Encounter: Payer: Self-pay | Admitting: Osteopathic Medicine

## 2018-10-16 ENCOUNTER — Ambulatory Visit (INDEPENDENT_AMBULATORY_CARE_PROVIDER_SITE_OTHER): Payer: Medicare Other | Admitting: Osteopathic Medicine

## 2018-10-16 VITALS — BP 170/69 | HR 98 | Temp 98.0°F | Wt 131.4 lb

## 2018-10-16 DIAGNOSIS — D508 Other iron deficiency anemias: Secondary | ICD-10-CM | POA: Diagnosis not present

## 2018-10-16 DIAGNOSIS — M25551 Pain in right hip: Secondary | ICD-10-CM | POA: Diagnosis not present

## 2018-10-16 DIAGNOSIS — I2510 Atherosclerotic heart disease of native coronary artery without angina pectoris: Secondary | ICD-10-CM

## 2018-10-16 DIAGNOSIS — G8929 Other chronic pain: Secondary | ICD-10-CM

## 2018-10-16 DIAGNOSIS — M545 Low back pain, unspecified: Secondary | ICD-10-CM

## 2018-10-16 MED ORDER — HYDROCODONE-ACETAMINOPHEN 5-325 MG PO TABS: 1.0000 | ORAL_TABLET | Freq: Four times a day (QID) | ORAL | 0 refills | Status: DC | PRN

## 2018-10-16 NOTE — Progress Notes (Signed)
HPI: Bethany Park is a 84 y.o. female who  has a past medical history of Anemia, Aortic stenosis, Atrial fibrillation ([place]), B12 deficiency (03/13/2018), Colon polyp (09/2013), Dementia ([place]), Depression with anxiety (03/13/2018), Essential hypertension (03/13/2018), Glucose intolerance (03/13/2018), Heart attack ([place]), Heart failure ([place]), History of COPD, History of coronary artery stent placement (09/2017), Moderate aortic stenosis (03/13/2018), Osteoporosis (03/13/2018), Ovarian cancer ([place]), Restless leg syndrome (03/13/2018), and Stroke ([place]).  she presents to [facility] today, 10/16/18,  for chief complaint of:  UTI follow-up   UTI: Seen in urgent care 09/24/18  Was tx w/ Keflex  UCx (+) multiple organisms likely colonizers No urinary frequency or dysuria now  Getting around ok and she is active.   Not taking medications - pt and daughter have been made aware of risks/benefits. Pt reports headache on occasion, some blurry vision when BP is high. Daughter reports home BP are much better than here in the office. No Cp/SOB, no weakness or facial droop, no speech changes.    Concerned about iron levels, po iron causes constipation, pt reports getting IV iron in the past     At today's visit... Past medical history, surgical history, and family history reviewed and updated as needed.  Current medication list and allergy/intolerance information reviewed and updated as needed. (See remainder of HPI, ROS, Phys Exam below)           ASSESSMENT/PLAN: The primary encounter diagnosis was Other iron deficiency anemia. Diagnoses of Chronic bilateral low back pain without sciatica and Chronic right hip pain were also pertinent to this visit.   Orders Placed This Encounter  Procedures  . CBC  . Fe+TIBC+Fer     Meds ordered this encounter  Medications  . HYDROcodone-acetaminophen (NORCO) 5-325 MG tablet    Sig: Take 1 tablet by mouth every 6 (six)  hours as needed for severe pain. #45 for 30 days    Dispense:  45 tablet    Refill:  0      Follow-up plan: Return for routine check-up in 3-4 months unless you need me sooner! .                             ############################################ ############################################ ############################################ ############################################    Current Meds  Medication Sig  . albuterol (PROVENTIL HFA;VENTOLIN HFA) 108 (90 Base) MCG/ACT inhaler Inhale 2 puffs into the lungs every 6 (six) hours as needed for wheezing.  . [facility] bed with rails  . AMBULATORY NON FORMULARY MEDICATION Wheelchair  . famotidine (PEPCID) 40 MG tablet Take 1 tablet (40 mg total) by mouth daily.  Marland Kitchen HYDROcodone-acetaminophen (NORCO) 5-325 MG tablet Take 1 tablet by mouth every 6 (six) hours as needed for severe pain. #45 for 30 days  . lisinopril (PRINIVIL,ZESTRIL) 2.5 MG tablet Take 1 tablet (2.5 mg total) by mouth daily.  . Melatonin 10 MG SUBL Place 10 mg under the tongue at bedtime.  . nitroGLYCERIN (NITROSTAT) 0.4 MG SL tablet Place 1 tablet (0.4 mg total) under the tongue every 5 (five) minutes as needed for chest pain. If no better 10-15 mins, go to hospital  . rOPINIRole (REQUIP) 3 MG tablet Take 1 tablet (3 mg total) by mouth 3 (three) times daily.  Marland Kitchen umeclidinium-vilanterol (ANORO ELLIPTA) 62.5-25 MCG/INH AEPB Inhale 1 puff into the lungs daily.  . [DISCONTINUED] HYDROcodone-acetaminophen (NORCO) 5-325 MG tablet Take 1 tablet by mouth every 6 (six) hours as  needed for severe pain. #45 for 30 days    Allergies  Allergen Reactions  . Alendronate Sodium   . Benztropine Mesylate [Benztropine]   . Citalopram   . Cymbalta [Duloxetine Hcl]   . Erythromycin   . Fluoxetine Hcl   . Glimepiride   . Nortriptyline Hcl   . Penicillins   . Sulfa Antibiotics   . Seroquel [Quetiapine Fumarate] Other  (See Comments)    As per daughter, hallucinations.       Review of Systems:  Constitutional: No recent illness  HEENT: +headache, +vision change  Cardiac: No  chest pain, No  pressure, No palpitations  Respiratory:  No  shortness of breath. No  Cough  Gastrointestinal: No  abdominal pain, no change on bowel habits  Musculoskeletal: No new myalgia/arthralgia  Skin: No  Rash  Hem/Onc: No  easy bruising/bleeding  Neurologic: No  weakness, No  Dizziness  Psychiatric: No  concerns with depression, No  concerns with anxiety  Exam:  BP (!) 170/69 (BP Location: Left Arm, Patient Position: Sitting, Cuff Size: Normal)   Pulse 98   Temp 98 F (36.7 C) (Oral)   Wt 131 lb 6.4 oz (59.6 kg)   BMI 22.54 kg/m   Constitutional: VS see above. General Appearance: alert, well-developed, well-nourished, NAD  Eyes: Normal lids and conjunctive, non-icteric sclera  Ears, Nose, Mouth, Throat: MMM, Normal external inspection ears/nares/mouth/lips/gums.  Neck: No masses, trachea midline.   Respiratory: Normal respiratory effort. no wheeze, no rhonchi, no rales  Cardiovascular: S1/S2 normal, harsh 4/6 systolic murmur, no rub/gallop auscultated. RRR.   Musculoskeletal: Gait normal w/ walker, she is very hunched, pronounced thoracic kyphosis. Symmetric and independent movement of all extremities  Neurological: Normal balance/coordination.  Skin: warm, dry, intact.   Psychiatric: Fair judgment/insight. Normal mood and affect. Oriented x3.       Visit summary with medication list and pertinent instructions was printed for patient to review, patient was advised to alert [place] if any updates are needed. All questions at time of visit were answered - patient instructed to contact office with any additional concerns. ER/RTC precautions were reviewed with the patient and understanding verbalized.      Please note: voice recognition software was used to produce this document, and typos may  escape review. Please contact Dr. Sheppard Coil for any needed clarifications.    Follow up plan: Return for routine check-up in 3-4 months unless you need me sooner! Marland Kitchen

## 2018-10-17 LAB — IRON,TIBC AND FERRITIN PANEL
%SAT: 31 % (calc) (ref 16–45)
Ferritin: 73 ng/mL (ref 16–288)
Iron: 94 ug/dL (ref 45–160)
TIBC: 300 mcg/dL (calc) (ref 250–450)

## 2018-10-17 LAB — CBC
HCT: 29 % — ABNORMAL LOW (ref 35.0–45.0)
Hemoglobin: 9.9 g/dL — ABNORMAL LOW (ref 11.7–15.5)
MCH: 32.9 pg (ref 27.0–33.0)
MCHC: 34.1 g/dL (ref 32.0–36.0)
MCV: 96.3 fL (ref 80.0–100.0)
MPV: 10.1 fL (ref 7.5–12.5)
Platelets: 219 10*3/uL (ref 140–400)
RBC: 3.01 10*6/uL — AB (ref 3.80–5.10)
RDW: 13 % (ref 11.0–15.0)
WBC: 5.2 10*3/uL (ref 3.8–10.8)

## 2018-11-11 ENCOUNTER — Other Ambulatory Visit: Payer: Self-pay

## 2018-11-11 ENCOUNTER — Emergency Department (HOSPITAL_COMMUNITY): Payer: Medicare Other

## 2018-11-11 ENCOUNTER — Encounter (HOSPITAL_COMMUNITY): Payer: Self-pay

## 2018-11-11 ENCOUNTER — Observation Stay (HOSPITAL_COMMUNITY)
Admission: EM | Admit: 2018-11-11 | Discharge: 2018-11-12 | Disposition: A | Discharge status: Home / Self Care | Payer: Medicare Other | Attending: Internal Medicine | Admitting: Internal Medicine

## 2018-11-11 DIAGNOSIS — Z882 Allergy status to sulfonamides status: Secondary | ICD-10-CM | POA: Insufficient documentation

## 2018-11-11 DIAGNOSIS — R269 Unspecified abnormalities of gait and mobility: Secondary | ICD-10-CM | POA: Insufficient documentation

## 2018-11-11 DIAGNOSIS — I609 Nontraumatic subarachnoid hemorrhage, unspecified: Principal | ICD-10-CM

## 2018-11-11 DIAGNOSIS — I2510 Atherosclerotic heart disease of native coronary artery without angina pectoris: Secondary | ICD-10-CM | POA: Diagnosis not present

## 2018-11-11 DIAGNOSIS — I10 Essential (primary) hypertension: Secondary | ICD-10-CM

## 2018-11-11 DIAGNOSIS — E1122 Type 2 diabetes mellitus with diabetic chronic kidney disease: Secondary | ICD-10-CM | POA: Insufficient documentation

## 2018-11-11 DIAGNOSIS — Z880 Allergy status to penicillin: Secondary | ICD-10-CM | POA: Insufficient documentation

## 2018-11-11 DIAGNOSIS — Z881 Allergy status to other antibiotic agents status: Secondary | ICD-10-CM | POA: Diagnosis not present

## 2018-11-11 DIAGNOSIS — G600 Hereditary motor and sensory neuropathy: Secondary | ICD-10-CM | POA: Insufficient documentation

## 2018-11-11 DIAGNOSIS — I129 Hypertensive chronic kidney disease with stage 1 through stage 4 chronic kidney disease, or unspecified chronic kidney disease: Secondary | ICD-10-CM | POA: Insufficient documentation

## 2018-11-11 DIAGNOSIS — E11311 Type 2 diabetes mellitus with unspecified diabetic retinopathy with macular edema: Secondary | ICD-10-CM | POA: Diagnosis present

## 2018-11-11 DIAGNOSIS — Z8673 Personal history of transient ischemic attack (TIA), and cerebral infarction without residual deficits: Secondary | ICD-10-CM | POA: Insufficient documentation

## 2018-11-11 DIAGNOSIS — I4819 Other persistent atrial fibrillation: Secondary | ICD-10-CM | POA: Insufficient documentation

## 2018-11-11 DIAGNOSIS — F0390 Unspecified dementia without behavioral disturbance: Secondary | ICD-10-CM | POA: Insufficient documentation

## 2018-11-11 DIAGNOSIS — G8929 Other chronic pain: Secondary | ICD-10-CM | POA: Diagnosis not present

## 2018-11-11 DIAGNOSIS — M545 Low back pain: Secondary | ICD-10-CM | POA: Insufficient documentation

## 2018-11-11 DIAGNOSIS — Z888 Allergy status to other drugs, medicaments and biological substances status: Secondary | ICD-10-CM | POA: Insufficient documentation

## 2018-11-11 DIAGNOSIS — R262 Difficulty in walking, not elsewhere classified: Secondary | ICD-10-CM | POA: Diagnosis present

## 2018-11-11 DIAGNOSIS — Z955 Presence of coronary angioplasty implant and graft: Secondary | ICD-10-CM | POA: Diagnosis not present

## 2018-11-11 DIAGNOSIS — W19XXXA Unspecified fall, initial encounter: Secondary | ICD-10-CM | POA: Diagnosis not present

## 2018-11-11 DIAGNOSIS — N183 Chronic kidney disease, stage 3 unspecified: Secondary | ICD-10-CM | POA: Diagnosis present

## 2018-11-11 DIAGNOSIS — Z66 Do not resuscitate: Secondary | ICD-10-CM | POA: Diagnosis not present

## 2018-11-11 DIAGNOSIS — S199XXA Unspecified injury of neck, initial encounter: Secondary | ICD-10-CM | POA: Diagnosis not present

## 2018-11-11 DIAGNOSIS — I4891 Unspecified atrial fibrillation: Secondary | ICD-10-CM | POA: Diagnosis present

## 2018-11-11 DIAGNOSIS — N179 Acute kidney failure, unspecified: Secondary | ICD-10-CM | POA: Diagnosis present

## 2018-11-11 DIAGNOSIS — Z79899 Other long term (current) drug therapy: Secondary | ICD-10-CM | POA: Diagnosis not present

## 2018-11-11 DIAGNOSIS — Z7902 Long term (current) use of antithrombotics/antiplatelets: Secondary | ICD-10-CM | POA: Insufficient documentation

## 2018-11-11 DIAGNOSIS — I350 Nonrheumatic aortic (valve) stenosis: Secondary | ICD-10-CM | POA: Insufficient documentation

## 2018-11-11 DIAGNOSIS — S066X9A Traumatic subarachnoid hemorrhage with loss of consciousness of unspecified duration, initial encounter: Secondary | ICD-10-CM | POA: Diagnosis not present

## 2018-11-11 DIAGNOSIS — R410 Disorientation, unspecified: Secondary | ICD-10-CM | POA: Diagnosis not present

## 2018-11-11 LAB — BASIC METABOLIC PANEL
Anion gap: 9 (ref 5–15)
BUN: 25 mg/dL — ABNORMAL HIGH (ref 8–23)
CO2: 23 mmol/L (ref 22–32)
Calcium: 9.5 mg/dL (ref 8.9–10.3)
Chloride: 101 mmol/L (ref 98–111)
Creatinine, Ser: 1.05 mg/dL — ABNORMAL HIGH (ref 0.44–1.00)
GFR calc non Af Amer: 47 mL/min — ABNORMAL LOW (ref >60)
GFR, EST AFRICAN AMERICAN: 55 mL/min — AB (ref >60)
Glucose, Bld: 126 mg/dL — ABNORMAL HIGH (ref 70–99)
Potassium: 4 mmol/L (ref 3.5–5.1)
Sodium: 133 mmol/L — ABNORMAL LOW (ref 135–145)

## 2018-11-11 LAB — CBC WITH DIFFERENTIAL/PLATELET
ABS IMMATURE GRANULOCYTES: 0.04 10*3/uL (ref 0.00–0.07)
Basophils Absolute: 0 10*3/uL (ref 0.0–0.1)
Basophils Relative: 0 %
Eosinophils Absolute: 0.1 10*3/uL (ref 0.0–0.5)
Eosinophils Relative: 1 %
HCT: 33.2 % — ABNORMAL LOW (ref 36.0–46.0)
Hemoglobin: 10.9 g/dL — ABNORMAL LOW (ref 12.0–15.0)
Immature Granulocytes: 1 %
Lymphocytes Relative: 9 %
Lymphs Abs: 0.7 10*3/uL (ref 0.7–4.0)
MCH: 32.2 pg (ref 26.0–34.0)
MCHC: 32.8 g/dL (ref 30.0–36.0)
MCV: 98.2 fL (ref 80.0–100.0)
Monocytes Absolute: 0.6 10*3/uL (ref 0.1–1.0)
Monocytes Relative: 7 %
NEUTROS ABS: 7.1 10*3/uL (ref 1.7–7.7)
Neutrophils Relative %: 82 %
Platelets: 201 10*3/uL (ref 150–400)
RBC: 3.38 MIL/uL — ABNORMAL LOW (ref 3.87–5.11)
RDW: 12.7 % (ref 11.5–15.5)
WBC: 8.5 10*3/uL (ref 4.0–10.5)
nRBC: 0 % (ref 0.0–0.2)

## 2018-11-11 MED ORDER — SENNOSIDES-DOCUSATE SODIUM 8.6-50 MG PO TABS
1.0000 | ORAL_TABLET | Freq: Every evening | ORAL | Status: DC | PRN
  Administered 2018-11-12: 1 via ORAL
  Filled 2018-11-11: qty 1

## 2018-11-11 MED ORDER — HYDROCODONE-ACETAMINOPHEN 5-325 MG PO TABS
1.0000 | ORAL_TABLET | Freq: Four times a day (QID) | ORAL | Status: DC | PRN
  Administered 2018-11-12 (×2): 1 via ORAL
  Filled 2018-11-11 (×2): qty 1

## 2018-11-11 MED ORDER — MELATONIN 3 MG PO TABS
9.0000 mg | ORAL_TABLET | Freq: Every day | ORAL | Status: DC
  Administered 2018-11-11: 9 mg via ORAL
  Filled 2018-11-11 (×3): qty 3

## 2018-11-11 MED ORDER — FENTANYL CITRATE (PF) 100 MCG/2ML IJ SOLN
25.0000 ug | INTRAMUSCULAR | Status: DC | PRN
  Administered 2018-11-11: 25 ug via INTRAVENOUS
  Filled 2018-11-11: qty 2

## 2018-11-11 MED ORDER — UMECLIDINIUM-VILANTEROL 62.5-25 MCG/INH IN AEPB
1.0000 | INHALATION_SPRAY | Freq: Every day | RESPIRATORY_TRACT | Status: DC
  Administered 2018-11-12: 1 via RESPIRATORY_TRACT
  Filled 2018-11-11: qty 14

## 2018-11-11 MED ORDER — LUBIPROSTONE 8 MCG PO CAPS
8.0000 ug | ORAL_CAPSULE | Freq: Two times a day (BID) | ORAL | Status: DC
  Administered 2018-11-12: 8 ug via ORAL
  Filled 2018-11-11 (×2): qty 1

## 2018-11-11 MED ORDER — ONDANSETRON HCL 4 MG/2ML IJ SOLN
4.0000 mg | Freq: Four times a day (QID) | INTRAMUSCULAR | Status: DC | PRN
  Administered 2018-11-12: 4 mg via INTRAVENOUS
  Filled 2018-11-11: qty 2

## 2018-11-11 MED ORDER — AMITRIPTYLINE HCL 25 MG PO TABS: 25.0000 mg | ORAL_TABLET | Freq: Every day | ORAL | Status: DC

## 2018-11-11 MED ORDER — LISINOPRIL 2.5 MG PO TABS
2.5000 mg | ORAL_TABLET | Freq: Every day | ORAL | Status: DC
  Administered 2018-11-12: 2.5 mg via ORAL
  Filled 2018-11-11: qty 1

## 2018-11-11 MED ORDER — ALBUTEROL SULFATE (2.5 MG/3ML) 0.083% IN NEBU: 3.0000 mL | INHALATION_SOLUTION | Freq: Four times a day (QID) | RESPIRATORY_TRACT | Status: DC | PRN

## 2018-11-11 MED ORDER — ONDANSETRON HCL 4 MG/2ML IJ SOLN
4.0000 mg | Freq: Once | INTRAMUSCULAR | Status: AC
  Administered 2018-11-11: 4 mg via INTRAVENOUS
  Filled 2018-11-11: qty 2

## 2018-11-11 MED ORDER — SODIUM CHLORIDE 0.9 % IV SOLN
INTRAVENOUS | Status: DC
  Administered 2018-11-11 – 2018-11-12 (×2): via INTRAVENOUS

## 2018-11-11 MED ORDER — ACETAMINOPHEN 325 MG PO TABS
650.0000 mg | ORAL_TABLET | Freq: Once | ORAL | Status: AC
  Administered 2018-11-11: 650 mg via ORAL
  Filled 2018-11-11: qty 2

## 2018-11-11 MED ORDER — CARVEDILOL 3.125 MG PO TABS
3.1250 mg | ORAL_TABLET | Freq: Two times a day (BID) | ORAL | Status: DC
  Administered 2018-11-12: 3.125 mg via ORAL
  Filled 2018-11-11 (×2): qty 1

## 2018-11-11 MED ORDER — FAMOTIDINE 20 MG PO TABS
40.0000 mg | ORAL_TABLET | Freq: Every day | ORAL | Status: DC
  Administered 2018-11-12: 40 mg via ORAL
  Filled 2018-11-11: qty 2

## 2018-11-11 MED ORDER — ONDANSETRON HCL 4 MG PO TABS: 4.0000 mg | ORAL_TABLET | Freq: Four times a day (QID) | ORAL | Status: DC | PRN

## 2018-11-11 MED ORDER — NITROGLYCERIN 0.4 MG SL SUBL: 0.4000 mg | SUBLINGUAL_TABLET | SUBLINGUAL | Status: DC | PRN

## 2018-11-11 MED ORDER — ROPINIROLE HCL 1 MG PO TABS
3.0000 mg | ORAL_TABLET | Freq: Three times a day (TID) | ORAL | Status: DC
  Administered 2018-11-12 (×2): 3 mg via ORAL
  Filled 2018-11-11 (×2): qty 3

## 2018-11-11 NOTE — Progress Notes (Signed)
Patient ID: Bethany Park, female   DOB: 02-13-1927, 85 y.o.   MRN: [id number] 85 yo female who suffered a mechanical fall, some H/A, neurologic baseline, CT head shows small amt tSAH near old infarct. Would follow standard mild CHI/ concussion guidelines, as this requires no neurosurgical intervention. If she is admitted for obs, a simple repeat head CT in am can be considered reasonable but not an absolute necessity unless there is some clinical concern. Could hold plavix for 3 -5 days, though it'll be a week before the effects of holding it are seen and therefore must weight the risks of stopping it (ie, the reason she's on it in the first place at 89) vs the risks of continuing it which are quite difficult to quantify accurately. Please call if we can help in any way.

## 2018-11-11 NOTE — ED Triage Notes (Signed)
Pt brought in by EMS from home for a fall this afternoon. Pt was on electric wheelchair walking her dog when the dog pulled on the leash and knocked the pt over. Pt hit head on sidewalk, large hematoma above left eye, bleeding controlled. Pt asking repetitive questions, confused. Pt daughter states pt has hx of dementia but is usually A+Ox4, pt currently only oriented to self. Small abrasion to left elbow and left hand. Pt is on plavix.

## 2018-11-11 NOTE — ED Provider Notes (Signed)
I received this patient in signout from Dr. Eulis Foster. We were awaiting for labwork results. Head CT had showed small SAH, Dr. Ronnald Ramp with [facility] advised no surgical intervention. labwork overall unremarkable. Discussed w/ [facility], Dr. Evangeline Gula, and pt admitted for further care.   Little, Wenda Overland, MD 11/11/18 1753

## 2018-11-11 NOTE — ED Provider Notes (Signed)
[facility] EMERGENCY DEPARTMENT Provider Note   CSN: [id number] Arrival date & time: 11/11/18  1235     History   Chief Complaint Chief Complaint  Patient presents with  . Fall    HPI Bethany Park is a 85 y.o. female.  HPI   She presents for evaluation of injury from fall.  She was sitting on a scooter, holding onto her dog's leash, when it turned and pulled her over.  She landed on concrete striking her head.  Her daughter was 1200 yards behind her, got to her and patient was alert and communicative.  Patient did not get up and EMS arrived to transport her to the hospital.  There is been no loss of consciousness.  There is been no nausea or vomiting.  Patient cannot recall the incident which is not unusual since she has "early dementia," according to daughter who gives history today.  Been no recent misses.  There are no other known injuries from the fall today.  There are no other known modifying factors.  Past Medical History:  Diagnosis Date  . Anemia   . Aortic stenosis   . Atrial fibrillation ([place])   . B12 deficiency 03/13/2018  . Colon polyp 09/2013   in [place] state.  for personal hx polyps and fm hx colon ca.  found small transverse polyp, diverticulosis, int hemorrhoids.    . Dementia ([place])   . Depression with anxiety 03/13/2018  . Essential hypertension 03/13/2018  . Glucose intolerance 03/13/2018  . Heart attack ([place])   . Heart failure ([place])   . History of COPD   . History of coronary artery stent placement 09/2017  . Moderate aortic stenosis 03/13/2018  . Osteoporosis 03/13/2018  . Ovarian cancer ([place])   . Restless leg syndrome 03/13/2018  . Stroke [facility])     Patient Active Problem List   Diagnosis Date Noted  . Laceration of right forearm 05/11/2018  . Sundowning 05/11/2018  . Chronic low back pain 04/26/2018  . Chronic right hip pain 04/26/2018  . Melena   . UGIB (upper gastrointestinal bleed) 04/15/2018  . Anemia, blood loss  04/15/2018  . Depression 04/15/2018  . CAD (coronary artery disease) 04/15/2018  . Fall 04/15/2018  . Hyponatremia 04/15/2018  . Acute renal failure superimposed on stage 3 chronic kidney disease ([place]) 04/15/2018  . UTI (urinary tract infection) 04/15/2018  . Hypotension   . Ambulatory dysfunction 04/03/2018  . Generalized abdominal pain 03/28/2018  . 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  . 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 Diabetic macular edema ([place]) 03/13/2018  . Mild nonproliferative diabetic retinopathy ([place]) 03/13/2018  . Atrial fibrillation ([place]) 03/13/2018  . Essential hypertension 03/13/2018  . History of coronary artery stent placement 03/13/2018  . ASCVD (arteriosclerotic cardiovascular disease) 03/13/2018  . Moderate aortic stenosis 03/13/2018  . Dementia ([place]) 03/13/2018  . HLD (hyperlipidemia) 03/13/2018  . B12 deficiency 03/13/2018  . Osteoporosis 03/13/2018  . Mild sleep apnea 03/13/2018  . Colon polyp 03/13/2018  . Restless leg syndrome 03/13/2018  . Macular degeneration 03/13/2018  . Anemia 03/13/2018  . Depression with anxiety 03/13/2018  . Overactive bladder 03/13/2018  . History of CVA (cerebrovascular accident) 03/13/2018  . Glucose intolerance 03/13/2018  . Crohn disease ([place]) 03/13/2018    Past Surgical History:  Procedure Laterality Date  . ABDOMINAL HYSTERECTOMY    . BACK SURGERY    . BREAST SURGERY    . CHOLECYSTECTOMY    . CORONARY STENT PLACEMENT    . ESOPHAGOGASTRODUODENOSCOPY N/A  04/16/2018   Procedure: ESOPHAGOGASTRODUODENOSCOPY (EGD);  Surgeon: Ladene Artist, MD;  Location: [facility] ENDOSCOPY;  Service: Endoscopy;  Laterality: N/A;  . EYE SURGERY    . HEMORROIDECTOMY    . HOT HEMOSTASIS N/A 04/16/2018   Procedure: HOT HEMOSTASIS (ARGON PLASMA COAGULATION/BICAP);  Surgeon: Ladene Artist, MD;  Location: [facility] ENDOSCOPY;  Service: Endoscopy;  Laterality: N/A;  . JOINT REPLACEMENT       OB History   No obstetric history on file.       Home Medications    Prior to Admission medications   Medication Sig Start Date End Date Taking? Authorizing Provider  albuterol (PROVENTIL HFA;VENTOLIN HFA) 108 (90 Base) MCG/ACT inhaler Inhale 2 puffs into the lungs every 6 (six) hours as needed for wheezing. 06/07/18  Yes Emeterio Reeve, DO  carvedilol (COREG) 3.125 MG tablet Take 1 tablet (3.125 mg total) by mouth 2 (two) times daily with a meal. 03/15/18  Yes Emeterio Reeve, DO  clopidogrel (PLAVIX) 75 MG tablet Take 1 tablet (75 mg total) by mouth daily. 04/18/18  Yes Lelon Perla, MD  famotidine (PEPCID) 40 MG tablet Take 1 tablet (40 mg total) by mouth daily. 04/19/18  Yes Shelly Coss, MD  HYDROcodone-acetaminophen (NORCO) 5-325 MG tablet Take 1 tablet by mouth every 6 (six) hours as needed for severe pain. #45 for 30 days 10/16/18  Yes Emeterio Reeve, DO  lisinopril (PRINIVIL,ZESTRIL) 2.5 MG tablet Take 1 tablet (2.5 mg total) by mouth daily. 03/15/18  Yes Emeterio Reeve, DO  lubiprostone (AMITIZA) 8 MCG capsule Take 1 capsule (8 mcg total) by mouth 2 (two) times daily with a meal. Start day after next bowel movement 09/17/18  Yes Emeterio Reeve, DO  Melatonin 10 MG SUBL Place 10 mg under the tongue at bedtime. 04/03/18  Yes Emeterio Reeve, DO  nystatin (MYCOSTATIN/NYSTOP) powder Apply topically 4 (four) times daily. 05/10/18  Yes Emeterio Reeve, DO  rOPINIRole (REQUIP) 3 MG tablet Take 1 tablet (3 mg total) by mouth 3 (three) times daily. 07/17/18  Yes Emeterio Reeve, DO  umeclidinium-vilanterol (ANORO ELLIPTA) 62.5-25 MCG/INH AEPB Inhale 1 puff into the lungs daily. 06/07/18  Yes Emeterio Reeve, DO  AMBULATORY NON [facility] bed with rails 04/26/18   Emeterio Reeve, DO  AMBULATORY NON [facility] MEDICATION Wheelchair 04/26/18   Emeterio Reeve, DO  amitriptyline (ELAVIL) 25 MG tablet Take 1 tablet (25 mg total) by mouth at bedtime. Patient not taking: Reported on 10/16/2018  04/26/18   Emeterio Reeve, DO  atorvastatin (LIPITOR) 40 MG tablet Take 1 tablet (40 mg total) by mouth daily. Patient not taking: Reported on 10/16/2018 03/15/18   Emeterio Reeve, DO  cephALEXin (KEFLEX) 500 MG capsule Take 1 capsule (500 mg total) by mouth 3 (three) times daily. Patient not taking: Reported on 10/16/2018 09/24/18   Robyn Haber, MD  gabapentin (NEURONTIN) 300 MG capsule Take 3 capsules (900 mg total) by mouth at bedtime. 06/07/18 09/05/18  Emeterio Reeve, DO  nitroGLYCERIN (NITROSTAT) 0.4 MG SL tablet Place 1 tablet (0.4 mg total) under the tongue every 5 (five) minutes as needed for chest pain. If no better 10-15 mins, go to hospital 03/27/18   Emeterio Reeve, DO    Family History Family History  Problem Relation Age of Onset  . Heart disease Mother   . Diabetes Mellitus II Mother   . Stroke Mother   . Colon cancer Father     Social History Social History   Tobacco Use  . Smoking status: Never Smoker  .  Smokeless tobacco: Never Used  Substance Use Topics  . Alcohol use: Not Currently  . Drug use: Not Currently     Allergies   Alendronate sodium; Benztropine mesylate [benztropine]; Citalopram; Cymbalta [duloxetine hcl]; Erythromycin; Fluoxetine hcl; Glimepiride; Nortriptyline hcl; Penicillins; Sulfa antibiotics; and Seroquel [quetiapine fumarate]   Review of Systems Review of Systems  All other systems reviewed and are negative.    Physical Exam Updated Vital Signs BP (!) 207/91   Pulse 64   Temp 98 F (36.7 C) (Oral)   Resp (!) 21   SpO2 99%   Physical Exam Vitals signs and nursing note reviewed.  Constitutional:      General: She is not in acute distress.    Appearance: She is well-developed. She is not ill-appearing or diaphoretic.     Comments: Elderly, frail  HENT:     Head: Normocephalic.     Comments: Left forehead contusion, with ecchymosis and abrasion, no laceration.  No associated crepitation.  No midface instability  or evident injury.    Right Ear: External ear normal.     Left Ear: External ear normal.     Nose: Nose normal. No congestion or rhinorrhea.     Mouth/Throat:     Mouth: Mucous membranes are moist.     Pharynx: No oropharyngeal exudate.     Comments: No trismus. Eyes:     Conjunctiva/sclera: Conjunctivae normal.     Pupils: Pupils are equal, round, and reactive to light.  Neck:     Musculoskeletal: Normal range of motion and neck supple.     Trachea: Phonation normal.  Cardiovascular:     Rate and Rhythm: Normal rate and regular rhythm.  Pulmonary:     Effort: Pulmonary effort is normal. No respiratory distress.     Breath sounds: Normal breath sounds. No stridor.  Chest:     Chest wall: No tenderness.  Abdominal:     General: There is no distension.     Palpations: Abdomen is soft.     Tenderness: There is no abdominal tenderness. There is no guarding.  Musculoskeletal: Normal range of motion.        General: No tenderness or signs of injury.     Comments: Normal range of motion arms and legs bilaterally.  Small abrasion left anterior knee, not bleeding.  Skin:    General: Skin is warm and dry.  Neurological:     Mental Status: She is alert and oriented to person, place, and time.     Motor: No abnormal muscle tone.  Psychiatric:        Mood and Affect: Mood normal.        Behavior: Behavior normal.      ED Treatments / Results  Labs (all labs ordered are listed, but only abnormal results are displayed) Labs Reviewed  BASIC METABOLIC PANEL  CBC WITH DIFFERENTIAL/PLATELET    EKG None  Radiology Ct Head Wo Contrast  Result Date: 11/11/2018 CLINICAL DATA:  Fall.  Left forehead hematoma. EXAM: CT HEAD WITHOUT CONTRAST CT CERVICAL SPINE WITHOUT CONTRAST TECHNIQUE: Multidetector CT imaging of the head and cervical spine was performed following the standard protocol without intravenous contrast. Multiplanar CT image reconstructions of the cervical spine were also  generated. COMPARISON:  CT head dated April 15, 2018.  The lungs are FINDINGS: CT HEAD FINDINGS Brain: New trace subarachnoid blood in the left occipital lobe adjacent to the old left parieto-occipital infarct. There is also a small amount of trace subarachnoid blood in the  posterior left cerebellar hemisphere. No evidence of acute infarction, hydrocephalus, extra-axial collection or mass lesion/mass effect. Stable mild atrophy and chronic microvascular ischemic changes. Vascular: Calcified atherosclerosis at the skullbase. No hyperdense vessel. Skull: Negative for fracture or focal lesion. Sinuses/Orbits: No acute finding. Chronic right maxillary sinusitis. Other: Large left frontal scalp hematoma.  Old nasal bone fractures. CT CERVICAL SPINE FINDINGS Alignment: No traumatic malalignment. Reversal of the normal cervical lordosis at C4-C5. 2 mm facet mediated anterolisthesis at C3-C4. Skull base and vertebrae: No acute fracture. No primary bone lesion or focal pathologic process. 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 Soft tissues and spinal canal: No prevertebral fluid or swelling. No visible canal hematoma. Disc levels: Multilevel degenerative disc disease of the cervical spine, moderate at C5-C6. Scattered mild to moderate bilateral facet arthropathy. Upper chest: Negative. Other: None. IMPRESSION: CT head: 1. Trace subarachnoid hemorrhage in the left occipital lobe and posterior left cerebellar hemisphere. 2. Large left frontal scalp hematoma. 3. 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 CT cervical spine: 1.  No acute cervical spine fracture. 2. Mild-to-moderate cervical spondylosis. Critical Value/emergent results were called by telephone at the time of interpretation on 11/11/2018 at 2:58 pm to Dr. Daleen Bo , who verbally acknowledged these results. Electronically Signed   By: Titus Dubin M.D.   On: 11/11/2018 15:01   Ct Cervical Spine Wo Contrast  Result Date: 11/11/2018 CLINICAL  DATA:  Fall.  Left forehead hematoma. EXAM: CT HEAD WITHOUT CONTRAST CT CERVICAL SPINE WITHOUT CONTRAST TECHNIQUE: Multidetector CT imaging of the head and cervical spine was performed following the standard protocol without intravenous contrast. Multiplanar CT image reconstructions of the cervical spine were also generated. COMPARISON:  CT head dated April 15, 2018.  The lungs are FINDINGS: CT HEAD FINDINGS Brain: New trace subarachnoid blood in the left occipital lobe adjacent to the old left parieto-occipital infarct. There is also a small amount of trace subarachnoid blood in the posterior left cerebellar hemisphere. No evidence of acute infarction, hydrocephalus, extra-axial collection or mass lesion/mass effect. Stable mild atrophy and chronic microvascular ischemic changes. Vascular: Calcified atherosclerosis at the skullbase. No hyperdense vessel. Skull: Negative for fracture or focal lesion. Sinuses/Orbits: No acute finding. Chronic right maxillary sinusitis. Other: Large left frontal scalp hematoma.  Old nasal bone fractures. CT CERVICAL SPINE FINDINGS Alignment: No traumatic malalignment. Reversal of the normal cervical lordosis at C4-C5. 2 mm facet mediated anterolisthesis at C3-C4. Skull base and vertebrae: No acute fracture. No primary bone lesion or focal pathologic process. 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 Soft tissues and spinal canal: No prevertebral fluid or swelling. No visible canal hematoma. Disc levels: Multilevel degenerative disc disease of the cervical spine, moderate at C5-C6. Scattered mild to moderate bilateral facet arthropathy. Upper chest: Negative. Other: None. IMPRESSION: CT head: 1. Trace subarachnoid hemorrhage in the left occipital lobe and posterior left cerebellar hemisphere. 2. Large left frontal scalp hematoma. 3. 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 CT cervical spine: 1.  No acute cervical spine fracture. 2. Mild-to-moderate cervical spondylosis.  Critical Value/emergent results were called by telephone at the time of interpretation on 11/11/2018 at 2:58 pm to Dr. Daleen Bo , who verbally acknowledged these results. Electronically Signed   By: Titus Dubin M.D.   On: 11/11/2018 15:01    Procedures .Critical Care Performed by: Daleen Bo, MD Authorized by: Daleen Bo, MD   Critical care provider statement:    Critical care time (minutes):  35   Critical care start time:  11/11/2018 1:05 PM   Critical care end time:  11/11/2018 3:25 PM   Critical care time was exclusive of:  Separately billable procedures and treating other patients   Critical care was necessary to treat or prevent imminent or life-threatening deterioration of the following conditions:  CNS failure or compromise   Critical care was time spent personally by me on the following activities:  Blood draw for specimens, development of treatment plan with patient or surrogate, discussions with consultants, evaluation of patient's response to treatment, examination of patient, obtaining history from patient or surrogate, ordering and performing treatments and interventions, ordering and review of laboratory studies, pulse oximetry, re-evaluation of patient's condition, review of old charts and ordering and review of radiographic studies   (including critical care time)  Medications Ordered in ED Medications  fentaNYL (SUBLIMAZE) injection 25 mcg (has no administration in time range)  ondansetron (ZOFRAN) injection 4 mg (has no administration in time range)  0.9 %  sodium chloride infusion (has no administration in time range)  acetaminophen (TYLENOL) tablet 650 mg (650 mg Oral Given 11/11/18 1515)     Initial Impression / Assessment and Plan / ED Course  I have reviewed the triage vital signs and the nursing notes.  Pertinent labs & imaging results that were available during my care of the patient were reviewed by me and considered in my medical decision making  (see chart for details).  Clinical Course as of Nov 12 1523  Sun Nov 11, 2018  1457 No fracture or dislocation, images reviewed by me  CT Cervical Spine Wo Contrast [EW]  3559 Small amount of left posterior subarachnoid hemorrhage around site of prior infarct, images reviewed by me  CT Head Wo Contrast [EW]  1506 Requested callback from neurosurgery regarding subarachnoid hemorrhage   [EW]  1521 Case discussed with neurosurgery, Dr. Sherley Bounds who is with hospitalization by hospitalist, repeat scan in the morning to look for progression of bleeding, and treat symptoms.   [EW]    Clinical Course User Index [EW] Daleen Bo, MD     Patient Vitals for the past 24 hrs:  BP Temp Temp src Pulse Resp SpO2  11/11/18 1500 (!) 207/91 - - 64 (!) 21 99 %  11/11/18 1430 (!) 169/84 - - 64 20 100 %  11/11/18 1415 (!) 221/93 - - 76 12 100 %  11/11/18 1400 (!) 206/102 - - 87 20 100 %  11/11/18 1345 (!) 207/94 - - 73 15 94 %  11/11/18 1330 (!) 224/99 - - 73 (!) 26 100 %  11/11/18 1315 (!) 219/131 - - 80 17 98 %  11/11/18 1300 (!) 224/101 - - 68 (!) 22 100 %  11/11/18 1258 (!) 220/90 98 F (36.7 C) Oral 67 16 98 %  11/11/18 1245 (!) 221/90 - - 69 15 100 %    3:05 PM Reevaluation with update and discussion. After initial assessment and treatment, an updated evaluation reveals patient is complaining headache at this time.  Findings discussed with patient and daughter, all questions were answered. Daleen Bo   Medical Decision Making: Mechanical fall with small focus of subarachnoid hemorrhage.  Mild hypertension, likely related to painful condition.  There is no indication for acute lowering blood pressure at this time.  Wound on forehead does not require suture closure.  Patient's daughter thinks that tetanus booster is up-to-date.  Patient is at risk for decompensation because she is on Plavix.  Therefore she will require hospitalization overnight for observation.  Plan to admit patient to  hospitalist service, following return of screening labs.  CRITICAL CARE-yes Performed by: Daleen Bo  Nursing Notes Reviewed/ Care Coordinated Applicable Imaging Reviewed Interpretation of Laboratory Data incorporated into ED treatment   Pain: Admit  Final Clinical Impressions(s) / ED Diagnoses   Final diagnoses:  Fall, initial encounter  Subarachnoid hemorrhage ([place])  Hypertension, unspecified type    ED Discharge Orders    None       Daleen Bo, MD 11/11/18 1526

## 2018-11-11 NOTE — ED Notes (Signed)
Admitting provider at bedside.

## 2018-11-11 NOTE — H&P (Signed)
History and Physical    Bethany Park IRS:[id number] DOB: 1927-06-13 DOA: 11/11/2018  PCP: Emeterio Reeve, DO  Patient coming from: Home  I have personally briefly reviewed patient's old medical records in [place]  Chief Complaint: [place] out of scooter now has small subarachnoid hemorrhage  HPI: Bethany Park is a 85 y.o. female with medical history significant of having recently moved here from [place] with a paucity of medical records who has a history of hypertension, coronary artery disease with 3 stents in 2 arteries in the past year, atrial fibrillation, and congestive heart failure who was walking her dog using her electric scooter when the dog pulled away and she fell out of the scooter.  She sustained a sizable hematoma in her left upper forehead and had no loss of consciousness.  On concrete.  Patient does not recall the incident and daughter states that she has some early dementia.  He has no other complaints from her fall today.  Evaluation in the ER revealed a small tiny subarachnoid hemorrhage.  She was seen by Dr. Sherley Bounds from neurosurgery who recommends monitoring with neuro checks overnight and repeat head CT in the a.m.  She is on Plavix and I have decided to hold it given the size of her hematoma on her forehead I am concerned that she could increased her intracranial bleeding.  Patient recently moved here because she has not been taking her medications while living out in [place].  Daughter reports that she was off them for about 3 months and recently has restarted them about 3 weeks ago.   Review of Systems: As per HPI otherwise all other systems reviewed and  negative.    Past Medical History:  Diagnosis Date  . Anemia   . Aortic stenosis   . Atrial fibrillation ([place])   . B12 deficiency 03/13/2018  . Colon polyp 09/2013   in [place].  for personal hx polyps and fm hx colon ca.  found small transverse polyp, diverticulosis, int  hemorrhoids.    . Dementia ([place])   . Depression with anxiety 03/13/2018  . Essential hypertension 03/13/2018  . Glucose intolerance 03/13/2018  . Heart attack ([place])   . Heart failure ([place])   . History of COPD   . History of coronary artery stent placement 09/2017  . Moderate aortic stenosis 03/13/2018  . Osteoporosis 03/13/2018  . Ovarian cancer ([place])   . Restless leg syndrome 03/13/2018  . Stroke [facility])     Past Surgical History:  Procedure Laterality Date  . ABDOMINAL HYSTERECTOMY    . BACK SURGERY    . BREAST SURGERY    . CHOLECYSTECTOMY    . CORONARY STENT PLACEMENT    . ESOPHAGOGASTRODUODENOSCOPY N/A 04/16/2018   Procedure: ESOPHAGOGASTRODUODENOSCOPY (EGD);  Surgeon: Ladene Artist, MD;  Location: [facility] ENDOSCOPY;  Service: Endoscopy;  Laterality: N/A;  . EYE SURGERY    . HEMORROIDECTOMY    . HOT HEMOSTASIS N/A 04/16/2018   Procedure: HOT HEMOSTASIS (ARGON PLASMA COAGULATION/BICAP);  Surgeon: Ladene Artist, MD;  Location: [facility] ENDOSCOPY;  Service: Endoscopy;  Laterality: N/A;  . JOINT REPLACEMENT      Social History   Social History Narrative  . Not on file     reports that she has never smoked. She has never used smokeless tobacco. She reports previous alcohol use. She reports previous drug use.  Allergies  Allergen Reactions  . Alendronate Sodium   . Benztropine Mesylate [Benztropine]   . Citalopram   .  Cymbalta [Duloxetine Hcl]   . Erythromycin   . Fluoxetine Hcl   . Glimepiride   . Nortriptyline Hcl   . Penicillins   . Sulfa Antibiotics   . Seroquel [Quetiapine Fumarate] Other (See Comments)    As per daughter, hallucinations.    Family History  Problem Relation Age of Onset  . Heart disease Mother   . Diabetes Mellitus II Mother   . Stroke Mother   . Colon cancer Father      Prior to Admission medications   Medication Sig Start Date End Date Taking? Authorizing Provider  albuterol (PROVENTIL HFA;VENTOLIN HFA) 108 (90 Base) MCG/ACT inhaler Inhale 2  puffs into the lungs every 6 (six) hours as needed for wheezing. 06/07/18  Yes Emeterio Reeve, DO  carvedilol (COREG) 3.125 MG tablet Take 1 tablet (3.125 mg total) by mouth 2 (two) times daily with a meal. 03/15/18  Yes Emeterio Reeve, DO  clopidogrel (PLAVIX) 75 MG tablet Take 1 tablet (75 mg total) by mouth daily. 04/18/18  Yes Lelon Perla, MD  famotidine (PEPCID) 40 MG tablet Take 1 tablet (40 mg total) by mouth daily. 04/19/18  Yes Shelly Coss, MD  HYDROcodone-acetaminophen (NORCO) 5-325 MG tablet Take 1 tablet by mouth every 6 (six) hours as needed for severe pain. #45 for 30 days 10/16/18  Yes Emeterio Reeve, DO  lisinopril (PRINIVIL,ZESTRIL) 2.5 MG tablet Take 1 tablet (2.5 mg total) by mouth daily. 03/15/18  Yes Emeterio Reeve, DO  lubiprostone (AMITIZA) 8 MCG capsule Take 1 capsule (8 mcg total) by mouth 2 (two) times daily with a meal. Start day after next bowel movement 09/17/18  Yes Emeterio Reeve, DO  Melatonin 10 MG SUBL Place 10 mg under the tongue at bedtime. 04/03/18  Yes Emeterio Reeve, DO  nystatin (MYCOSTATIN/NYSTOP) powder Apply topically 4 (four) times daily. 05/10/18  Yes Emeterio Reeve, DO  rOPINIRole (REQUIP) 3 MG tablet Take 1 tablet (3 mg total) by mouth 3 (three) times daily. 07/17/18  Yes Emeterio Reeve, DO  umeclidinium-vilanterol (ANORO ELLIPTA) 62.5-25 MCG/INH AEPB Inhale 1 puff into the lungs daily. 06/07/18  Yes Emeterio Reeve, DO  AMBULATORY NON [facility] bed with rails 04/26/18   Emeterio Reeve, DO  AMBULATORY NON [facility] MEDICATION Wheelchair 04/26/18   Emeterio Reeve, DO  amitriptyline (ELAVIL) 25 MG tablet Take 1 tablet (25 mg total) by mouth at bedtime. Patient not taking: Reported on 10/16/2018 04/26/18   Emeterio Reeve, DO  atorvastatin (LIPITOR) 40 MG tablet Take 1 tablet (40 mg total) by mouth daily. Patient not taking: Reported on 10/16/2018 03/15/18   Emeterio Reeve, DO  gabapentin  (NEURONTIN) 300 MG capsule Take 3 capsules (900 mg total) by mouth at bedtime. 06/07/18 09/05/18  Emeterio Reeve, DO  nitroGLYCERIN (NITROSTAT) 0.4 MG SL tablet Place 1 tablet (0.4 mg total) under the tongue every 5 (five) minutes as needed for chest pain. If no better 10-15 mins, go to hospital 03/27/18   Emeterio Reeve, DO    Physical Exam:  Constitutional: NAD, calm, comfortable Vitals:   11/11/18 1645 11/11/18 1700 11/11/18 1715 11/11/18 1730  BP: (!) 177/83 (!) 167/69 (!) 167/67 (!) 171/110  Pulse: 66 69 62 82  Resp: 20 14 19 15   Temp:      TempSrc:      SpO2: 100% 100% 97% 100%   Eyes: PERRL, lids and conjunctivae normal circumferential hematoma around the left eye ENMT: Mucous membranes are moist. Posterior pharynx clear of any exudate or lesions.Normal dentition.  Neck: normal,  supple, no masses, no thyromegaly Respiratory: clear to auscultation bilaterally, no wheezing, no crackles. Normal respiratory effort. No accessory muscle use.  Cardiovascular: Regular rate and rhythm, 3/6 murmur; no rubs / gallops. No extremity edema. 2+ pedal pulses. No carotid bruits.  Abdomen: no tenderness, no masses palpated. No hepatosplenomegaly. Bowel sounds positive.  Musculoskeletal: no clubbing / cyanosis. No joint deformity upper and lower extremities. Good ROM, no contractures. Normal muscle tone.  Skin: no rashes, lesions, ulcers. No induration Neurologic: CN 2-12 grossly intact. Sensation intact, DTR normal. Strength 5/5 in all 4.  Psychiatric: Altered judgment and poor insight insight. Alert and oriented x name,  Year,  Month, but not day date or situation. Normal mood.    Labs on Admission: I have personally reviewed following labs and imaging studies  CBC: Recent Labs  Lab 11/11/18 1554  WBC 8.5  NEUTROABS 7.1  HGB 10.9*  HCT 33.2*  MCV 98.2  PLT 716   Basic Metabolic Panel: Recent Labs  Lab 11/11/18 1554  NA 133*  K 4.0  CL 101  CO2 23  GLUCOSE 126*  BUN 25*    CREATININE 1.05*  CALCIUM 9.5   Urine analysis:    Component Value Date/Time   COLORURINE STRAW (A) 04/14/2018 [place] 04/14/2018 2345   LABSPEC 1.011 04/14/2018 2345   PHURINE 5.0 04/14/2018 2345   GLUCOSEU NEGATIVE 04/14/2018 2345   HGBUR SMALL (A) 04/14/2018 2345   BILIRUBINUR negative 09/24/2018 1745   KETONESUR negative 09/24/2018 1745   KETONESUR NEGATIVE 04/14/2018 2345   PROTEINUR negative 09/24/2018 1745   PROTEINUR NEGATIVE 04/14/2018 2345   UROBILINOGEN 0.2 09/24/2018 1745   NITRITE Negative 09/24/2018 1745   NITRITE NEGATIVE 04/14/2018 2345   LEUKOCYTESUR Small (1+) (A) 09/24/2018 1745    Radiological Exams on Admission: Ct Head Wo Contrast  Result Date: 11/11/2018 CLINICAL DATA:  Fall.  Left forehead hematoma. EXAM: CT HEAD WITHOUT CONTRAST CT CERVICAL SPINE WITHOUT CONTRAST TECHNIQUE: Multidetector CT imaging of the head and cervical spine was performed following the standard protocol without intravenous contrast. Multiplanar CT image reconstructions of the cervical spine were also generated. COMPARISON:  CT head dated April 15, 2018.  The lungs are FINDINGS: CT HEAD FINDINGS Brain: New trace subarachnoid blood in the left occipital lobe adjacent to the old left parieto-occipital infarct. There is also a small amount of trace subarachnoid blood in the posterior left cerebellar hemisphere. No evidence of acute infarction, hydrocephalus, extra-axial collection or mass lesion/mass effect. Stable mild atrophy and chronic microvascular ischemic changes. Vascular: Calcified atherosclerosis at the skullbase. No hyperdense vessel. Skull: Negative for fracture or focal lesion. Sinuses/Orbits: No acute finding. Chronic right maxillary sinusitis. Other: Large left frontal scalp hematoma.  Old nasal bone fractures. CT CERVICAL SPINE FINDINGS Alignment: No traumatic malalignment. Reversal of the normal cervical lordosis at C4-C5. 2 mm facet mediated anterolisthesis at  C3-C4. Skull base and vertebrae: No acute fracture. No primary bone lesion or focal pathologic process. 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 Soft tissues and spinal canal: No prevertebral fluid or swelling. No visible canal hematoma. Disc levels: Multilevel degenerative disc disease of the cervical spine, moderate at C5-C6. Scattered mild to moderate bilateral facet arthropathy. Upper chest: Negative. Other: None. IMPRESSION: CT head: 1. Trace subarachnoid hemorrhage in the left occipital lobe and posterior left cerebellar hemisphere. 2. Large left frontal scalp hematoma. 3. 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 CT cervical spine: 1.  No acute cervical spine fracture. 2. Mild-to-moderate cervical spondylosis. Critical  Value/emergent results were called by telephone at the time of interpretation on 11/11/2018 at 2:58 pm to Dr. Daleen Bo , who verbally acknowledged these results. Electronically Signed   By: Titus Dubin M.D.   On: 11/11/2018 15:01   Ct Cervical Spine Wo Contrast  Result Date: 11/11/2018 CLINICAL DATA:  Fall.  Left forehead hematoma. EXAM: CT HEAD WITHOUT CONTRAST CT CERVICAL SPINE WITHOUT CONTRAST TECHNIQUE: Multidetector CT imaging of the head and cervical spine was performed following the standard protocol without intravenous contrast. Multiplanar CT image reconstructions of the cervical spine were also generated. COMPARISON:  CT head dated April 15, 2018.  The lungs are FINDINGS: CT HEAD FINDINGS Brain: New trace subarachnoid blood in the left occipital lobe adjacent to the old left parieto-occipital infarct. There is also a small amount of trace subarachnoid blood in the posterior left cerebellar hemisphere. No evidence of acute infarction, hydrocephalus, extra-axial collection or mass lesion/mass effect. Stable mild atrophy and chronic microvascular ischemic changes. Vascular: Calcified atherosclerosis at the skullbase. No hyperdense vessel. Skull: Negative for  fracture or focal lesion. Sinuses/Orbits: No acute finding. Chronic right maxillary sinusitis. Other: Large left frontal scalp hematoma.  Old nasal bone fractures. CT CERVICAL SPINE FINDINGS Alignment: No traumatic malalignment. Reversal of the normal cervical lordosis at C4-C5. 2 mm facet mediated anterolisthesis at C3-C4. Skull base and vertebrae: No acute fracture. No primary bone lesion or focal pathologic process. 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 Soft tissues and spinal canal: No prevertebral fluid or swelling. No visible canal hematoma. Disc levels: Multilevel degenerative disc disease of the cervical spine, moderate at C5-C6. Scattered mild to moderate bilateral facet arthropathy. Upper chest: Negative. Other: None. IMPRESSION: CT head: 1. Trace subarachnoid hemorrhage in the left occipital lobe and posterior left cerebellar hemisphere. 2. Large left frontal scalp hematoma. 3. 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 CT cervical spine: 1.  No acute cervical spine fracture. 2. Mild-to-moderate cervical spondylosis. Critical Value/emergent results were called by telephone at the time of interpretation on 11/11/2018 at 2:58 pm to Dr. Daleen Bo , who verbally acknowledged these results. Electronically Signed   By: Titus Dubin M.D.   On: 11/11/2018 15:01    EKG: Ordered and pending  Assessment/Plan Principal Problem:   Subarachnoid hemorrhage (HCC) Active Problems:   Atrial fibrillation (HCC)   History of coronary artery stent placement   Moderate aortic stenosis   Dementia (HCC)   Ambulatory dysfunction   CAD (coronary artery disease)   Acute renal failure superimposed on stage 3 chronic kidney disease (HCC)   Diabetic macular edema (HCC)   Essential hypertension   ASCVD (arteriosclerotic cardiovascular disease)   Charcot-Marie-Tooth disease   Chronic low back pain   1.  Tiny subarachnoid hemorrhage: Plan is for neuro checks overnight repeat head CT in a.m.  2.   Atrial fibrillation: Anticoagulated.  Rate controlled by pulse.  3.  History of coronary artery disease with stent placement: This has been within the past 1 year when she was out in [place].  She is on Plavix which we will hold for 10 days.  Suggested to family that she resume the Plavix on the 22nd.  4.  Moderate aortic stenosis: Noted patient with significant murmur on exam  5.  Mild dementia: Noted.  Continue supportive care.  6.  History of acute renal failure superimposed on stage III chronic kidney disease: Currently patient with a GFR of 47 and a creatinine of 1.05 we will continue to monitor avoid nephrotoxic agents.  7.  Diabetic macular edema with diabetes: Noted continue home medication management.  8.  Charcot-Marie-Tooth disease: Likely related to diabetes monitor continue home plan.  9.  Chronic low back pain: Continue home medication regimen.  DVT prophylaxis: SCDs Code Status: DO NOT RESUSCITATE Family Communication: Spoke with patient's 2 daughters were present at the time of admission. Disposition Plan: Likely home versus rehab depending on progress Consults called: Dr. Ronnald Ramp neurosurgery Admission status: Observation   Lady Deutscher MD [facility] Pager [phone]  If 7PM-7AM, please contact night-coverage [email or web address] Password [facility]  11/11/2018, 6:15 PM

## 2018-11-11 NOTE — ED Notes (Signed)
Neurosurgeon at bedside

## 2018-11-12 ENCOUNTER — Observation Stay (HOSPITAL_COMMUNITY): Payer: Medicare Other

## 2018-11-12 DIAGNOSIS — S066X9A Traumatic subarachnoid hemorrhage with loss of consciousness of unspecified duration, initial encounter: Secondary | ICD-10-CM | POA: Diagnosis not present

## 2018-11-12 DIAGNOSIS — I609 Nontraumatic subarachnoid hemorrhage, unspecified: Secondary | ICD-10-CM | POA: Diagnosis not present

## 2018-11-12 LAB — CBC
HCT: 27.4 % — ABNORMAL LOW (ref 36.0–46.0)
Hemoglobin: 9.2 g/dL — ABNORMAL LOW (ref 12.0–15.0)
MCH: 32.6 pg (ref 26.0–34.0)
MCHC: 33.6 g/dL (ref 30.0–36.0)
MCV: 97.2 fL (ref 80.0–100.0)
NRBC: 0 % (ref 0.0–0.2)
Platelets: 197 10*3/uL (ref 150–400)
RBC: 2.82 MIL/uL — ABNORMAL LOW (ref 3.87–5.11)
RDW: 12.8 % (ref 11.5–15.5)
WBC: 6.6 10*3/uL (ref 4.0–10.5)

## 2018-11-12 NOTE — Plan of Care (Signed)
  Problem: Spiritual Needs Goal: Ability to function at adequate level 11/12/2018 1003 by Jeanne Ivan, RN Outcome: Progressing 11/12/2018 1002 by Jeanne Ivan, RN Outcome: Progressing   Problem: Education: Goal: Knowledge of General Education information will improve Description Including pain rating scale, medication(s)/side effects and non-pharmacologic comfort measures Outcome: Progressing   Problem: Health Behavior/Discharge Planning: Goal: Ability to manage health-related needs will improve Outcome: Progressing   Problem: Clinical Measurements: Goal: Ability to maintain clinical measurements within normal limits will improve Outcome: Progressing Goal: Will remain free from infection Outcome: Progressing Goal: Diagnostic test results will improve Outcome: Progressing Goal: Respiratory complications will improve Outcome: Progressing Goal: Cardiovascular complication will be avoided Outcome: Progressing   Problem: Activity: Goal: Risk for activity intolerance will decrease Outcome: Progressing   Problem: Nutrition: Goal: Adequate nutrition will be maintained Outcome: Progressing   Problem: Coping: Goal: Level of anxiety will decrease Outcome: Progressing   Problem: Elimination: Goal: Will not experience complications related to bowel motility Outcome: Progressing Goal: Will not experience complications related to urinary retention Outcome: Progressing   Problem: Pain Managment: Goal: General experience of comfort will improve Outcome: Progressing   Problem: Safety: Goal: Ability to remain free from injury will improve Outcome: Progressing   Problem: Skin Integrity: Goal: Risk for impaired skin integrity will decrease Outcome: Progressing

## 2018-11-12 NOTE — Evaluation (Signed)
Physical Therapy Evaluation Patient Details Name: Yamile Roedl MRN: [id number] DOB: 02-Dec-1926 Today's Date: 11/12/2018   History of Present Illness  Elanah Osmanovic is a 85 y.o. female with medical history significant of having recently moved here from [place] who has a history of hypertension, coronary artery disease with 3 stents in 2 arteries in the past year, atrial fibrillation, and congestive heart failure who was walking her dog using her electric scooter when the dog pulled away and she fell out of the scooter.  She sustained a sizable hematoma in her left upper forehead and had loss of consciousness. Per daughter pt with early signs of dementia.  Clinical Impression  Pt admitted with above. Pt c/o dizziness and nausea, improved s/p BM. Pt attempted to amb without RW stating "I can't use it because I"m so dizzy" however pt was very unsteady requiring modA for safe mobility. Pt able to tolerate ambulation with RW with minA with minimal dizziness and nausea. Pt needs 24/7 assist for safe d/c home as pt at a significantly high falls risk.    Follow Up Recommendations Home health PT;Supervision/Assistance - 24 hour    Equipment Recommendations  Rolling walker with 5" wheels    Recommendations for Other Services       Precautions / Restrictions Precautions Precautions: Fall Precaution Comments: large bruise/hematoma on L side of forehead Restrictions Weight Bearing Restrictions: No      Mobility  Bed Mobility Overal bed mobility: Needs Assistance Bed Mobility: Supine to Sit     Supine to sit: Min assist     General bed mobility comments: minA for trunk elevation, pt brought LEs off EOB and used bed rail to pull up from   Transfers Overall transfer level: Needs assistance Equipment used: Rolling walker (2 wheeled);None Transfers: Sit to/from Stand Sit to Stand: Min assist         General transfer comment: pt amxious re: needing to go to the bathroom making her  slightly impulsive, increased time, minA to power up and steady during transition of hands from bed to walker  Ambulation/Gait Ambulation/Gait assistance: Min assist Gait Distance (Feet): 120 Feet Assistive device: Rolling walker (2 wheeled) Gait Pattern/deviations: Step-through pattern;Decreased stride length;Trunk flexed Gait velocity: decreased Gait velocity interpretation: <1.31 ft/sec, indicative of household ambulator General Gait Details: minA for walker management, max verbal cues to stay in walker instead of pushing to far out in front,  Stairs            Wheelchair Mobility    Modified Rankin (Stroke Patients Only)       Balance Overall balance assessment: Needs assistance Sitting-balance support: Feet supported;Bilateral upper extremity supported Sitting balance-Leahy Scale: Fair     Standing balance support: Single extremity supported Standing balance-Leahy Scale: Poor Standing balance comment: pt dependent on UE support for safe standing, pt washed hands at sink and leaned up against counter                             Pertinent Vitals/Pain Pain Assessment: No/denies pain(c/o sick to my stomach)    Home Living Family/patient expects to be discharged to:: Private residence Living Arrangements: Children Available Help at Discharge: Family;Available 24 hours/day Type of Home: House Home Access: Stairs to enter Entrance Stairs-Rails: None Entrance Stairs-Number of Steps: 1 Home Layout: One level Home Equipment: Walker - 4 wheels;Shower seat;Bedside commode;Grab bars - tub/shower;Electric scooter      Prior Function Level of Independence: Needs assistance  Gait / Transfers Assistance Needed: uses electric scooter outside, RW inside, assist on the stairs  ADL's / Homemaking Assistance Needed: pt was bathing/dressing with supervision, pt doens't cook or meal prep        Hand Dominance   Dominant Hand: Right    Extremity/Trunk  Assessment   Upper Extremity Assessment Upper Extremity Assessment: Generalized weakness    Lower Extremity Assessment Lower Extremity Assessment: Generalized weakness    Cervical / Trunk Assessment Cervical / Trunk Assessment: Kyphotic  Communication   Communication: No difficulties  Cognition Arousal/Alertness: Awake/alert Behavior During Therapy: WFL for tasks assessed/performed Overall Cognitive Status: No family/caregiver present to determine baseline cognitive functioning                                 General Comments: pt appears appropriate, mildly tangential with conversation, mildly anxious regarding having to have a BM      General Comments General comments (skin integrity, edema, etc.): pt with large bruise/hematoma on L forehead, L black eye, L hand abrasions, pt assisted to the bathroom, pt required minA for hygiene for safety with standing and performing hygiene    Exercises     Assessment/Plan    PT Assessment Patient needs continued PT services  PT Problem List Decreased strength;Decreased range of motion;Decreased activity tolerance;Decreased balance;Decreased mobility;Decreased coordination;Decreased cognition;Decreased knowledge of use of DME;Decreased safety awareness       PT Treatment Interventions DME instruction;Gait training;Stair training;Functional mobility training;Therapeutic activities;Therapeutic exercise;Balance training;Neuromuscular re-education    PT Goals (Current goals can be found in the Care Plan section)  Acute Rehab PT Goals Patient Stated Goal: go home PT Goal Formulation: With patient Time For Goal Achievement: 11/26/18 Potential to Achieve Goals: Good    Frequency Min 3X/week   Barriers to discharge        Co-evaluation               AM-PAC PT "6 Clicks" Mobility  Outcome Measure Help needed turning from your back to your side while in a flat bed without using bedrails?: A Little Help needed moving  from lying on your back to sitting on the side of a flat bed without using bedrails?: A Little Help needed moving to and from a bed to a chair (including a wheelchair)?: A Little Help needed standing up from a chair using your arms (e.g., wheelchair or bedside chair)?: A Little Help needed to walk in hospital room?: A Little Help needed climbing 3-5 steps with a railing? : A Lot 6 Click Score: 17    End of Session Equipment Utilized During Treatment: Gait belt Activity Tolerance: Patient tolerated treatment well Patient left: in chair;with call bell/phone within reach;with chair alarm set Nurse Communication: Mobility status PT Visit Diagnosis: Unsteadiness on feet (R26.81)    Time: 3299-2426 PT Time Calculation (min) (ACUTE ONLY): 26 min   Charges:   PT Evaluation $PT Eval Moderate Complexity: 1 Mod PT Treatments $Gait Training: 8-22 mins        Kittie Plater, PT, DPT Acute Rehabilitation Services Pager #: [phone] Office #: [phone]   Berline Lopes 11/12/2018, 12:05 PM

## 2018-11-12 NOTE — Progress Notes (Signed)
   11/12/18 1100  Clinical Encounter Type  Visited With Patient  Visit Type Initial  Responded to [facility] consult for prayer. Patient need to go to bathroom. Informed nurse I will try to come back and if not just put in another consult request.

## 2018-11-12 NOTE — Discharge Instructions (Signed)
Fall Prevention in Hospitals, Adult Being a patient in the hospital puts you at risk for falling. Falls can cause serious injury and harm, but they can be prevented. It is important to understand what puts you at risk for falling and what you and your health care team can do to prevent you from falling. If you or a loved one falls at the hospital, it is important to tell hospital staff about it. What increases the risk for falls? Certain conditions and treatments may increase your risk of falling in the hospital. These include:  Being in an unfamiliar environment, especially when using the bathroom at night.  Having surgery.  Being on bed rest.  Taking many medicines or certain types of medicines, such as sleeping pills.  Having tubes in place, such as IV lines or catheters. Other risk factors for falls in a hospital include:  Having difficulty with hearing or vision.  Having a change in thinking or behavior, such as confusion.  Having depression.  Having trouble with balance.  Being a female.  Feeling dizzy.  Needing to use the toilet frequently.  Having fallen during the past three months.  Having low blood pressure. What are some strategies for preventing falls? If you or a loved one has to stay in the hospital:  Ask about which fall prevention strategies will be in place. Do not hesitate to speak up if you notice that the fall prevention plan has changed.  Ask for help moving around, especially after surgery or when feeling unwell.  If you have been asked to call for help when getting up, do not get up by yourself. Asking for help with getting up is for your safety, and the staff is there to help you.  Wear nonskid footwear.  Get up slowly, and sit at the side of the bed for a few minutes before standing up.  Keep items you need, such as the nurse call button or a phone, close to you so that you do not need to reach for them.  Wear eyeglasses or hearing aids if you  have them.  Have someone stay in the hospital with you or your loved one.  Ask if sleeping pills or other medicines that can cause confusion are necessary. What does the hospital staff do to help prevent falls? Hospitals have systems in place to prevent falls and accidents, which may involve:  Discussing your fall risks and making a personalized fall prevention plan.  Checking in regularly to see if you need help.  Placing an arm band on your wrist or a sign near your room to alert other staff of your needs.  Using an alarm on your hospital bed. This is an alarm that goes off if you get out of bed and forget to call for help.  Keeping the bed in a low and locked position.  Keeping the area around the bed and bathroom well-lit and free from clutter.  Keeping your room quiet, so that you can sleep and be well-rested.  Using safety equipment, such as: ? A belt around your waist. ? Walkers, crutches, and other devices for support. ? Safety beds, such as low beds or cushions on the floor next to the bed.  Having a staff person stay with you (one-on-one observation), even when you are using the bathroom. This is for your safety.  Using video monitoring. This allows a staff member to come to help you if you need help. What other actions can I take to  lower my risk of falls?  Check in regularly with your health care provider or pharmacist to review all of the medicines that you take.  Make sure that you have a regular exercise program to stay fit. This will help you maintain your balance.  Talk with a physical therapist or trainer if recommended by your health care provider. They can help you to improve your strength, balance, and endurance.  If you are over age 83: ? Ask your health care provider if you need a calcium or vitamin D supplement. ? Have your eyes and hearing checked every year. ? Have your feet checked every year. Where to find more information You can find more  information about fall prevention from the Centers for Disease Control and Prevention: ImproveLook.cz Summary  Being in an unfamiliar environment, such as the hospital, increases your risk for falling.  If you have been asked to call for help when getting up, do not get up by yourself. Asking for help with getting up is for your safety, and the staff is there to help you.  Ask about which fall prevention strategies will be in place. Do not hesitate to speak up if you notice that the fall prevention plan has changed.  If you or a loved one falls, tell the hospital staff. This is important. This information is not intended to replace advice given to you by your health care provider. Make sure you discuss any questions you have with your health care provider. Document Released: 10/14/2000 Document Revised: 05/31/2017 Document Reviewed: 05/31/2017 Elsevier Interactive Patient Education  2019 [employer].

## 2018-11-12 NOTE — Progress Notes (Signed)
Patient received from ER to room 4N13 at 2030. Transferred to bed and positioned for comfort. Initial admission assessment completed with daughter/POA/legal guardian at bedside. Oriented to room, bed and unit.

## 2018-11-12 NOTE — Plan of Care (Signed)
  Problem: Spiritual Needs Goal: Ability to function at adequate level Outcome: Progressing

## 2018-11-12 NOTE — Progress Notes (Signed)
Discharge instructions (including medications) discussed with and copy provided to patient/caregiver. Given and reviewed with Bethany Park daughter.

## 2018-11-12 NOTE — Care Management Note (Signed)
Case Management Note Cross Coverage for 4NP Marvetta Gibbons [facility] [phone]  Patient Details  Name: Bethany Park MRN: [id number] Date of Birth: 02-13-1927  Subjective/Objective:  Pt presented with [facility]                  Action/Plan: PTA pt lived at home, Per therapy evals recommendations for [facility] services- orders placed for HHRN/PT/OT/aide- CM spoke with pt and daughter at the bedside for transition of care needs- choice offered with list provided Per CMS website with star ratings (copy placed in shadow chart)- per daughter pt has had [facility] in past and would she would like to use them again. Pt has w/c at home however would like RW for home- order has been placed for DME- call made to [facility] with [facility] for DME need- RW to be delivered to room prior to discharge. Call also made to [facility] with [facility] for [facility] needs- RN/PT/OT- (daughter has declined aide)- referral accepted. Address, phone # and PCP all confirmed in epic by CM.   Expected Discharge Date:     11/12/18             Expected Discharge Plan:  [place]  In-House Referral:  NA  Discharge planning Services  CM Consult  Post Acute Care Choice:  Durable Medical Equipment, Home Health Choice offered to:  Patient, Adult Children  DME Arranged:  Walker rolling DME Agency:  [place] Arranged:  RN, PT, OT, Nurse's Aide [place] Agency:  [place]  Status of Service:  Completed, signed off  If discussed at [place] of Stay Meetings, dates discussed:    Discharge Disposition: home/home health   Additional Comments:  Dawayne Patricia, RN 11/12/2018, 3:47 PM

## 2018-11-12 NOTE — Discharge Summary (Signed)
Physician Discharge Summary  Bethany Park YKD:[id number] DOB: January 01, 1927 DOA: 11/11/2018  PCP: Emeterio Reeve, DO  Admit date: 11/11/2018 Discharge date: 11/12/2018  Admitted From: Home Disposition: Home with home health  Recommendations for Outpatient Follow-up:  1. Follow up with PCP in 1-2 weeks 2. Please obtain BMP/CBC in one week your next doctors visit.  3. Hold Plavix for 5-7 days 4. 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  She ended up sustaining left upper forehead injury with sizable hematoma.  CT of the head showed small subarachnoid hemorrhage.  Neurosurgery was consulted who recommended holding off on Plavix for at least 3-5 days and following up outpatient.  He also recommended repeating CT at the following day which showed improvement in her hemorrhage. In the morning patient was eval by physical therapy recommended home health therefore arrangements were made. I spoke extensively with the patient's daughter at bedside.  Patient is reconnection benefit from hospital stay and stable for discharge with outpatient follow-up recommendations as stated above.   Discharge Diagnoses:  Principal Problem:   Subarachnoid hemorrhage ([place]) Active Problems:   Diabetic macular edema (HCC)   Atrial fibrillation (HCC)   Essential hypertension   History of coronary artery stent placement   ASCVD (arteriosclerotic cardiovascular disease)   Moderate aortic stenosis   Dementia (HCC)   Charcot-Marie-Tooth disease   Ambulatory dysfunction   CAD (coronary artery disease)   Acute renal failure superimposed on stage 3 chronic kidney disease (HCC)    Chronic low back pain  Small subarachnoid hemorrhage -Appreciate neurosurgery input, repeat CT head this morning shows improvement in subarachnoid hemorrhage.  Hold Plavix for at least 5 days and follow-up outpatient with primary care provider. -PT-recommended home health therefore arrangements made.  History of atrial fibrillation, persistent -Rate controlled.  Advised to hold Plavix.  Not on any long-term anticoagulation due to risk of bleeding.  Coronary artery disease status post stent -Hold Plavix.  Currently chest pain-free.  Resume home medication including Coreg, statin, lisinopril  Essential hypertension -Resume Coreg, lisinopril.  Ambulatory dysfunction and weakness -Secondary to advanced age and recent fall.  Would benefit from home health.  Patient on SCDs while here She is DNR/DNI Spoke with the patient's daughter at bedside on the day of discharge.  Discharge Instructions   Allergies as of 11/12/2018      Reactions   Alendronate Sodium    Benztropine Mesylate [benztropine]    Citalopram    Cymbalta [duloxetine Hcl]    Erythromycin    Fluoxetine Hcl    Glimepiride    Nortriptyline Hcl    Penicillins    Sulfa Antibiotics    Seroquel [quetiapine Fumarate] Other (See Comments)   As per daughter, hallucinations.      Medication List    TAKE these medications   albuterol 108 (90 Base) MCG/ACT inhaler Commonly known as:  PROVENTIL HFA;VENTOLIN HFA Inhale 2 puffs into the lungs every 6 (six) hours as needed for wheezing.   [place] bed with rails   AMBULATORY NON FORMULARY MEDICATION Wheelchair   amitriptyline 25 MG tablet Commonly known as:  ELAVIL Take 1 tablet (25 mg total) by mouth at bedtime.   atorvastatin 40 MG tablet Commonly known as:  LIPITOR  Take 1 tablet (40 mg total) by mouth daily.   carvedilol 3.125 MG tablet Commonly known as:  COREG Take 1 tablet (3.125 mg total) by mouth 2 (two) times daily with  a meal.   clopidogrel 75 MG tablet Commonly known as:  PLAVIX Take 1 tablet (75 mg total) by mouth daily.   famotidine 40 MG tablet Commonly known as:  PEPCID Take 1 tablet (40 mg total) by mouth daily.   gabapentin 300 MG capsule Commonly known as:  NEURONTIN Take 3 capsules (900 mg total) by mouth at bedtime.   HYDROcodone-acetaminophen 5-325 MG tablet Commonly known as:  NORCO Take 1 tablet by mouth every 6 (six) hours as needed for severe pain. #45 for 30 days   lisinopril 2.5 MG tablet Commonly known as:  PRINIVIL,ZESTRIL Take 1 tablet (2.5 mg total) by mouth daily.   lubiprostone 8 MCG capsule Commonly known as:  AMITIZA Take 1 capsule (8 mcg total) by mouth 2 (two) times daily with a meal. Start day after next bowel movement   Melatonin 10 MG Subl Place 10 mg under the tongue at bedtime.   nitroGLYCERIN 0.4 MG SL tablet Commonly known as:  NITROSTAT Place 1 tablet (0.4 mg total) under the tongue every 5 (five) minutes as needed for chest pain. If no better 10-15 mins, go to hospital   nystatin powder Commonly known as:  MYCOSTATIN/NYSTOP Apply topically 4 (four) times daily.   rOPINIRole 3 MG tablet Commonly known as:  REQUIP Take 1 tablet (3 mg total) by mouth 3 (three) times daily.   umeclidinium-vilanterol 62.5-25 MCG/INH Aepb Commonly known as:  ANORO ELLIPTA Inhale 1 puff into the lungs daily.            Durable Medical Equipment  (From admission, onward)         Start     Ordered   11/12/18 1504  For home use only DME Walker rolling  Once    Question Answer Comment  Patient needs a walker to treat with the following condition Ambulatory dysfunction   Patient needs a walker to treat with the following condition Generalized weakness      11/12/18 1503         Follow-up Information    Emeterio Reeve, DO. Schedule an appointment as soon as possible for a visit in 1 week(s).   Specialty:  Osteopathic Medicine Contact information: [address] [place] [address] [place] Follow up.   Why:  rolling walker arranged- to be delivered to room prior to discharge Contact information: [address] [place] [address] [phone]        [facility].   Specialty:  [place] Why:  HHRN/PT/OT arranged- they will call to set up home visits Contact information: [place] [address] [phone]          Allergies  Allergen Reactions  . Alendronate Sodium   . Benztropine Mesylate [Benztropine]   . Citalopram   . Cymbalta [Duloxetine Hcl]   . Erythromycin   . Fluoxetine Hcl   . Glimepiride   . Nortriptyline Hcl   . Penicillins   . Sulfa Antibiotics   . Seroquel [Quetiapine Fumarate] Other (See Comments)    As per daughter, hallucinations.    You were cared for by a hospitalist during your hospital stay. If you have any questions about your  discharge medications or the care you received while you were in the hospital after you are discharged, you can call the unit and asked to speak with the hospitalist on call if the hospitalist that took care of you is not available. Once you are discharged, your primary care physician will handle any further medical issues. Please note that no refills for any discharge medications will be authorized once you are discharged, as it is imperative that you return to your primary care physician (or establish a relationship with a primary care physician if you do not have one) for your aftercare needs so that they can reassess your need for medications and monitor your lab values.  Consultations:  Neurosurgery   Procedures/Studies: Ct Head Wo Contrast  Result Date: 11/12/2018 CLINICAL DATA:  The patient suffered a small subarachnoid hemorrhage yesterday due to a fall. Initial encounter. EXAM: CT HEAD WITHOUT CONTRAST TECHNIQUE: Contiguous axial  images were obtained from the base of the skull through the vertex without intravenous contrast. COMPARISON:  Head CT scan 04/15/2018 and 11/11/2018. FINDINGS: Brain: Small focus of hyperattenuation in the posterior left occipital lobe seen on yesterday's examination is slightly smaller. No new hemorrhage is identified. No hydrocephalus, intraventricular hemorrhage, mass midline shift. Atrophy, chronic microvascular ischemic change and remote left PCA infarct are noted. Vascular: No hyperdense vessel or unexpected calcification. Skull: Intact. Sinuses/Orbits: The patient is status post maxillary antrostomy. Minimal mucosal thickening right maxillary sinus noted. Other: Scalp hematoma on the left is seen as on yesterday's exam. IMPRESSION: Small subarachnoid hemorrhage in the posterior left occipital lobe has slightly decreased since yesterday's examination. No new abnormality. Atrophy, chronic microvascular ischemic change and remote left PCA infarct. Electronically Signed   By: Inge Rise M.D.   On: 11/12/2018 11:51   Ct Head Wo Contrast  Result Date: 11/11/2018 CLINICAL DATA:  Fall.  Left forehead hematoma. EXAM: CT HEAD WITHOUT CONTRAST CT CERVICAL SPINE WITHOUT CONTRAST TECHNIQUE: Multidetector CT imaging of the head and cervical spine was performed following the standard protocol without intravenous contrast. Multiplanar CT image reconstructions of the cervical spine were also generated. COMPARISON:  CT head dated April 15, 2018.  The lungs are FINDINGS: CT HEAD FINDINGS Brain: New trace subarachnoid blood in the left occipital lobe adjacent to the old left parieto-occipital infarct. There is also a small amount of trace subarachnoid blood in the posterior left cerebellar hemisphere. No evidence of acute infarction, hydrocephalus, extra-axial collection or mass lesion/mass effect. Stable mild atrophy and chronic microvascular ischemic changes. Vascular: Calcified atherosclerosis at the skullbase. No  hyperdense vessel. Skull: Negative for fracture or focal lesion. Sinuses/Orbits: No acute finding. Chronic right maxillary sinusitis. Other: Large left frontal scalp hematoma.  Old nasal bone fractures. CT CERVICAL SPINE FINDINGS Alignment: No traumatic malalignment. Reversal of the normal cervical lordosis at C4-C5. 2 mm facet mediated anterolisthesis at C3-C4. Skull base and vertebrae: No acute fracture. No primary bone lesion or focal pathologic process. 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 Soft tissues and spinal canal: No prevertebral fluid or swelling. No visible canal hematoma. Disc levels: Multilevel degenerative disc disease of the cervical spine, moderate at C5-C6. Scattered mild to moderate bilateral facet arthropathy. Upper chest: Negative. Other: None. IMPRESSION: CT head: 1. Trace subarachnoid hemorrhage in the left occipital lobe and posterior left cerebellar hemisphere. 2. Large left frontal scalp hematoma. 3. 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 CT cervical spine: 1.  No acute cervical spine fracture. 2. Mild-to-moderate cervical spondylosis. Critical Value/emergent results were called by  telephone at the time of interpretation on 11/11/2018 at 2:58 pm to Dr. Daleen Bo , who verbally acknowledged these results. Electronically Signed   By: Titus Dubin M.D.   On: 11/11/2018 15:01   Ct Cervical Spine Wo Contrast  Result Date: 11/11/2018 CLINICAL DATA:  Fall.  Left forehead hematoma. EXAM: CT HEAD WITHOUT CONTRAST CT CERVICAL SPINE WITHOUT CONTRAST TECHNIQUE: Multidetector CT imaging of the head and cervical spine was performed following the standard protocol without intravenous contrast. Multiplanar CT image reconstructions of the cervical spine were also generated. COMPARISON:  CT head dated April 15, 2018.  The lungs are FINDINGS: CT HEAD FINDINGS Brain: New trace subarachnoid blood in the left occipital lobe adjacent to the old left parieto-occipital infarct. There is  also a small amount of trace subarachnoid blood in the posterior left cerebellar hemisphere. No evidence of acute infarction, hydrocephalus, extra-axial collection or mass lesion/mass effect. Stable mild atrophy and chronic microvascular ischemic changes. Vascular: Calcified atherosclerosis at the skullbase. No hyperdense vessel. Skull: Negative for fracture or focal lesion. Sinuses/Orbits: No acute finding. Chronic right maxillary sinusitis. Other: Large left frontal scalp hematoma.  Old nasal bone fractures. CT CERVICAL SPINE FINDINGS Alignment: No traumatic malalignment. Reversal of the normal cervical lordosis at C4-C5. 2 mm facet mediated anterolisthesis at C3-C4. Skull base and vertebrae: No acute fracture. No primary bone lesion or focal pathologic process. 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 Soft tissues and spinal canal: No prevertebral fluid or swelling. No visible canal hematoma. Disc levels: Multilevel degenerative disc disease of the cervical spine, moderate at C5-C6. Scattered mild to moderate bilateral facet arthropathy. Upper chest: Negative. Other: None. IMPRESSION: CT head: 1. Trace subarachnoid hemorrhage in the left occipital lobe and posterior left cerebellar hemisphere. 2. Large left frontal scalp hematoma. 3. 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 CT cervical spine: 1.  No acute cervical spine fracture. 2. Mild-to-moderate cervical spondylosis. Critical Value/emergent results were called by telephone at the time of interpretation on 11/11/2018 at 2:58 pm to Dr. Daleen Bo , who verbally acknowledged these results. Electronically Signed   By: Titus Dubin M.D.   On: 11/11/2018 15:01      Subjective: Patient feels better except slight headache.  Repeat CT head this morning shows improvement in her subarachnoid hemorrhage.   Discharge Exam: Vitals:   11/12/18 1201 11/12/18 1534  BP:  131/68  Pulse: 69 68  Resp: 18 (!) 30  Temp:  98.4 F (36.9 C)  SpO2: 99%  100%   Vitals:   11/12/18 1005 11/12/18 1114 11/12/18 1201 11/12/18 1534  BP: (!) 124/58 (!) 168/68  131/68  Pulse: 79 64 69 68  Resp: 17 16 18  (!) 30  Temp: 98.4 F (36.9 C) 98.5 F (36.9 C)  98.4 F (36.9 C)  TempSrc: Oral Oral  Oral  SpO2:  100% 99% 100%  Weight: 58.6 kg     Height: 5' 8"  (1.727 m)       General: Pt is alert, awake, not in acute distress Cardiovascular: RRR, S1/S2 +, no rubs, no gallops Respiratory: CTA bilaterally, no wheezing, no rhonchi Abdominal: Soft, NT, ND, bowel sounds + Extremities: no edema, no cyanosis Left eye superficial ecchymosis noted.  No focal neurologic deficits noted.   The results of significant diagnostics from this hospitalization (including imaging, microbiology, ancillary and laboratory) are listed below for reference.     Microbiology: No results found for this or any previous visit (from the past 240 hour(s)).   Labs: BNP (last  3 results) No results for input(s): BNP in the last 8760 hours. Basic Metabolic Panel: Recent Labs  Lab 11/11/18 1554  NA 133*  K 4.0  CL 101  CO2 23  GLUCOSE 126*  BUN 25*  CREATININE 1.05*  CALCIUM 9.5   Liver Function Tests: No results for input(s): AST, ALT, ALKPHOS, BILITOT, PROT, ALBUMIN in the last 168 hours. No results for input(s): LIPASE, AMYLASE in the last 168 hours. No results for input(s): AMMONIA in the last 168 hours. CBC: Recent Labs  Lab 11/11/18 1554 11/12/18 0105  WBC 8.5 6.6  NEUTROABS 7.1  --   HGB 10.9* 9.2*  HCT 33.2* 27.4*  MCV 98.2 97.2  PLT 201 197   Cardiac Enzymes: No results for input(s): CKTOTAL, CKMB, CKMBINDEX, TROPONINI in the last 168 hours. BNP: Invalid input(s): POCBNP CBG: No results for input(s): GLUCAP in the last 168 hours. D-Dimer No results for input(s): DDIMER in the last 72 hours. Hgb A1c No results for input(s): HGBA1C in the last 72 hours. Lipid Profile No results for input(s): CHOL, HDL, LDLCALC, TRIG, CHOLHDL, LDLDIRECT in  the last 72 hours. Thyroid function studies No results for input(s): TSH, T4TOTAL, T3FREE, THYROIDAB in the last 72 hours.  Invalid input(s): FREET3 Anemia work up No results for input(s): VITAMINB12, FOLATE, FERRITIN, TIBC, IRON, RETICCTPCT in the last 72 hours. Urinalysis    Component Value Date/Time   COLORURINE STRAW (A) 04/14/2018 2345   APPEARANCEUR CLEAR 04/14/2018 2345   LABSPEC 1.011 04/14/2018 2345   PHURINE 5.0 04/14/2018 2345   GLUCOSEU NEGATIVE 04/14/2018 2345   HGBUR SMALL (A) 04/14/2018 2345   BILIRUBINUR negative 09/24/2018 1745   KETONESUR negative 09/24/2018 1745   KETONESUR NEGATIVE 04/14/2018 2345   PROTEINUR negative 09/24/2018 1745   PROTEINUR NEGATIVE 04/14/2018 2345   UROBILINOGEN 0.2 09/24/2018 1745   NITRITE Negative 09/24/2018 1745   NITRITE NEGATIVE 04/14/2018 2345   LEUKOCYTESUR Small (1+) (A) 09/24/2018 1745   Sepsis Labs Invalid input(s): PROCALCITONIN,  WBC,  LACTICIDVEN Microbiology No results found for this or any previous visit (from the past 240 hour(s)).   Time coordinating discharge:  I have spent 35 minutes face to face with the patient and on the ward discussing the patients care, assessment, plan and disposition with other care givers. >50% of the time was devoted counseling the patient about the risks and benefits of treatment/Discharge disposition and coordinating care.   SIGNED:   Damita Lack, MD  [facility] 11/12/2018, 3:46 PM   If 7PM-7AM, please contact night-coverage [email or web address]

## 2018-11-12 NOTE — Progress Notes (Deleted)
HGB 6.6 [facility] notified

## 2018-11-12 NOTE — Progress Notes (Signed)
OT Cancellation Note  Patient Details Name: Bethany Park MRN: [id number] DOB: 10/13/1927   Cancelled Treatment:    Reason Eval/Treat Not Completed: Patient at procedure or test/ unavailable(CT. Will return as schedule allows. Thank you.)  [place], OTR/L Acute Rehab Pager: [phone] Office: [phone] 11/12/2018, 11:30 AM

## 2018-11-12 NOTE — Evaluation (Signed)
Occupational Therapy Evaluation Patient Details Name: Bethany Park MRN: [id number] DOB: 1927-07-16 Today's Date: 11/12/2018    History of Present Illness Bethany Park is a 85 y.o. female with medical history significant of having recently moved here from [place] state who has a history of hypertension, coronary artery disease with 3 stents in 2 arteries in the past year, atrial fibrillation, and congestive heart failure who was walking her dog using her electric scooter when the dog pulled away and she fell out of the scooter.  She sustained a sizable hematoma in her left upper forehead and had loss of consciousness. Per daughter pt with early signs of dementia.   Clinical Impression   PTA, pt was living with her daughter and was performing BADLs and using RW for mobility around home; pt using electric scooter outside home. Pt currently requiring Min Guard A for LB ADLs and functional mobility with Min Guard A. Pt presenting with decreased balance and activity tolerance as well as nausea and a headache. Educating pt and daughter on concussion symptoms. Pt would benefit from further acute OT to facilitate safe dc. Recommend dc to home with HHOT for further OT to optimize safety, independence with ADLs, and return to PLOF.      Follow Up Recommendations  Home health OT;Supervision/Assistance - 24 hour    Equipment Recommendations  None recommended by OT    Recommendations for Other Services PT consult     Precautions / Restrictions Precautions Precautions: Fall Precaution Comments: large bruise/hematoma on L side of forehead Restrictions Weight Bearing Restrictions: No      Mobility Bed Mobility Overal bed mobility: Needs Assistance Bed Mobility: Supine to Sit     Supine to sit: Min guard     General bed mobility comments: Min Guard A for safety  Transfers Overall transfer level: Needs assistance Equipment used: Rolling walker (2 wheeled);None Transfers: Sit to/from  Stand Sit to Stand: Min guard         General transfer comment: Min Guard A for safety    Balance Overall balance assessment: Needs assistance Sitting-balance support: Feet supported;Bilateral upper extremity supported Sitting balance-Leahy Scale: Fair     Standing balance support: Single extremity supported Standing balance-Leahy Scale: Poor Standing balance comment: pt dependent on UE support for safe standing. Leaning heavily on sink during grooming                           ADL either performed or assessed with clinical judgement   ADL Overall ADL's : Needs assistance/impaired Eating/Feeding: Set up;Supervision/ safety;Sitting   Grooming: Oral care;Min guard;Standing Grooming Details (indicate cue type and reason): Min Guard A for safety. Pt with heavy reliance on counter with BUEs  Upper Body Bathing: Supervision/ safety;Set up;Sitting   Lower Body Bathing: Min guard;Sit to/from stand   Upper Body Dressing : Supervision/safety;Set up;Sitting   Lower Body Dressing: Min guard;Sit to/from stand Lower Body Dressing Details (indicate cue type and reason): Pt able to don/doff socks by bringing ankles to knees Toilet Transfer: Min guard;Ambulation;RW(simulated in room)           Functional mobility during ADLs: Min guard;Rolling walker General ADL Comments: Pt with decreased cognition and reports she is nauseous throughout session. Requiring supervision-Min Guard throughout     Vision Baseline Vision/History: Wears glasses Patient Visual Report: Blurring of vision;Other (comment)(Blurry vision when rolling to left) Additional Comments: Denies any diplopia or blurry vision     Perception  Praxis      Pertinent Vitals/Pain Pain Assessment: No/denies pain     Hand Dominance Right   Extremity/Trunk Assessment Upper Extremity Assessment Upper Extremity Assessment: Generalized weakness   Lower Extremity Assessment Lower Extremity Assessment:  Generalized weakness   Cervical / Trunk Assessment Cervical / Trunk Assessment: Kyphotic   Communication Communication Communication: No difficulties   Cognition Arousal/Alertness: Awake/alert Behavior During Therapy: WFL for tasks assessed/performed Overall Cognitive Status: No family/caregiver present to determine baseline cognitive functioning                                 General Comments: pt appears appropriate, mildly tangential with conversation. Requiring increased cues and presenting with poor ST memory. When walking to sink for grooming, pt began to walk out of room adn required cues to recall the task.    General Comments  Daughter arriving at end of session. Providing education on concussion symptoms.     Exercises     Shoulder Instructions      Home Living Family/patient expects to be discharged to:: Private residence Living Arrangements: Children Available Help at Discharge: Family;Available 24 hours/day Type of Home: House Home Access: Stairs to enter [employer] of Steps: 1 Entrance Stairs-Rails: None Home Layout: One level     Bathroom Shower/Tub: Corporate investment banker: Standard Bathroom Accessibility: Yes   Home Equipment: Environmental consultant - 4 wheels;Shower seat;Bedside commode;Grab bars - tub/shower;Electric scooter          Prior Functioning/Environment Level of Independence: Needs assistance  Gait / Transfers Assistance Needed: uses electric scooter outside, RW inside, assist on the stairs ADL's / Homemaking Assistance Needed: pt was bathing/dressing with supervision, pt doens't cook or meal prep   Comments: recently moved here form [employer]        OT Problem List: Decreased strength;Decreased range of motion;Decreased activity tolerance;Impaired balance (sitting and/or standing);Decreased safety awareness;Decreased knowledge of use of DME or AE;Decreased knowledge of precautions;Decreased  cognition;Pain      OT Treatment/Interventions: Self-care/ADL training;Therapeutic exercise;Energy conservation;DME and/or AE instruction;Therapeutic activities;Patient/family education    OT Goals(Current goals can be found in the care plan section) Acute Rehab OT Goals Patient Stated Goal: "Go home" OT Goal Formulation: With patient/family Time For Goal Achievement: 11/26/18 Potential to Achieve Goals: Good  OT Frequency: Min 2X/week   Barriers to D/C:            Co-evaluation              AM-PAC OT "6 Clicks" Daily Activity     Outcome Measure Help from another person eating meals?: None Help from another person taking care of personal grooming?: A Little Help from another person toileting, which includes using toliet, bedpan, or urinal?: A Little Help from another person bathing (including washing, rinsing, drying)?: A Little Help from another person to put on and taking off regular upper body clothing?: None Help from another person to put on and taking off regular lower body clothing?: A Little 6 Click Score: 20   End of Session Equipment Utilized During Treatment: Gait belt;Rolling walker Nurse Communication: Mobility status  Activity Tolerance: Patient limited by fatigue Patient left: in bed;with call bell/phone within reach;with bed alarm set;with family/visitor present;with nursing/sitter in room  OT Visit Diagnosis: Unsteadiness on feet (R26.81);Other abnormalities of gait and mobility (R26.89);Muscle weakness (generalized) (M62.81);Other symptoms and signs involving cognitive function;Pain Pain - part of body: (HA)  Time: 1439-1500 OT Time Calculation (min): 21 min Charges:  OT General Charges $OT Visit: 1 Visit OT Evaluation $OT Eval Moderate Complexity: [place], OTR/L Acute Rehab Pager: [phone] Office: [place] 11/12/2018, 4:30 PM

## 2018-11-15 ENCOUNTER — Encounter: Payer: Self-pay | Admitting: Osteopathic Medicine

## 2018-11-15 ENCOUNTER — Ambulatory Visit: Payer: Medicare Other | Admitting: Osteopathic Medicine

## 2018-11-15 VITALS — BP 200/91 | HR 74 | Temp 98.2°F | Wt 133.3 lb

## 2018-11-15 DIAGNOSIS — T148XXA Other injury of unspecified body region, initial encounter: Secondary | ICD-10-CM

## 2018-11-15 NOTE — Progress Notes (Signed)
HPI: Bethany Park is a 85 y.o. female who  has a past medical history of Anemia, Aortic stenosis, Atrial fibrillation ([place]), B12 deficiency (03/13/2018), Colon polyp (09/2013), Dementia ([place]), Depression with anxiety (03/13/2018), Essential hypertension (03/13/2018), Glucose intolerance (03/13/2018), Heart attack ([place]), Heart failure ([place]), History of COPD, History of coronary artery stent placement (09/2017), Moderate aortic stenosis (03/13/2018), Osteoporosis (03/13/2018), Ovarian cancer ([place]), Restless leg syndrome (03/13/2018), and Stroke ([place]).  she [facility], [facility]15/18,  for chief complaint of:  Fall - follow-up   Fell at home, was pulled out of electric scooter/wheelchair while taking the dog outside for a walk. L forehead injury w/ hematoma. Ct showed small subarachnoid hemorrhage, neurosurg recs: repeat CT (was improved), hold plavix 3-5 days, f/u outpatient, home PT.   Here today for follow-up. Daughter concerned about enlarging and significantly painful bump on left forehead, skin has started to separate and has formed a black scab centrally. No blurred vision, no headaches, no SOB. Daughter reports some dizziness.         At today's visit... Past medical history, surgical history, and family history reviewed and updated as needed.  Current medication list and allergy/intolerance information reviewed and updated as needed. (See remainder of HPI, ROS, Phys Exam below)              ASSESSMENT/PLAN: The encounter diagnosis was Hematoma and contusion.   Procedure note: After unsuccessful attempt to drain with 18-gauge needle, area was cleaned and anesthetized with lidocaine/epinephrine, 2 cc.  Small incision was made, hemostat was inserted and clot was removed manually piece by piece.  Elza Rafter was put in place, pressure bandage was applied, patient will come back tomorrow to recheck and remove wick.   No orders of the defined  types were placed in this encounter.    No orders of the defined types were placed in this encounter.     Follow-up plan: No follow-ups on file.                             ############################################ ############################################ ############################################ ############################################    Current Meds  Medication Sig  . albuterol (PROVENTIL HFA;VENTOLIN HFA) 108 (90 Base) MCG/ACT inhaler Inhale 2 puffs into the lungs every 6 (six) hours as needed for wheezing.  . [facility] bed with rails  . AMBULATORY NON FORMULARY MEDICATION Wheelchair  . amitriptyline (ELAVIL) 25 MG tablet Take 1 tablet (25 mg total) by mouth at bedtime.  Marland Kitchen atorvastatin (LIPITOR) 40 MG tablet Take 1 tablet (40 mg total) by mouth daily.  . carvedilol (COREG) 3.125 MG tablet Take 1 tablet (3.125 mg total) by mouth 2 (two) times daily with a meal.  . clopidogrel (PLAVIX) 75 MG tablet Take 1 tablet (75 mg total) by mouth daily.  . famotidine (PEPCID) 40 MG tablet Take 1 tablet (40 mg total) by mouth daily.  Marland Kitchen HYDROcodone-acetaminophen (NORCO) 5-325 MG tablet Take 1 tablet by mouth every 6 (six) hours as needed for severe pain. #45 for 30 days  . lisinopril (PRINIVIL,ZESTRIL) 2.5 MG tablet Take 1 tablet (2.5 mg total) by mouth daily.  Marland Kitchen lubiprostone (AMITIZA) 8 MCG capsule Take 1 capsule (8 mcg total) by mouth 2 (two) times daily with a meal. Start day after next bowel movement  . Melatonin 10 MG SUBL Place 10 mg under the tongue at bedtime.  . nitroGLYCERIN (NITROSTAT) 0.4 MG SL tablet Place 1 tablet (0.4  mg total) under the tongue every 5 (five) minutes as needed for chest pain. If no better 10-15 mins, go to hospital  . nystatin (MYCOSTATIN/NYSTOP) powder Apply topically 4 (four) times daily.  Marland Kitchen rOPINIRole (REQUIP) 3 MG tablet Take 1 tablet (3 mg total) by mouth 3 (three) times daily.  Marland Kitchen  umeclidinium-vilanterol (ANORO ELLIPTA) 62.5-25 MCG/INH AEPB Inhale 1 puff into the lungs daily.    Allergies  Allergen Reactions  . Alendronate Sodium   . Benztropine Mesylate [Benztropine]   . Citalopram   . Cymbalta [Duloxetine Hcl]   . Erythromycin   . Fluoxetine Hcl   . Glimepiride   . Nortriptyline Hcl   . Penicillins   . Sulfa Antibiotics   . Seroquel [Quetiapine Fumarate] Other (See Comments)    As per daughter, hallucinations.       Review of Systems:  Constitutional: No recent illness  HEENT: No  headache, no vision change  Cardiac: No  chest pain, No  pressure, No palpitations  Respiratory:  No  shortness of breath. No  Cough  Gastrointestinal: No  abdominal pain, no change on bowel habits  Musculoskeletal: No new myalgia/arthralgia  Skin: No  Rash, extensive contusions/ecchymoses face  Hem/Onc: +easy bruising/bleeding  Neurologic: +generalized weakness, +Dizziness   Exam:  BP (!) 200/91 (BP Location: Left Arm, Patient Position: Sitting, Cuff Size: Normal)   Pulse 74   Temp 98.2 F (36.8 C) (Oral)   Wt 133 lb 4.8 oz (60.5 kg)   BMI 20.27 kg/m   Constitutional: VS see above. General Appearance: alert, well-developed, well-nourished, NAD  Eyes: Normal lids and conjunctive, non-icteric sclera  Ears, Nose, Mouth, Throat: MMM, Normal external inspection ears/nares/mouth/lips/gums.  Neck: No masses, trachea midline.   Respiratory: Normal respiratory effort.   Musculoskeletal: Gait normal. Symmetric and independent movement of all extremities  Neurological: Normal balance/coordination. No tremor.  Skin: warm, dry.  Extensive bruising and ecchymosis around face, with hematoma on left forehead see photograph above  Psychiatric: Normal judgment/insight. Normal mood and affect. Oriented x3.      Ct Head Wo Contrast  Result Date: 11/12/2018 CLINICAL DATA:  The patient suffered a small subarachnoid hemorrhage yesterday due to a fall. Initial  encounter. EXAM: CT HEAD WITHOUT CONTRAST TECHNIQUE: Contiguous axial images were obtained from the base of the skull through the vertex without intravenous contrast. COMPARISON:  Head CT scan 04/15/2018 and 11/11/2018. FINDINGS: Brain: Small focus of hyperattenuation in the posterior left occipital lobe seen on yesterday's examination is slightly smaller. No new hemorrhage is identified. No hydrocephalus, intraventricular hemorrhage, mass midline shift. Atrophy, chronic microvascular ischemic change and remote left PCA infarct are noted. Vascular: No hyperdense vessel or unexpected calcification. Skull: Intact. Sinuses/Orbits: The patient is status post maxillary antrostomy. Minimal mucosal thickening right maxillary sinus noted. Other: Scalp hematoma on the left is seen as on yesterday's exam. IMPRESSION: Small subarachnoid hemorrhage in the posterior left occipital lobe has slightly decreased since yesterday's examination. No new abnormality. Atrophy, chronic microvascular ischemic change and remote left PCA infarct. Electronically Signed   By: Inge Rise M.D.   On: 11/12/2018 11:51        Ct Head Wo Contrast  Result Date: 11/11/2018 CLINICAL DATA:  Fall.  Left forehead hematoma. EXAM: CT HEAD WITHOUT CONTRAST CT CERVICAL SPINE WITHOUT CONTRAST TECHNIQUE: Multidetector CT imaging of the head and cervical spine was performed following the standard protocol without intravenous contrast. Multiplanar CT image reconstructions of the cervical spine were also generated. COMPARISON:  CT head dated April 15, 2018.  The lungs are FINDINGS: CT HEAD FINDINGS Brain: New trace subarachnoid blood in the left occipital lobe adjacent to the old left parieto-occipital infarct. There is also a small amount of trace subarachnoid blood in the posterior left cerebellar hemisphere. No evidence of acute infarction, hydrocephalus, extra-axial collection or mass lesion/mass effect. Stable mild atrophy and chronic  microvascular ischemic changes. Vascular: Calcified atherosclerosis at the skullbase. No hyperdense vessel. Skull: Negative for fracture or focal lesion. Sinuses/Orbits: No acute finding. Chronic right maxillary sinusitis. Other: Large left frontal scalp hematoma.  Old nasal bone fractures. CT CERVICAL SPINE FINDINGS Alignment: No traumatic malalignment. Reversal of the normal cervical lordosis at C4-C5. 2 mm facet mediated anterolisthesis at C3-C4. Skull base and vertebrae: No acute fracture. No primary bone lesion or focal pathologic process. 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 Soft tissues and spinal canal: No prevertebral fluid or swelling. No visible canal hematoma. Disc levels: Multilevel degenerative disc disease of the cervical spine, moderate at C5-C6. Scattered mild to moderate bilateral facet arthropathy. Upper chest: Negative. Other: None. IMPRESSION: CT head: 1. Trace subarachnoid hemorrhage in the left occipital lobe and posterior left cerebellar hemisphere. 2. Large left frontal scalp hematoma. 3. 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 CT cervical spine: 1.  No acute cervical spine fracture. 2. Mild-to-moderate cervical spondylosis. Critical Value/emergent results were called by telephone at the time of interpretation on 11/11/2018 at 2:58 pm to Dr. Daleen Bo , who verbally acknowledged these results. Electronically Signed   By: Titus Dubin M.D.   On: 11/11/2018 15:01   Ct Cervical Spine Wo Contrast  Result Date: 11/11/2018 CLINICAL DATA:  Fall.  Left forehead hematoma. EXAM: CT HEAD WITHOUT CONTRAST CT CERVICAL SPINE WITHOUT CONTRAST TECHNIQUE: Multidetector CT imaging of the head and cervical spine was performed following the standard protocol without intravenous contrast. Multiplanar CT image reconstructions of the cervical spine were also generated. COMPARISON:  CT head dated April 15, 2018.  The lungs are FINDINGS: CT HEAD FINDINGS Brain: New trace subarachnoid blood  in the left occipital lobe adjacent to the old left parieto-occipital infarct. There is also a small amount of trace subarachnoid blood in the posterior left cerebellar hemisphere. No evidence of acute infarction, hydrocephalus, extra-axial collection or mass lesion/mass effect. Stable mild atrophy and chronic microvascular ischemic changes. Vascular: Calcified atherosclerosis at the skullbase. No hyperdense vessel. Skull: Negative for fracture or focal lesion. Sinuses/Orbits: No acute finding. Chronic right maxillary sinusitis. Other: Large left frontal scalp hematoma.  Old nasal bone fractures. CT CERVICAL SPINE FINDINGS Alignment: No traumatic malalignment. Reversal of the normal cervical lordosis at C4-C5. 2 mm facet mediated anterolisthesis at C3-C4. Skull base and vertebrae: No acute fracture. No primary bone lesion or focal pathologic process. 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 Soft tissues and spinal canal: No prevertebral fluid or swelling. No visible canal hematoma. Disc levels: Multilevel degenerative disc disease of the cervical spine, moderate at C5-C6. Scattered mild to moderate bilateral facet arthropathy. Upper chest: Negative. Other: None. IMPRESSION: CT head: 1. Trace subarachnoid hemorrhage in the left occipital lobe and posterior left cerebellar hemisphere. 2. Large left frontal scalp hematoma. 3. 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 CT cervical spine: 1.  No acute cervical spine fracture. 2. Mild-to-moderate cervical spondylosis. Critical Value/emergent results were called by telephone at the time of interpretation on 11/11/2018 at 2:58 pm to Dr. Daleen Bo , who verbally acknowledged these results. Electronically Signed   By: Titus Dubin M.D.   On: 11/11/2018  15:01      Visit summary with medication list and pertinent instructions was printed for patient to review, patient was advised to alert [place] if any updates are needed. All questions at time of visit were  answered - patient instructed to contact office with any additional concerns. ER/RTC precautions were reviewed with the patient and understanding verbalized.   Note: Total time spent 40 minutes, greater than 50% of the visit was spent face-to-face counseling and coordinating care for the following: The encounter diagnosis was Hematoma and contusion..  Please note: voice recognition software was used to produce this document, and typos may escape review. Please contact Dr. Sheppard Coil for any needed clarifications.    Follow up plan: Return for recheck scalp wound and hematoma tomorrow.

## 2018-11-16 ENCOUNTER — Telehealth: Payer: Self-pay

## 2018-11-16 ENCOUNTER — Ambulatory Visit (INDEPENDENT_AMBULATORY_CARE_PROVIDER_SITE_OTHER): Payer: Medicare Other | Admitting: Osteopathic Medicine

## 2018-11-16 ENCOUNTER — Encounter: Payer: Self-pay | Admitting: Osteopathic Medicine

## 2018-11-16 VITALS — BP 133/72 | HR 89 | Temp 97.7°F | Wt 136.6 lb

## 2018-11-16 DIAGNOSIS — F0390 Unspecified dementia without behavioral disturbance: Secondary | ICD-10-CM | POA: Diagnosis not present

## 2018-11-16 DIAGNOSIS — S066X0D Traumatic subarachnoid hemorrhage without loss of consciousness, subsequent encounter: Secondary | ICD-10-CM | POA: Diagnosis not present

## 2018-11-16 DIAGNOSIS — I130 Hypertensive heart and chronic kidney disease with heart failure and stage 1 through stage 4 chronic kidney disease, or unspecified chronic kidney disease: Secondary | ICD-10-CM | POA: Diagnosis not present

## 2018-11-16 DIAGNOSIS — I509 Heart failure, unspecified: Secondary | ICD-10-CM | POA: Diagnosis not present

## 2018-11-16 DIAGNOSIS — S0083XD Contusion of other part of head, subsequent encounter: Secondary | ICD-10-CM | POA: Diagnosis not present

## 2018-11-16 DIAGNOSIS — T148XXA Other injury of unspecified body region, initial encounter: Secondary | ICD-10-CM

## 2018-11-16 DIAGNOSIS — N183 Chronic kidney disease, stage 3 (moderate): Secondary | ICD-10-CM | POA: Diagnosis not present

## 2018-11-16 DIAGNOSIS — E1122 Type 2 diabetes mellitus with diabetic chronic kidney disease: Secondary | ICD-10-CM | POA: Diagnosis not present

## 2018-11-16 DIAGNOSIS — I4891 Unspecified atrial fibrillation: Secondary | ICD-10-CM | POA: Diagnosis not present

## 2018-11-16 DIAGNOSIS — I2510 Atherosclerotic heart disease of native coronary artery without angina pectoris: Secondary | ICD-10-CM | POA: Diagnosis not present

## 2018-11-16 MED ORDER — CEPHALEXIN 500 MG PO CAPS: 500.0000 mg | ORAL_CAPSULE | Freq: Two times a day (BID) | ORAL | 0 refills | Status: DC

## 2018-11-16 NOTE — Patient Instructions (Signed)
Pressure for 3-5 days at least while awake, preferably always.  Will recheck in a week, sooner if needed  Any problems with the antibiotics let me know

## 2018-11-16 NOTE — Telephone Encounter (Signed)
Mickle Plumb from [facility] called requesting to continue therapy care suggested by ER doctor. As per RN, working with patient to strengthen mobility due to fall from wheelchair. Verbal order was given to continue at home care.

## 2018-11-16 NOTE — Progress Notes (Signed)
CC: wound recheck s/p hematoma evacuation  S: feeling better, difficult to keep pressure bandage in place overnight.    O: No recurrence of hematoma, packing removed, no redness to skin or purulent drainage. BP 133/72 (BP Location: Left Arm, Patient Position: Sitting, Cuff Size: Normal)   Pulse 89   Temp 97.7 F (36.5 C) (Oral)   Wt 136 lb 9.6 oz (62 kg)   BMI 20.77 kg/m   A/P: we reviewed allergies, she has had Keflex in the past without incident, she reports "I was told I can't ever have penicillin" childhood allergy, questionable whether trua anaphylactic reaction. I sent Keflex, given prolonged exposure yesterday and proximity to skull/brain/eye to avoid a complicating cellulitis. Wound care reviewed, steri-strip applied   Meds ordered this encounter  Medications  . cephALEXin (KEFLEX) 500 MG capsule    Sig: Take 1 capsule (500 mg total) by mouth 2 (two) times daily.    Dispense:  14 capsule    Refill:  0   Patient Instructions  Pressure for 3-5 days at least while awake, preferably always.  Will recheck in a week, sooner if needed  Any problems with the antibiotics let me know    Return in about 1 week (around 11/23/2018) for recheck wound - sooner if needed .

## 2018-11-19 NOTE — Telephone Encounter (Signed)
Agreed, thanks

## 2018-11-20 DIAGNOSIS — I130 Hypertensive heart and chronic kidney disease with heart failure and stage 1 through stage 4 chronic kidney disease, or unspecified chronic kidney disease: Secondary | ICD-10-CM | POA: Diagnosis not present

## 2018-11-20 DIAGNOSIS — S066X0D Traumatic subarachnoid hemorrhage without loss of consciousness, subsequent encounter: Secondary | ICD-10-CM | POA: Diagnosis not present

## 2018-11-20 DIAGNOSIS — I509 Heart failure, unspecified: Secondary | ICD-10-CM | POA: Diagnosis not present

## 2018-11-20 DIAGNOSIS — S0083XD Contusion of other part of head, subsequent encounter: Secondary | ICD-10-CM | POA: Diagnosis not present

## 2018-11-20 DIAGNOSIS — E1122 Type 2 diabetes mellitus with diabetic chronic kidney disease: Secondary | ICD-10-CM | POA: Diagnosis not present

## 2018-11-20 DIAGNOSIS — I2510 Atherosclerotic heart disease of native coronary artery without angina pectoris: Secondary | ICD-10-CM | POA: Diagnosis not present

## 2018-11-21 ENCOUNTER — Telehealth: Payer: Self-pay

## 2018-11-21 DIAGNOSIS — I2510 Atherosclerotic heart disease of native coronary artery without angina pectoris: Secondary | ICD-10-CM | POA: Diagnosis not present

## 2018-11-21 DIAGNOSIS — S0083XD Contusion of other part of head, subsequent encounter: Secondary | ICD-10-CM | POA: Diagnosis not present

## 2018-11-21 DIAGNOSIS — I130 Hypertensive heart and chronic kidney disease with heart failure and stage 1 through stage 4 chronic kidney disease, or unspecified chronic kidney disease: Secondary | ICD-10-CM | POA: Diagnosis not present

## 2018-11-21 DIAGNOSIS — E1122 Type 2 diabetes mellitus with diabetic chronic kidney disease: Secondary | ICD-10-CM | POA: Diagnosis not present

## 2018-11-21 DIAGNOSIS — S066X0D Traumatic subarachnoid hemorrhage without loss of consciousness, subsequent encounter: Secondary | ICD-10-CM | POA: Diagnosis not present

## 2018-11-21 DIAGNOSIS — I509 Heart failure, unspecified: Secondary | ICD-10-CM | POA: Diagnosis not present

## 2018-11-21 NOTE — Telephone Encounter (Signed)
OK to follow these recs.  If able, can call back and give them a verbal okay.  If there are any additional orders that they need me to sign, they can fax it

## 2018-11-21 NOTE — Telephone Encounter (Signed)
Left a message advising of recommendations.  

## 2018-11-21 NOTE — Telephone Encounter (Signed)
Michael from [facility] called and states she needs to go out for OT 2 wk3 work on ADL's. Please advise.

## 2018-11-22 ENCOUNTER — Ambulatory Visit (INDEPENDENT_AMBULATORY_CARE_PROVIDER_SITE_OTHER): Payer: Medicare Other | Admitting: Osteopathic Medicine

## 2018-11-22 ENCOUNTER — Encounter: Payer: Self-pay | Admitting: Osteopathic Medicine

## 2018-11-22 VITALS — BP 92/55 | HR 70 | Temp 97.5°F | Wt 131.3 lb

## 2018-11-22 DIAGNOSIS — T148XXA Other injury of unspecified body region, initial encounter: Secondary | ICD-10-CM

## 2018-11-22 DIAGNOSIS — M7981 Nontraumatic hematoma of soft tissue: Secondary | ICD-10-CM | POA: Diagnosis not present

## 2018-11-22 DIAGNOSIS — I96 Gangrene, not elsewhere classified: Secondary | ICD-10-CM

## 2018-11-22 NOTE — Patient Instructions (Signed)
Sutured Wound Care Sutures are stitches that can be used to close wounds. Taking care of your wound properly can help to prevent pain and infection. It can also help your wound to heal more quickly. Follow instructions from your health care provider about how to care for your sutured wound. Supplies needed:  Soap and water.  A clean bandage (dressing), if needed.  Antibiotic ointment.  A clean towel. How to care for your sutured wound   Keep the wound completely dry for the first 24 hours, or for as long as directed by your health care provider. After 24-48 hours, you may shower or bathe as directed by your health care provider. Do not soak or submerge the wound in water until the sutures have been removed.  After the first 24 hours, clean the wound once a day, or as often as directed by your health care provider, using the following steps: ? Wash the wound with soap and water. ? Rinse the wound with water to remove all soap. ? Pat the wound dry with a clean towel. Do not rub the wound.  After cleaning the wound, apply a thin layer of antibiotic ointment as directed by your health care provider. This will prevent infection and keep the dressing from sticking to the wound.  Follow instructions from your health care provider about how to change your dressing: ? Wash your hands with soap and water. If soap and water are not available, use hand sanitizer. ? Change your dressing at least once a day, or as often as told by your health care provider. If your dressing gets wet or dirty, change it. ? Leave sutures and other skin closures, such as adhesive tape or skin glue, in place. These skin closures may need to stay in place for 2 weeks or longer. If adhesive strip edges start to loosen and curl up, you may trim the loose edges. Do not remove adhesive strips completely unless your health care provider tells you to do that.  Check your wound every day for signs of infection. Watch  for: ? Redness, swelling, or pain. ? Fluid or blood. ? Warmth. ? Pus or a bad smell.  Have the sutures removed as directed by your health care provider. Follow these instructions at home: Medicines  Take or apply over-the-counter and prescription medicines only as told by your health care provider.  If you were prescribed an antibiotic medicine or ointment, take or apply it as told by your health care provider. Do not stop using the antibiotic even if your condition improves. General instructions  To help reduce scarring after your wound heals, cover your wound with clothing or apply sunscreen of at least 30 SPF whenever you are outside.  Do not scratch or pick at your wound.  Avoid stretching your wound.  Raise (elevate) the injured area above the level of your heart while you are sitting or lying down, if possible.  Drink enough fluids to keep your urine clear or pale yellow.  Keep all follow-up visits as told by your health care provider. This is important. Contact a health care provider if:  You received a tetanus shot and you have swelling, severe pain, redness, or bleeding at the injection site.  Your wound breaks open.  You have redness, swelling, or pain around your wound.  You have fluid or blood coming from your wound.  Your wound feels warm to the touch.  You have a fever.  You notice something coming out of your  wound, such as wood or glass.  You have pain that does not get better with medicine.  The skin near your wound changes color.  You need to change your dressing very frequently due to a lot of fluid, blood, or pus draining from the wound.  You develop a new rash.  You develop numbness around the wound. Get help right away if:  You develop severe swelling around your wound.  You have pus or a bad smell coming from your wound.  Your pain suddenly gets worse and is severe.  You develop painful lumps near your wound or anywhere on your  body.  You have a red streak going away from your wound.  The wound is on your hand or foot and: ? You cannot properly move a finger or toe. ? Your fingers or toes look pale or bluish. ? You have numbness that is spreading down your hand, foot, fingers, or toes. Summary  Sutures are stitches that can be used to close wounds.  Taking care of your wound properly can help to prevent pain and infection.  Keep the wound completely dry for the first 24 hours, or for as long as directed by your health care provider. After 24-48 hours, you may shower or bathe as directed by your health care provider. This information is not intended to replace advice given to you by your health care provider. Make sure you discuss any questions you have with your health care provider. Document Released: 11/24/2004 Document Revised: 11/22/2016 Document Reviewed: 11/22/2016 Elsevier Interactive Patient Education  [address].   Sterile Tape Wound Care Some cuts and wounds can be closed using sterile tape, also called skin adhesive strips. Skin adhesive strips can be used for shallow (superficial) and simple cuts, wounds, lacerations, and some surgical incisions. These strips act in place of stitches, or in addition to stitches, to hold the edges of the wound together to allow for better healing. Unlike stitches, the adhesive strips do not require needles or anesthetic medicine for placement. The strips usually fall off on their own as the wound is healing. It is important to take proper care of your wound at home while it heals. How to care for a sterile tape wound   Try to keep the area around your wound clean and dry. Do not allow the adhesive strips to get wet for the first 12 hours.  Do not use any soaps or ointments on the wound for the first 12 hours.  If a bandage (dressing) has been applied, keep it dry.  Follow instructions from your health care provider about how often to change the  dressing. ? Wash your hands with soap and water before you change your dressing. If soap and water are not available, use hand sanitizer. ? Change your dressing as told by your health care provider. ? Leave adhesive strips in place. These skin closures may need to stay in place for 2 weeks or longer. If adhesive strip edges start to loosen and curl up, you may trim the loose edges. Do not remove adhesive strips completely unless your health care provider tells you to do that.  Do not scratch, rub, or pick at the wound area.  Protect the wound from further injury until it is healed.  Protect the wound from sun and tanning bed exposure while it is healing, and for several weeks after healing.  Check the wound every day for signs of infection. Check for: ? More redness, swelling, or pain. ?  More fluid or blood. ? Warmth. ? Pus or a bad smell. Follow these instructions at home:  Take over-the-counter and prescription medicines only as told by your health care provider.  Keep all follow-up visits as told by your health care provider. This is important. Contact a health care provider if:  Your adhesive strips become soaked with blood or fall off before the wound has healed. The tape will need to be replaced.  You have a fever. Get help right away if:  You have chills.  You develop a rash after the strips are applied.  You have a red streak that goes away from the wound.  You have more redness, swelling, or pain around your wound.  You have more fluid or blood coming from your wound.  Your wound feels warm to the touch.  You have pus or a bad smell coming from your wound.  Your wound breaks open. This information is not intended to replace advice given to you by your health care provider. Make sure you discuss any questions you have with your health care provider. Document Released: 11/24/2004 Document Revised: 09/09/2016 Document Reviewed: 09/09/2016 Elsevier Interactive  Patient Education  2019 [employer].

## 2018-11-22 NOTE — Progress Notes (Signed)
HPI: Bethany Park is a 85 y.o. female who  has a past medical history of Anemia, Aortic stenosis, Atrial fibrillation ([place]), B12 deficiency (03/13/2018), Colon polyp (09/2013), Dementia ([place]), Depression with anxiety (03/13/2018), Essential hypertension (03/13/2018), Glucose intolerance (03/13/2018), Heart attack ([place]), Heart failure ([place]), History of COPD, History of coronary artery stent placement (09/2017), Moderate aortic stenosis (03/13/2018), Osteoporosis (03/13/2018), Ovarian cancer ([place]), Restless leg syndrome (03/13/2018), and Stroke ([place]).  she presents to [facility] today, 11/22/18,  for chief complaint of:  Wound check   Patient is here today in the office for wound check, accompanied by her daughter.  Last week we evacuated a significant hematoma on the forehead, sustained with a fall.  See photos below.  Patient reports overall relatively comfortable, it is interfering some with sleep, she has been inconsistent applying pressure bandage.  She is taking the antibiotics, no dizziness or headache out of the ordinary  Blood pressure is a bit on the low side, daughter thinks she may have accidentally given the patient 2 of her lisinopril this morning.   At today's visit 11/22/18 ... PMH, PSH, FH reviewed and updated as needed.  Current medication list and allergy/intolerance hx reviewed and updated as needed. (See remainder of HPI, ROS, Phys Exam below)   On initial presentation last week.     After debridement of eschar and necrotic skin and revision of wound borders   Final today after procedure         ASSESSMENT/PLAN: The primary encounter diagnosis was Hematoma and contusion. A diagnosis of Skin necrosis ([place]) was also pertinent to this visit.   Procedure note: Informed consent obtained from daughter after discussion of risks versus benefits.  Area was cleaned with chlorhexidine, anesthetized with 2% lidocaine without epi, central area  of necrosis/scabbing was removed using forceps and blunt dissection and scissors.  Significant gaping wound was left, wound edges were revised with scissors into an ellipse, widest approx 5 cm, skin was undermined and healthy bleeding edges were approximated, closed with 3 subcutaneous 4-0 Vicryl horizontal mattress sutures and 4 running mattress sutures of 4-0 Prolene superficially, 1 simple interrupted suture superfcially.  Reinforced with Steri-Strips.  Instructions for sutures and Steri-Strip wound care were provided.  Patient will return for suture removal in 11 days, 10 days as a Sunday.  Daughter is advised on what to watch out for concerning infection, increased pain, other potential problems.    Advised to hold lisinopril if home blood pressures continue to be less than 062 systolic, less than 60 diastolic.      Follow-up plan: Return in about 11 days (around 12/03/2018) for suture removal - sooner if needed.                                                 ################################################# ################################################# ################################################# #################################################    Current Meds  Medication Sig  . albuterol (PROVENTIL HFA;VENTOLIN HFA) 108 (90 Base) MCG/ACT inhaler Inhale 2 puffs into the lungs every 6 (six) hours as needed for wheezing.  . [facility] bed with rails  . AMBULATORY NON FORMULARY MEDICATION Wheelchair  . amitriptyline (ELAVIL) 25 MG tablet Take 1 tablet (25 mg total) by mouth at bedtime.  Marland Kitchen atorvastatin (LIPITOR) 40 MG tablet Take 1 tablet (40 mg total) by mouth daily.  . carvedilol (COREG) 3.125 MG  tablet Take 1 tablet (3.125 mg total) by mouth 2 (two) times daily with a meal.  . cephALEXin (KEFLEX) 500 MG capsule Take 1 capsule (500 mg total) by mouth 2 (two) times daily.  . clopidogrel (PLAVIX)  75 MG tablet Take 1 tablet (75 mg total) by mouth daily.  . famotidine (PEPCID) 40 MG tablet Take 1 tablet (40 mg total) by mouth daily.  Marland Kitchen HYDROcodone-acetaminophen (NORCO) 5-325 MG tablet Take 1 tablet by mouth every 6 (six) hours as needed for severe pain. #45 for 30 days  . lisinopril (PRINIVIL,ZESTRIL) 2.5 MG tablet Take 1 tablet (2.5 mg total) by mouth daily.  Marland Kitchen lubiprostone (AMITIZA) 8 MCG capsule Take 1 capsule (8 mcg total) by mouth 2 (two) times daily with a meal. Start day after next bowel movement  . Melatonin 10 MG SUBL Place 10 mg under the tongue at bedtime.  . nitroGLYCERIN (NITROSTAT) 0.4 MG SL tablet Place 1 tablet (0.4 mg total) under the tongue every 5 (five) minutes as needed for chest pain. If no better 10-15 mins, go to hospital  . nystatin (MYCOSTATIN/NYSTOP) powder Apply topically 4 (four) times daily.  Marland Kitchen rOPINIRole (REQUIP) 3 MG tablet Take 1 tablet (3 mg total) by mouth 3 (three) times daily.  Marland Kitchen umeclidinium-vilanterol (ANORO ELLIPTA) 62.5-25 MCG/INH AEPB Inhale 1 puff into the lungs daily.    Allergies  Allergen Reactions  . Alendronate Sodium   . Benztropine Mesylate [Benztropine]   . Citalopram   . Cymbalta [Duloxetine Hcl]   . Erythromycin   . Fluoxetine Hcl   . Glimepiride   . Nortriptyline Hcl   . Penicillins   . Seroquel [Quetiapine Fumarate] Other (See Comments)    As per daughter, hallucinations.       Review of Systems:  Constitutional: No recent illness  HEENT: No  headache, no vision change  Cardiac: No  chest pain, No  pressure, No palpitations  Respiratory:  No  shortness of breath. No  Cough  Gastrointestinal: No  abdominal pain  Musculoskeletal: No new myalgia/arthralgia  Skin: see HPI    Exam:  BP (!) 92/55 (BP Location: Left Arm, Patient Position: Sitting, Cuff Size: Normal)   Pulse 70   Temp (!) 97.5 F (36.4 C) (Oral)   Wt 131 lb 4.8 oz (59.6 kg)   BMI 19.96 kg/m   Constitutional: VS see above. General Appearance:  alert, well-developed, well-nourished, NAD  Neck: No masses, trachea midline.   Respiratory: Normal respiratory effort.  Musculoskeletal: Gait normal.   Neurological: Normal balance/coordination. No tremor.  Skin: warm, dry.   Psychiatric: Fair judgment/insight. Normal mood and affect.       Visit summary with medication list and pertinent instructions was printed for patient to review, patient was advised to alert [place] if any updates are needed. All questions at time of visit were answered - patient instructed to contact office with any additional concerns. ER/RTC precautions were reviewed with the patient and understanding verbalized.    Please note: voice recognition software was used to produce this document, and typos may escape review. Please contact Dr. Sheppard Coil for any needed clarifications.    Follow up plan: Return in about 11 days (around 12/03/2018) for suture removal - sooner if needed.

## 2018-11-23 DIAGNOSIS — S0083XD Contusion of other part of head, subsequent encounter: Secondary | ICD-10-CM | POA: Diagnosis not present

## 2018-11-23 DIAGNOSIS — S066X0D Traumatic subarachnoid hemorrhage without loss of consciousness, subsequent encounter: Secondary | ICD-10-CM | POA: Diagnosis not present

## 2018-11-23 DIAGNOSIS — I130 Hypertensive heart and chronic kidney disease with heart failure and stage 1 through stage 4 chronic kidney disease, or unspecified chronic kidney disease: Secondary | ICD-10-CM | POA: Diagnosis not present

## 2018-11-23 DIAGNOSIS — I2510 Atherosclerotic heart disease of native coronary artery without angina pectoris: Secondary | ICD-10-CM | POA: Diagnosis not present

## 2018-11-23 DIAGNOSIS — I509 Heart failure, unspecified: Secondary | ICD-10-CM | POA: Diagnosis not present

## 2018-11-23 DIAGNOSIS — E1122 Type 2 diabetes mellitus with diabetic chronic kidney disease: Secondary | ICD-10-CM | POA: Diagnosis not present

## 2018-11-24 DIAGNOSIS — I509 Heart failure, unspecified: Secondary | ICD-10-CM | POA: Diagnosis not present

## 2018-11-24 DIAGNOSIS — I2510 Atherosclerotic heart disease of native coronary artery without angina pectoris: Secondary | ICD-10-CM | POA: Diagnosis not present

## 2018-11-24 DIAGNOSIS — S0083XD Contusion of other part of head, subsequent encounter: Secondary | ICD-10-CM | POA: Diagnosis not present

## 2018-11-24 DIAGNOSIS — E1122 Type 2 diabetes mellitus with diabetic chronic kidney disease: Secondary | ICD-10-CM | POA: Diagnosis not present

## 2018-11-24 DIAGNOSIS — S066X0D Traumatic subarachnoid hemorrhage without loss of consciousness, subsequent encounter: Secondary | ICD-10-CM | POA: Diagnosis not present

## 2018-11-24 DIAGNOSIS — I130 Hypertensive heart and chronic kidney disease with heart failure and stage 1 through stage 4 chronic kidney disease, or unspecified chronic kidney disease: Secondary | ICD-10-CM | POA: Diagnosis not present

## 2018-11-26 DIAGNOSIS — S0083XD Contusion of other part of head, subsequent encounter: Secondary | ICD-10-CM | POA: Diagnosis not present

## 2018-11-26 DIAGNOSIS — S066X0D Traumatic subarachnoid hemorrhage without loss of consciousness, subsequent encounter: Secondary | ICD-10-CM | POA: Diagnosis not present

## 2018-11-26 DIAGNOSIS — I130 Hypertensive heart and chronic kidney disease with heart failure and stage 1 through stage 4 chronic kidney disease, or unspecified chronic kidney disease: Secondary | ICD-10-CM | POA: Diagnosis not present

## 2018-11-26 DIAGNOSIS — I2510 Atherosclerotic heart disease of native coronary artery without angina pectoris: Secondary | ICD-10-CM | POA: Diagnosis not present

## 2018-11-26 DIAGNOSIS — I509 Heart failure, unspecified: Secondary | ICD-10-CM | POA: Diagnosis not present

## 2018-11-26 DIAGNOSIS — E1122 Type 2 diabetes mellitus with diabetic chronic kidney disease: Secondary | ICD-10-CM | POA: Diagnosis not present

## 2018-11-28 DIAGNOSIS — S0083XD Contusion of other part of head, subsequent encounter: Secondary | ICD-10-CM | POA: Diagnosis not present

## 2018-11-28 DIAGNOSIS — I130 Hypertensive heart and chronic kidney disease with heart failure and stage 1 through stage 4 chronic kidney disease, or unspecified chronic kidney disease: Secondary | ICD-10-CM | POA: Diagnosis not present

## 2018-11-28 DIAGNOSIS — I2510 Atherosclerotic heart disease of native coronary artery without angina pectoris: Secondary | ICD-10-CM | POA: Diagnosis not present

## 2018-11-28 DIAGNOSIS — S066X0D Traumatic subarachnoid hemorrhage without loss of consciousness, subsequent encounter: Secondary | ICD-10-CM | POA: Diagnosis not present

## 2018-11-28 DIAGNOSIS — I509 Heart failure, unspecified: Secondary | ICD-10-CM | POA: Diagnosis not present

## 2018-11-28 DIAGNOSIS — E1122 Type 2 diabetes mellitus with diabetic chronic kidney disease: Secondary | ICD-10-CM | POA: Diagnosis not present

## 2018-12-03 ENCOUNTER — Ambulatory Visit: Payer: Medicare Other | Admitting: Osteopathic Medicine

## 2018-12-03 ENCOUNTER — Telehealth: Payer: Self-pay | Admitting: Osteopathic Medicine

## 2018-12-03 DIAGNOSIS — E1122 Type 2 diabetes mellitus with diabetic chronic kidney disease: Secondary | ICD-10-CM | POA: Diagnosis not present

## 2018-12-03 DIAGNOSIS — S0083XD Contusion of other part of head, subsequent encounter: Secondary | ICD-10-CM | POA: Diagnosis not present

## 2018-12-03 DIAGNOSIS — I509 Heart failure, unspecified: Secondary | ICD-10-CM | POA: Diagnosis not present

## 2018-12-03 DIAGNOSIS — I2510 Atherosclerotic heart disease of native coronary artery without angina pectoris: Secondary | ICD-10-CM | POA: Diagnosis not present

## 2018-12-03 DIAGNOSIS — I130 Hypertensive heart and chronic kidney disease with heart failure and stage 1 through stage 4 chronic kidney disease, or unspecified chronic kidney disease: Secondary | ICD-10-CM | POA: Diagnosis not present

## 2018-12-03 DIAGNOSIS — S066X0D Traumatic subarachnoid hemorrhage without loss of consciousness, subsequent encounter: Secondary | ICD-10-CM | POA: Diagnosis not present

## 2018-12-03 NOTE — Telephone Encounter (Signed)
Spoke with Pt's daughter, rescheduled for tomorrow morning.

## 2018-12-03 NOTE — Telephone Encounter (Signed)
Patient missed appointment this morning for suture removal, can we call and make sure everything is okay?  Can reschedule her for later this afternoon at end of day, I have an open slot at 4:20

## 2018-12-04 ENCOUNTER — Ambulatory Visit (INDEPENDENT_AMBULATORY_CARE_PROVIDER_SITE_OTHER): Payer: Medicare Other | Admitting: Osteopathic Medicine

## 2018-12-04 ENCOUNTER — Encounter: Payer: Self-pay | Admitting: Osteopathic Medicine

## 2018-12-04 VITALS — BP 71/40 | HR 77 | Temp 97.7°F | Wt 133.8 lb

## 2018-12-04 DIAGNOSIS — S0083XD Contusion of other part of head, subsequent encounter: Secondary | ICD-10-CM | POA: Diagnosis not present

## 2018-12-04 DIAGNOSIS — E1122 Type 2 diabetes mellitus with diabetic chronic kidney disease: Secondary | ICD-10-CM | POA: Diagnosis not present

## 2018-12-04 DIAGNOSIS — S066X0D Traumatic subarachnoid hemorrhage without loss of consciousness, subsequent encounter: Secondary | ICD-10-CM | POA: Diagnosis not present

## 2018-12-04 DIAGNOSIS — I509 Heart failure, unspecified: Secondary | ICD-10-CM | POA: Diagnosis not present

## 2018-12-04 DIAGNOSIS — I2510 Atherosclerotic heart disease of native coronary artery without angina pectoris: Secondary | ICD-10-CM | POA: Diagnosis not present

## 2018-12-04 DIAGNOSIS — T148XXA Other injury of unspecified body region, initial encounter: Secondary | ICD-10-CM

## 2018-12-04 DIAGNOSIS — I130 Hypertensive heart and chronic kidney disease with heart failure and stage 1 through stage 4 chronic kidney disease, or unspecified chronic kidney disease: Secondary | ICD-10-CM | POA: Diagnosis not present

## 2018-12-04 NOTE — Progress Notes (Signed)
S: s/p revision of wound and multi-layer closure - here today for superficial suture removal, no other concerns O: wound healing well, small areawhere dissolvable suture is visible but this appears ok and coming together underneath - would just as soon leave this alone, see A/p: A/P: Sutures removed without difficulty  Patient Instructions   Wound looks ok  There is one spot not quite coming together, there is some dissolvable suture slightly visible - don't worry about this, I'd rather leave it be and just keep clean and covered.  OK to use Vaseline under band-aid   Let's recheck in another week to make sure everything is coming together ok.

## 2018-12-04 NOTE — Patient Instructions (Signed)
   Wound looks ok  There is one spot not quite coming together, there is some dissolvable suture slightly visible - don't worry about this, I'd rather leave it be and just keep clean and covered.  OK to use Vaseline under band-aid   Let's recheck in another week to make sure everything is coming together ok.

## 2018-12-05 DIAGNOSIS — I2510 Atherosclerotic heart disease of native coronary artery without angina pectoris: Secondary | ICD-10-CM | POA: Diagnosis not present

## 2018-12-05 DIAGNOSIS — I130 Hypertensive heart and chronic kidney disease with heart failure and stage 1 through stage 4 chronic kidney disease, or unspecified chronic kidney disease: Secondary | ICD-10-CM | POA: Diagnosis not present

## 2018-12-05 DIAGNOSIS — E1122 Type 2 diabetes mellitus with diabetic chronic kidney disease: Secondary | ICD-10-CM | POA: Diagnosis not present

## 2018-12-05 DIAGNOSIS — S0083XD Contusion of other part of head, subsequent encounter: Secondary | ICD-10-CM | POA: Diagnosis not present

## 2018-12-05 DIAGNOSIS — I509 Heart failure, unspecified: Secondary | ICD-10-CM | POA: Diagnosis not present

## 2018-12-05 DIAGNOSIS — S066X0D Traumatic subarachnoid hemorrhage without loss of consciousness, subsequent encounter: Secondary | ICD-10-CM | POA: Diagnosis not present

## 2018-12-06 DIAGNOSIS — I509 Heart failure, unspecified: Secondary | ICD-10-CM | POA: Diagnosis not present

## 2018-12-06 DIAGNOSIS — E1122 Type 2 diabetes mellitus with diabetic chronic kidney disease: Secondary | ICD-10-CM | POA: Diagnosis not present

## 2018-12-06 DIAGNOSIS — S066X0D Traumatic subarachnoid hemorrhage without loss of consciousness, subsequent encounter: Secondary | ICD-10-CM | POA: Diagnosis not present

## 2018-12-06 DIAGNOSIS — S0083XD Contusion of other part of head, subsequent encounter: Secondary | ICD-10-CM | POA: Diagnosis not present

## 2018-12-06 DIAGNOSIS — I2510 Atherosclerotic heart disease of native coronary artery without angina pectoris: Secondary | ICD-10-CM | POA: Diagnosis not present

## 2018-12-06 DIAGNOSIS — I130 Hypertensive heart and chronic kidney disease with heart failure and stage 1 through stage 4 chronic kidney disease, or unspecified chronic kidney disease: Secondary | ICD-10-CM | POA: Diagnosis not present

## 2018-12-07 ENCOUNTER — Other Ambulatory Visit: Payer: Self-pay | Admitting: Osteopathic Medicine

## 2018-12-16 DIAGNOSIS — F0390 Unspecified dementia without behavioral disturbance: Secondary | ICD-10-CM | POA: Diagnosis not present

## 2018-12-16 DIAGNOSIS — N183 Chronic kidney disease, stage 3 (moderate): Secondary | ICD-10-CM | POA: Diagnosis not present

## 2018-12-16 DIAGNOSIS — I4891 Unspecified atrial fibrillation: Secondary | ICD-10-CM | POA: Diagnosis not present

## 2018-12-16 DIAGNOSIS — E1122 Type 2 diabetes mellitus with diabetic chronic kidney disease: Secondary | ICD-10-CM | POA: Diagnosis not present

## 2018-12-16 DIAGNOSIS — I130 Hypertensive heart and chronic kidney disease with heart failure and stage 1 through stage 4 chronic kidney disease, or unspecified chronic kidney disease: Secondary | ICD-10-CM | POA: Diagnosis not present

## 2018-12-16 DIAGNOSIS — S066X0D Traumatic subarachnoid hemorrhage without loss of consciousness, subsequent encounter: Secondary | ICD-10-CM | POA: Diagnosis not present

## 2018-12-16 DIAGNOSIS — S0083XD Contusion of other part of head, subsequent encounter: Secondary | ICD-10-CM | POA: Diagnosis not present

## 2018-12-16 DIAGNOSIS — I2510 Atherosclerotic heart disease of native coronary artery without angina pectoris: Secondary | ICD-10-CM | POA: Diagnosis not present

## 2018-12-16 DIAGNOSIS — I509 Heart failure, unspecified: Secondary | ICD-10-CM | POA: Diagnosis not present

## 2018-12-19 DIAGNOSIS — I509 Heart failure, unspecified: Secondary | ICD-10-CM | POA: Diagnosis not present

## 2018-12-19 DIAGNOSIS — E1122 Type 2 diabetes mellitus with diabetic chronic kidney disease: Secondary | ICD-10-CM | POA: Diagnosis not present

## 2018-12-19 DIAGNOSIS — S066X0D Traumatic subarachnoid hemorrhage without loss of consciousness, subsequent encounter: Secondary | ICD-10-CM | POA: Diagnosis not present

## 2018-12-19 DIAGNOSIS — I130 Hypertensive heart and chronic kidney disease with heart failure and stage 1 through stage 4 chronic kidney disease, or unspecified chronic kidney disease: Secondary | ICD-10-CM | POA: Diagnosis not present

## 2018-12-19 DIAGNOSIS — I2510 Atherosclerotic heart disease of native coronary artery without angina pectoris: Secondary | ICD-10-CM | POA: Diagnosis not present

## 2018-12-19 DIAGNOSIS — S0083XD Contusion of other part of head, subsequent encounter: Secondary | ICD-10-CM | POA: Diagnosis not present

## 2019-01-17 ENCOUNTER — Ambulatory Visit: Payer: Medicare Other | Admitting: Osteopathic Medicine

## 2019-01-26 DIAGNOSIS — R195 Other fecal abnormalities: Secondary | ICD-10-CM | POA: Diagnosis not present

## 2019-01-26 DIAGNOSIS — K5730 Diverticulosis of large intestine without perforation or abscess without bleeding: Secondary | ICD-10-CM | POA: Diagnosis not present

## 2019-01-26 DIAGNOSIS — R1084 Generalized abdominal pain: Secondary | ICD-10-CM | POA: Diagnosis not present

## 2019-01-26 DIAGNOSIS — K5900 Constipation, unspecified: Secondary | ICD-10-CM | POA: Diagnosis not present

## 2019-01-26 DIAGNOSIS — K625 Hemorrhage of anus and rectum: Secondary | ICD-10-CM | POA: Diagnosis not present

## 2019-02-22 DIAGNOSIS — H6123 Impacted cerumen, bilateral: Secondary | ICD-10-CM | POA: Diagnosis not present

## 2019-02-22 DIAGNOSIS — H903 Sensorineural hearing loss, bilateral: Secondary | ICD-10-CM | POA: Diagnosis not present

## 2019-02-22 DIAGNOSIS — H838X3 Other specified diseases of inner ear, bilateral: Secondary | ICD-10-CM | POA: Diagnosis not present

## 2019-03-05 ENCOUNTER — Other Ambulatory Visit: Payer: Self-pay

## 2019-03-05 ENCOUNTER — Encounter: Payer: Self-pay | Admitting: Osteopathic Medicine

## 2019-03-05 ENCOUNTER — Ambulatory Visit (INDEPENDENT_AMBULATORY_CARE_PROVIDER_SITE_OTHER): Payer: Medicare Other | Admitting: Osteopathic Medicine

## 2019-03-05 VITALS — BP 190/69 | HR 90 | Temp 98.1°F | Wt 129.0 lb

## 2019-03-05 DIAGNOSIS — Z658 Other specified problems related to psychosocial circumstances: Secondary | ICD-10-CM
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
## 2019-03-10 ENCOUNTER — Other Ambulatory Visit: Payer: Self-pay | Admitting: Osteopathic Medicine

## 2019-03-10 NOTE — Telephone Encounter (Signed)
Please advise 

## 2019-03-11 NOTE — Telephone Encounter (Signed)
[facility] requesting med refill for lorazepam. Rx not listed in active med list.

## 2019-03-20 ENCOUNTER — Telehealth: Payer: Self-pay | Admitting: Osteopathic Medicine

## 2019-03-20 NOTE — Telephone Encounter (Signed)
Patients Daughter requested a place to put her mom in.  "The situation is getting out of hand."  Please Advise.

## 2019-04-16 ENCOUNTER — Ambulatory Visit (INDEPENDENT_AMBULATORY_CARE_PROVIDER_SITE_OTHER): Payer: Medicare Other | Admitting: Osteopathic Medicine

## 2019-04-16 ENCOUNTER — Encounter: Payer: Self-pay | Admitting: Osteopathic Medicine

## 2019-04-16 VITALS — BP 203/110 | HR 72 | Temp 97.6°F | Wt 128.7 lb

## 2019-04-16 DIAGNOSIS — R296 Repeated falls: Secondary | ICD-10-CM | POA: Diagnosis not present

## 2019-04-16 DIAGNOSIS — F0391 Unspecified dementia with behavioral disturbance: Secondary | ICD-10-CM

## 2019-04-16 DIAGNOSIS — R0989 Other specified symptoms and signs involving the circulatory and respiratory systems: Secondary | ICD-10-CM | POA: Diagnosis not present

## 2019-04-16 DIAGNOSIS — F418 Other specified anxiety disorders: Secondary | ICD-10-CM | POA: Diagnosis not present

## 2019-04-16 DIAGNOSIS — Z658 Other specified problems related to psychosocial circumstances: Secondary | ICD-10-CM

## 2019-04-16 NOTE — Patient Instructions (Signed)
Please let me know when you've decided on a facility and I can get to work on the necessary paperwork / gathering records!   Monitor BP and keep me posted if consistently high or low.

## 2019-04-17 ENCOUNTER — Encounter: Payer: Self-pay | Admitting: Osteopathic Medicine
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
## 2019-04-22 ENCOUNTER — Other Ambulatory Visit: Payer: Self-pay

## 2019-04-22 ENCOUNTER — Other Ambulatory Visit: Payer: Self-pay | Admitting: Osteopathic Medicine

## 2019-04-22 NOTE — Telephone Encounter (Signed)
Please advise 

## 2019-05-04 ENCOUNTER — Other Ambulatory Visit: Payer: Self-pay | Admitting: Osteopathic Medicine

## 2019-05-22 ENCOUNTER — Other Ambulatory Visit: Payer: Self-pay

## 2019-05-22 ENCOUNTER — Telehealth: Payer: Self-pay

## 2019-05-22 MED ORDER — LORAZEPAM 0.5 MG PO TABS: 0.2500 mg | ORAL_TABLET | Freq: Every evening | ORAL | 0 refills | Status: DC | PRN

## 2019-05-22 MED ORDER — CARVEDILOL 3.125 MG PO TABS: 3.1250 mg | ORAL_TABLET | Freq: Two times a day (BID) | ORAL | 3 refills | Status: DC

## 2019-05-22 MED ORDER — NYSTATIN 100000 UNIT/GM EX POWD: Freq: Four times a day (QID) | CUTANEOUS | 1 refills | Status: DC

## 2019-05-22 NOTE — Telephone Encounter (Signed)
Sent!

## 2019-05-22 NOTE — Telephone Encounter (Signed)
Gae Bon stopped by the front desk earlier today requesting lorazepam med refill for pt. Pls send to [facility] in [place].

## 2019-05-22 NOTE — Telephone Encounter (Signed)
Left a detailed vm msg for Gae Bon (pt's daughter) regarding med refill. Direct call back info provided.

## 2019-06-04 ENCOUNTER — Ambulatory Visit (INDEPENDENT_AMBULATORY_CARE_PROVIDER_SITE_OTHER): Payer: Medicare Other | Admitting: Osteopathic Medicine

## 2019-06-04 ENCOUNTER — Encounter: Payer: Self-pay | Admitting: Osteopathic Medicine

## 2019-06-04 ENCOUNTER — Other Ambulatory Visit: Payer: Self-pay

## 2019-06-04 VITALS — BP 147/54 | HR 68 | Temp 98.4°F | Wt 128.4 lb

## 2019-06-04 DIAGNOSIS — R296 Repeated falls: Secondary | ICD-10-CM | POA: Diagnosis not present

## 2019-06-04 DIAGNOSIS — F418 Other specified anxiety disorders: Secondary | ICD-10-CM | POA: Diagnosis not present

## 2019-06-04 DIAGNOSIS — R0989 Other specified symptoms and signs involving the circulatory and respiratory systems: Secondary | ICD-10-CM

## 2019-06-04 NOTE — Patient Instructions (Signed)
Will send refills to [facility] Will see if they can do pill packs to make things easy for you!   Will send referral/order for physical therapy to Mr. Hedy Jacob office

## 2019-06-04 NOTE — Progress Notes (Signed)
HPI: Bethany Park is a 85 y.o. female who  has a past medical history of Anemia, Aortic stenosis, Atrial fibrillation ([place]), B12 deficiency (03/13/2018), Colon polyp (09/2013), Dementia ([place]), Depression with anxiety (03/13/2018), Essential hypertension (03/13/2018), Glucose intolerance (03/13/2018), Heart attack ([place]), Heart failure ([place]), History of COPD, History of coronary artery stent placement (09/2017), Moderate aortic stenosis (03/13/2018), Osteoporosis (03/13/2018), Ovarian cancer ([place]), Restless leg syndrome (03/13/2018), and Stroke ([place]).  she presents to [facility] today, 06/04/19,  for chief complaint of:  Med refills  Doing well on current medications, needs refills and was told to schedule an appointment.    Currently, daughter is managing medications by placing in pill bottles, patient is upset about this and wants to take medications herself.  She is currently in an assisted living facility which is working out much better for her and for her daughter.  Concerned about falling, gait disturbance.  Patient and her daughter would like to get physical therapy set up in the home.     At today's visit 06/04/19 ... PMH, PSH, FH reviewed and updated as needed.  Current medication list and allergy/intolerance hx reviewed and updated as needed. (See remainder of HPI, ROS, Phys Exam below)   No results found.  No results found for this or any previous visit (from the past 72 hour(s)).        ASSESSMENT/PLAN: The primary encounter diagnosis was Labile blood pressure. Diagnoses of Depression with anxiety and Frequent falls were also pertinent to this visit.   No orders of the defined types were placed in this encounter.    Meds ordered this encounter  Medications  . atorvastatin (LIPITOR) 40 MG tablet    Sig: Take 1 tablet (40 mg total) by mouth daily.    Dispense:  90 tablet    Refill:  3  . clopidogrel (PLAVIX) 75 MG tablet    Sig: Take  1 tablet (75 mg total) by mouth daily.    Dispense:  90 tablet    Refill:  3  . lisinopril (ZESTRIL) 2.5 MG tablet    Sig: Take 1 tablet (2.5 mg total) by mouth daily.    Dispense:  90 tablet    Refill:  3  . LORazepam (ATIVAN) 0.5 MG tablet    Sig: Take 0.5-1 tablets (0.25-0.5 mg total) by mouth at bedtime as needed for anxiety or sleep.    Dispense:  30 tablet    Refill:  0  . rOPINIRole (REQUIP) 3 MG tablet    Sig: Take 1 tablet (3 mg total) by mouth 3 (three) times daily.    Dispense:  270 tablet    Refill:  3  . AMBULATORY NON FORMULARY MEDICATION    Sig: Physical therapy eval and treat for dx: ambulatory dysfunction, charcot marie tooth syndrome, frequent falls. Fax attn: [facility]sey [phone]    Dispense:  1 Units    Refill:  99    Patient Instructions  Will send refills to [facility] Will see if they can do pill packs to make things easy for you!   Will send referral/order for physical therapy to Mr. Hedy Jacob office       Follow-up plan: Return in about 3 months (around 09/04/2019) for routine check-up chronic medical issues (virtual or in-office is ok) - see me sooner if needed! .                                                 ################################################# ################################################# ################################################# #################################################  Current Meds  Medication Sig  . albuterol (PROVENTIL HFA;VENTOLIN HFA) 108 (90 Base) MCG/ACT inhaler Inhale 2 puffs into the lungs every 6 (six) hours as needed for wheezing.  . [facility] bed with rails  . AMBULATORY NON FORMULARY MEDICATION Wheelchair  . amitriptyline (ELAVIL) 25 MG tablet Take 1 tablet (25 mg total) by mouth at bedtime.  Marland Kitchen atorvastatin (LIPITOR) 40 MG tablet Take 1 tablet (40 mg total) by mouth daily.  . carvedilol (COREG) 3.125 MG  tablet Take 1 tablet (3.125 mg total) by mouth 2 (two) times daily with a meal.  . clopidogrel (PLAVIX) 75 MG tablet Take 1 tablet (75 mg total) by mouth daily.  . famotidine (PEPCID) 40 MG tablet Take 1 tablet (40 mg total) by mouth daily.  Marland Kitchen HYDROcodone-acetaminophen (NORCO) 5-325 MG tablet Take 1 tablet by mouth every 6 (six) hours as needed for severe pain. #45 for 30 days  . lisinopril (ZESTRIL) 2.5 MG tablet Take 1 tablet (2.5 mg total) by mouth daily.  Marland Kitchen LORazepam (ATIVAN) 0.5 MG tablet Take 0.5-1 tablets (0.25-0.5 mg total) by mouth at bedtime as needed for anxiety or sleep.  Marland Kitchen lubiprostone (AMITIZA) 8 MCG capsule Take 1 capsule (8 mcg total) by mouth 2 (two) times daily with a meal. Start day after next bowel movement  . Melatonin 10 MG SUBL Place 10 mg under the tongue at bedtime.  . nitroGLYCERIN (NITROSTAT) 0.4 MG SL tablet Place 1 tablet (0.4 mg total) under the tongue every 5 (five) minutes as needed for chest pain. If no better 10-15 mins, go to hospital  . nystatin (MYCOSTATIN/NYSTOP) powder Apply topically 4 (four) times daily.  Marland Kitchen rOPINIRole (REQUIP) 3 MG tablet Take 1 tablet (3 mg total) by mouth 3 (three) times daily.  Marland Kitchen umeclidinium-vilanterol (ANORO ELLIPTA) 62.5-25 MCG/INH AEPB Inhale 1 puff into the lungs daily.  . [DISCONTINUED] atorvastatin (LIPITOR) 40 MG tablet Take 1 tablet by mouth once daily  . [DISCONTINUED] clopidogrel (PLAVIX) 75 MG tablet Take 1 tablet (75 mg total) by mouth daily.  . [DISCONTINUED] lisinopril (PRINIVIL,ZESTRIL) 2.5 MG tablet Take 1 tablet (2.5 mg total) by mouth daily.  . [DISCONTINUED] LORazepam (ATIVAN) 0.5 MG tablet Take 0.5-1 tablets (0.25-0.5 mg total) by mouth at bedtime as needed for anxiety or sleep.  . [DISCONTINUED] rOPINIRole (REQUIP) 3 MG tablet TAKE 1 TABLET BY MOUTH THREE TIMES DAILY    Allergies  Allergen Reactions  . Alendronate Sodium Other (See Comments)  . Benztropine Other (See Comments)  . Citalopram Other (See Comments)   . Duloxetine Hcl Other (See Comments)  . Erythromycin Other (See Comments)  . Fluoxetine Hcl Other (See Comments)  . Glimepiride Other (See Comments)  . Nortriptyline Hcl Other (See Comments)  . Penicillins Other (See Comments)  . Seroquel [Quetiapine Fumarate] Other (See Comments)    As per daughter, hallucinations.       Review of Systems:  Constitutional: No recent illness  HEENT: No  headache, no vision change  Cardiac: No  chest pain, No  pressure, No palpitations  Respiratory:  No  shortness of breath. No  Cough  Gastrointestinal: No  abdominal pain  Musculoskeletal: No new myalgia/arthralgia  Neurologic: No  weakness, No  Dizziness   Exam:  BP (!) 147/54 (BP Location: Left Arm, Patient Position: Sitting, Cuff Size: Normal)   Pulse 68   Temp 98.4 F (36.9 C) (Oral)   Wt 128 lb 6.4 oz (58.2 kg)   BMI 19.52 kg/m   Constitutional: VS  see above. General Appearance: alert, well-developed, well-nourished, NAD  Eyes: Normal lids and conjunctive, non-icteric sclera  Neck: No masses, trachea midline.   Respiratory: Normal respiratory effort. no wheeze, no rhonchi, no rales  Cardiovascular: S1/S2 normal, +murmur, no rub/gallop auscultated. RRR.   Musculoskeletal: Gait normal. Symmetric and independent movement of all extremities  Neurological: Normal balance/coordination. No tremor.  Skin: warm, dry, intact.   Psychiatric: Poor judgment/insight. Annoyed mood and normal affect. Oriented x3.       Visit summary with medication list and pertinent instructions was printed for patient to review, patient was advised to alert [place] if any updates are needed. All questions at time of visit were answered - patient instructed to contact office with any additional concerns. ER/RTC precautions were reviewed with the patient and understanding verbalized.   Note: Total time spent 25 minutes, greater than 50% of the visit was spent face-to-face counseling and coordinating  care for the following: The primary encounter diagnosis was Labile blood pressure. Diagnoses of Depression with anxiety and Frequent falls were also pertinent to this visit.Marland Kitchen  Please note: voice recognition software was used to produce this document, and typos may escape review. Please contact Dr. Sheppard Coil for any needed clarifications.    Follow up plan: Return in about 3 months (around 09/04/2019) for routine check-up chronic medical issues (virtual or in-office is ok) - see me sooner if needed! Marland Kitchen

## 2019-06-05 ENCOUNTER — Other Ambulatory Visit: Payer: Self-pay | Admitting: Osteopathic Medicine

## 2019-06-05 ENCOUNTER — Telehealth: Payer: Self-pay

## 2019-06-05 ENCOUNTER — Other Ambulatory Visit: Payer: Self-pay | Admitting: Cardiology

## 2019-06-05 MED ORDER — ATORVASTATIN CALCIUM 40 MG PO TABS: 40.0000 mg | ORAL_TABLET | Freq: Every day | ORAL | 3 refills | Status: DC

## 2019-06-05 MED ORDER — LORAZEPAM 0.5 MG PO TABS: 0.2500 mg | ORAL_TABLET | Freq: Every evening | ORAL | 0 refills | Status: DC | PRN

## 2019-06-05 MED ORDER — CLOPIDOGREL BISULFATE 75 MG PO TABS: 75.0000 mg | ORAL_TABLET | Freq: Every day | ORAL | 3 refills | Status: DC

## 2019-06-05 MED ORDER — AMBULATORY NON FORMULARY MEDICATION: 99 refills | Status: AC

## 2019-06-05 MED ORDER — ROPINIROLE HCL 3 MG PO TABS: 3.0000 mg | ORAL_TABLET | Freq: Three times a day (TID) | ORAL | 3 refills | Status: DC

## 2019-06-05 MED ORDER — LISINOPRIL 2.5 MG PO TABS: 2.5000 mg | ORAL_TABLET | Freq: Every day | ORAL | 3 refills | Status: DC

## 2019-06-05 NOTE — Telephone Encounter (Signed)
Does she need me to send short term supply to local pharmacy?  Not sure what they mean about power of attorney for refills, I've never heard of this... maybe POA so patients family can request refills? Seems crazy to me. Can she clarify?

## 2019-06-05 NOTE — Telephone Encounter (Signed)
Gae Bon left a vm msg stating she is having trouble with pt's refills sent to [place]. Mail order called requesting verification of pt's current med list. Gae Bon was then informed that m/o is unable to fill "old rxs" and will need to complete Power of attorney paperwork. Pt will be out of her medications. She is unsure of how to proceed. Requesting recommendation from provider. Pls advise, thanks.

## 2019-06-06 NOTE — Telephone Encounter (Signed)
All has been taken care of.

## 2019-06-07 ENCOUNTER — Telehealth: Payer: Self-pay

## 2019-06-07 DIAGNOSIS — G600 Hereditary motor and sensory neuropathy: Secondary | ICD-10-CM

## 2019-06-07 DIAGNOSIS — R5381 Other malaise: Secondary | ICD-10-CM

## 2019-06-07 NOTE — Telephone Encounter (Signed)
Joe from [facility] called requesting a referral for physical therapy evaluation for pt. Pls advise, thanks.

## 2019-06-09 ENCOUNTER — Other Ambulatory Visit: Payer: Self-pay | Admitting: Osteopathic Medicine

## 2019-06-10 NOTE — Telephone Encounter (Signed)
Fine with me! Will they accept verbal order?

## 2019-06-10 NOTE — Addendum Note (Signed)
Addended by: Maryla Morrow on: 06/10/2019 05:04 PM   Modules accepted: Orders

## 2019-06-10 NOTE — Telephone Encounter (Signed)
Unable to accept verbal order. Needs referral placed. Thanks.

## 2019-06-10 NOTE — Telephone Encounter (Signed)
Ok, cindy sent as Juluis Rainier

## 2019-06-17 ENCOUNTER — Telehealth: Payer: Self-pay | Admitting: Osteopathic Medicine

## 2019-06-17 DIAGNOSIS — F0390 Unspecified dementia without behavioral disturbance: Secondary | ICD-10-CM | POA: Diagnosis not present

## 2019-06-17 DIAGNOSIS — Z7722 Contact with and (suspected) exposure to environmental tobacco smoke (acute) (chronic): Secondary | ICD-10-CM | POA: Diagnosis not present

## 2019-06-17 DIAGNOSIS — R4182 Altered mental status, unspecified: Secondary | ICD-10-CM | POA: Diagnosis not present

## 2019-06-17 DIAGNOSIS — R9431 Abnormal electrocardiogram [ECG] [EKG]: Secondary | ICD-10-CM | POA: Diagnosis not present

## 2019-06-17 DIAGNOSIS — M47812 Spondylosis without myelopathy or radiculopathy, cervical region: Secondary | ICD-10-CM | POA: Diagnosis not present

## 2019-06-17 DIAGNOSIS — M4322 Fusion of spine, cervical region: Secondary | ICD-10-CM | POA: Diagnosis not present

## 2019-06-17 DIAGNOSIS — G9389 Other specified disorders of brain: Secondary | ICD-10-CM | POA: Diagnosis not present

## 2019-06-17 DIAGNOSIS — M1712 Unilateral primary osteoarthritis, left knee: Secondary | ICD-10-CM | POA: Diagnosis not present

## 2019-06-17 DIAGNOSIS — N3000 Acute cystitis without hematuria: Secondary | ICD-10-CM | POA: Diagnosis not present

## 2019-06-17 DIAGNOSIS — M25462 Effusion, left knee: Secondary | ICD-10-CM | POA: Diagnosis not present

## 2019-06-17 DIAGNOSIS — M19072 Primary osteoarthritis, left ankle and foot: Secondary | ICD-10-CM | POA: Diagnosis not present

## 2019-06-17 DIAGNOSIS — N183 Chronic kidney disease, stage 3 (moderate): Secondary | ICD-10-CM | POA: Diagnosis not present

## 2019-06-17 DIAGNOSIS — M25572 Pain in left ankle and joints of left foot: Secondary | ICD-10-CM | POA: Diagnosis not present

## 2019-06-17 DIAGNOSIS — R58 Hemorrhage, not elsewhere classified: Secondary | ICD-10-CM | POA: Diagnosis not present

## 2019-06-17 DIAGNOSIS — M11262 Other chondrocalcinosis, left knee: Secondary | ICD-10-CM | POA: Diagnosis not present

## 2019-06-17 DIAGNOSIS — I10 Essential (primary) hypertension: Secondary | ICD-10-CM | POA: Diagnosis not present

## 2019-06-17 DIAGNOSIS — I69398 Other sequelae of cerebral infarction: Secondary | ICD-10-CM | POA: Diagnosis not present

## 2019-06-17 DIAGNOSIS — I444 Left anterior fascicular block: Secondary | ICD-10-CM | POA: Diagnosis not present

## 2019-06-17 DIAGNOSIS — S90512A Abrasion, left ankle, initial encounter: Secondary | ICD-10-CM | POA: Diagnosis not present

## 2019-06-17 DIAGNOSIS — W19XXXA Unspecified fall, initial encounter: Secondary | ICD-10-CM | POA: Diagnosis not present

## 2019-06-17 DIAGNOSIS — F29 Unspecified psychosis not due to a substance or known physiological condition: Secondary | ICD-10-CM | POA: Diagnosis not present

## 2019-06-17 DIAGNOSIS — M7732 Calcaneal spur, left foot: Secondary | ICD-10-CM | POA: Diagnosis not present

## 2019-06-17 DIAGNOSIS — I130 Hypertensive heart and chronic kidney disease with heart failure and stage 1 through stage 4 chronic kidney disease, or unspecified chronic kidney disease: Secondary | ICD-10-CM | POA: Diagnosis not present

## 2019-06-17 DIAGNOSIS — D72829 Elevated white blood cell count, unspecified: Secondary | ICD-10-CM | POA: Diagnosis not present

## 2019-06-17 DIAGNOSIS — M25562 Pain in left knee: Secondary | ICD-10-CM | POA: Diagnosis not present

## 2019-06-17 DIAGNOSIS — R410 Disorientation, unspecified: Secondary | ICD-10-CM | POA: Diagnosis not present

## 2019-06-17 DIAGNOSIS — S80212A Abrasion, left knee, initial encounter: Secondary | ICD-10-CM | POA: Diagnosis not present

## 2019-06-17 DIAGNOSIS — I509 Heart failure, unspecified: Secondary | ICD-10-CM | POA: Diagnosis not present

## 2019-06-17 DIAGNOSIS — J320 Chronic maxillary sinusitis: Secondary | ICD-10-CM | POA: Diagnosis not present

## 2019-06-17 NOTE — Telephone Encounter (Signed)
As per pt's request and her daughter Gae Bon - 1 yr of lisinopril sent to [facility] 1.5 week ago. Call must have been from pt's son-in-law. Forwarding msg to provider as an Pharmacist, hospital. Thanks for the update.

## 2019-06-17 NOTE — Telephone Encounter (Signed)
1. PT needs a refill on Lisinopril. Please send to [facility] in [place].    2. Her assisted living Found her walking in the woods. She was admitted to [facility] in [place]. Call them if you need more information. [phone]  This information was given by her husband.   Please Advise.

## 2019-06-18 DIAGNOSIS — D539 Nutritional anemia, unspecified: Secondary | ICD-10-CM | POA: Diagnosis not present

## 2019-06-18 DIAGNOSIS — R4182 Altered mental status, unspecified: Secondary | ICD-10-CM | POA: Diagnosis not present

## 2019-06-18 DIAGNOSIS — I509 Heart failure, unspecified: Secondary | ICD-10-CM | POA: Diagnosis not present

## 2019-06-18 DIAGNOSIS — N3000 Acute cystitis without hematuria: Secondary | ICD-10-CM | POA: Diagnosis not present

## 2019-06-18 DIAGNOSIS — D72829 Elevated white blood cell count, unspecified: Secondary | ICD-10-CM | POA: Diagnosis not present

## 2019-06-18 DIAGNOSIS — R7989 Other specified abnormal findings of blood chemistry: Secondary | ICD-10-CM | POA: Diagnosis not present

## 2019-06-18 DIAGNOSIS — F0390 Unspecified dementia without behavioral disturbance: Secondary | ICD-10-CM | POA: Diagnosis not present

## 2019-06-18 DIAGNOSIS — N183 Chronic kidney disease, stage 3 (moderate): Secondary | ICD-10-CM | POA: Diagnosis not present

## 2019-06-18 DIAGNOSIS — R443 Hallucinations, unspecified: Secondary | ICD-10-CM | POA: Diagnosis not present

## 2019-06-18 DIAGNOSIS — R410 Disorientation, unspecified: Secondary | ICD-10-CM | POA: Diagnosis not present

## 2019-06-18 DIAGNOSIS — I130 Hypertensive heart and chronic kidney disease with heart failure and stage 1 through stage 4 chronic kidney disease, or unspecified chronic kidney disease: Secondary | ICD-10-CM | POA: Diagnosis not present

## 2019-06-18 DIAGNOSIS — I480 Paroxysmal atrial fibrillation: Secondary | ICD-10-CM | POA: Diagnosis not present

## 2019-06-19 DIAGNOSIS — M47812 Spondylosis without myelopathy or radiculopathy, cervical region: Secondary | ICD-10-CM | POA: Diagnosis present

## 2019-06-19 DIAGNOSIS — I350 Nonrheumatic aortic (valve) stenosis: Secondary | ICD-10-CM | POA: Diagnosis not present

## 2019-06-19 DIAGNOSIS — J449 Chronic obstructive pulmonary disease, unspecified: Secondary | ICD-10-CM | POA: Diagnosis present

## 2019-06-19 DIAGNOSIS — R4182 Altered mental status, unspecified: Secondary | ICD-10-CM | POA: Diagnosis not present

## 2019-06-19 DIAGNOSIS — B9620 Unspecified Escherichia coli [E. coli] as the cause of diseases classified elsewhere: Secondary | ICD-10-CM | POA: Diagnosis not present

## 2019-06-19 DIAGNOSIS — I2510 Atherosclerotic heart disease of native coronary artery without angina pectoris: Secondary | ICD-10-CM | POA: Diagnosis present

## 2019-06-19 DIAGNOSIS — N183 Chronic kidney disease, stage 3 (moderate): Secondary | ICD-10-CM | POA: Diagnosis present

## 2019-06-19 DIAGNOSIS — N3000 Acute cystitis without hematuria: Secondary | ICD-10-CM | POA: Diagnosis present

## 2019-06-19 DIAGNOSIS — I10 Essential (primary) hypertension: Secondary | ICD-10-CM | POA: Diagnosis not present

## 2019-06-19 DIAGNOSIS — Z955 Presence of coronary angioplasty implant and graft: Secondary | ICD-10-CM | POA: Diagnosis not present

## 2019-06-19 DIAGNOSIS — Z8673 Personal history of transient ischemic attack (TIA), and cerebral infarction without residual deficits: Secondary | ICD-10-CM | POA: Diagnosis not present

## 2019-06-19 DIAGNOSIS — M7989 Other specified soft tissue disorders: Secondary | ICD-10-CM | POA: Diagnosis not present

## 2019-06-19 DIAGNOSIS — R410 Disorientation, unspecified: Secondary | ICD-10-CM | POA: Diagnosis not present

## 2019-06-19 DIAGNOSIS — M4802 Spinal stenosis, cervical region: Secondary | ICD-10-CM | POA: Diagnosis present

## 2019-06-19 DIAGNOSIS — N390 Urinary tract infection, site not specified: Secondary | ICD-10-CM | POA: Diagnosis not present

## 2019-06-19 DIAGNOSIS — F419 Anxiety disorder, unspecified: Secondary | ICD-10-CM | POA: Diagnosis present

## 2019-06-19 DIAGNOSIS — F0390 Unspecified dementia without behavioral disturbance: Secondary | ICD-10-CM | POA: Diagnosis present

## 2019-06-19 DIAGNOSIS — I130 Hypertensive heart and chronic kidney disease with heart failure and stage 1 through stage 4 chronic kidney disease, or unspecified chronic kidney disease: Secondary | ICD-10-CM | POA: Diagnosis present

## 2019-06-19 DIAGNOSIS — N179 Acute kidney failure, unspecified: Secondary | ICD-10-CM | POA: Diagnosis present

## 2019-06-19 DIAGNOSIS — Z743 Need for continuous supervision: Secondary | ICD-10-CM | POA: Diagnosis not present

## 2019-06-19 DIAGNOSIS — Z20828 Contact with and (suspected) exposure to other viral communicable diseases: Secondary | ICD-10-CM | POA: Diagnosis present

## 2019-06-19 DIAGNOSIS — Z8543 Personal history of malignant neoplasm of ovary: Secondary | ICD-10-CM | POA: Diagnosis not present

## 2019-06-19 DIAGNOSIS — F0391 Unspecified dementia with behavioral disturbance: Secondary | ICD-10-CM | POA: Diagnosis not present

## 2019-06-19 DIAGNOSIS — T796XXS Traumatic ischemia of muscle, sequela: Secondary | ICD-10-CM | POA: Diagnosis not present

## 2019-06-19 DIAGNOSIS — G2581 Restless legs syndrome: Secondary | ICD-10-CM | POA: Diagnosis present

## 2019-06-19 DIAGNOSIS — A419 Sepsis, unspecified organism: Secondary | ICD-10-CM | POA: Diagnosis present

## 2019-06-19 DIAGNOSIS — I480 Paroxysmal atrial fibrillation: Secondary | ICD-10-CM | POA: Diagnosis present

## 2019-06-19 DIAGNOSIS — K219 Gastro-esophageal reflux disease without esophagitis: Secondary | ICD-10-CM | POA: Diagnosis present

## 2019-06-19 DIAGNOSIS — M6282 Rhabdomyolysis: Secondary | ICD-10-CM | POA: Diagnosis present

## 2019-06-19 DIAGNOSIS — Z66 Do not resuscitate: Secondary | ICD-10-CM | POA: Diagnosis present

## 2019-06-19 DIAGNOSIS — G9341 Metabolic encephalopathy: Secondary | ICD-10-CM | POA: Diagnosis present

## 2019-06-19 DIAGNOSIS — T796XXA Traumatic ischemia of muscle, initial encounter: Secondary | ICD-10-CM | POA: Diagnosis not present

## 2019-06-19 DIAGNOSIS — I252 Old myocardial infarction: Secondary | ICD-10-CM | POA: Diagnosis not present

## 2019-06-19 DIAGNOSIS — E871 Hypo-osmolality and hyponatremia: Secondary | ICD-10-CM | POA: Diagnosis present

## 2019-06-19 DIAGNOSIS — F05 Delirium due to known physiological condition: Secondary | ICD-10-CM | POA: Diagnosis present

## 2019-06-19 DIAGNOSIS — I509 Heart failure, unspecified: Secondary | ICD-10-CM | POA: Diagnosis not present

## 2019-06-19 DIAGNOSIS — N3090 Cystitis, unspecified without hematuria: Secondary | ICD-10-CM | POA: Diagnosis not present

## 2019-06-19 DIAGNOSIS — Z7409 Other reduced mobility: Secondary | ICD-10-CM | POA: Diagnosis not present

## 2019-06-19 DIAGNOSIS — L89151 Pressure ulcer of sacral region, stage 1: Secondary | ICD-10-CM | POA: Diagnosis present

## 2019-06-19 DIAGNOSIS — Z789 Other specified health status: Secondary | ICD-10-CM | POA: Diagnosis not present

## 2019-06-24 DIAGNOSIS — D631 Anemia in chronic kidney disease: Secondary | ICD-10-CM | POA: Diagnosis present

## 2019-06-24 DIAGNOSIS — Z7722 Contact with and (suspected) exposure to environmental tobacco smoke (acute) (chronic): Secondary | ICD-10-CM | POA: Diagnosis not present

## 2019-06-24 DIAGNOSIS — R0902 Hypoxemia: Secondary | ICD-10-CM | POA: Diagnosis not present

## 2019-06-24 DIAGNOSIS — N183 Chronic kidney disease, stage 3 unspecified: Secondary | ICD-10-CM | POA: Diagnosis present

## 2019-06-24 DIAGNOSIS — K219 Gastro-esophageal reflux disease without esophagitis: Secondary | ICD-10-CM | POA: Diagnosis present

## 2019-06-24 DIAGNOSIS — R410 Disorientation, unspecified: Secondary | ICD-10-CM | POA: Diagnosis not present

## 2019-06-24 DIAGNOSIS — T796XXA Traumatic ischemia of muscle, initial encounter: Secondary | ICD-10-CM | POA: Diagnosis not present

## 2019-06-24 DIAGNOSIS — G4700 Insomnia, unspecified: Secondary | ICD-10-CM | POA: Diagnosis not present

## 2019-06-24 DIAGNOSIS — E222 Syndrome of inappropriate secretion of antidiuretic hormone: Secondary | ICD-10-CM | POA: Diagnosis present

## 2019-06-24 DIAGNOSIS — M26603 Bilateral temporomandibular joint disorder, unspecified: Secondary | ICD-10-CM | POA: Diagnosis not present

## 2019-06-24 DIAGNOSIS — R451 Restlessness and agitation: Secondary | ICD-10-CM | POA: Diagnosis not present

## 2019-06-24 DIAGNOSIS — R442 Other hallucinations: Secondary | ICD-10-CM | POA: Diagnosis not present

## 2019-06-24 DIAGNOSIS — R531 Weakness: Secondary | ICD-10-CM | POA: Diagnosis not present

## 2019-06-24 DIAGNOSIS — E871 Hypo-osmolality and hyponatremia: Secondary | ICD-10-CM | POA: Diagnosis not present

## 2019-06-24 DIAGNOSIS — G4730 Sleep apnea, unspecified: Secondary | ICD-10-CM | POA: Diagnosis present

## 2019-06-24 DIAGNOSIS — Z043 Encounter for examination and observation following other accident: Secondary | ICD-10-CM | POA: Diagnosis not present

## 2019-06-24 DIAGNOSIS — I130 Hypertensive heart and chronic kidney disease with heart failure and stage 1 through stage 4 chronic kidney disease, or unspecified chronic kidney disease: Secondary | ICD-10-CM | POA: Diagnosis present

## 2019-06-24 DIAGNOSIS — R51 Headache: Secondary | ICD-10-CM | POA: Diagnosis not present

## 2019-06-24 DIAGNOSIS — Z743 Need for continuous supervision: Secondary | ICD-10-CM | POA: Diagnosis not present

## 2019-06-24 DIAGNOSIS — G8190 Hemiplegia, unspecified affecting unspecified side: Secondary | ICD-10-CM | POA: Diagnosis not present

## 2019-06-24 DIAGNOSIS — I4891 Unspecified atrial fibrillation: Secondary | ICD-10-CM | POA: Diagnosis present

## 2019-06-24 DIAGNOSIS — R4182 Altered mental status, unspecified: Secondary | ICD-10-CM | POA: Diagnosis not present

## 2019-06-24 DIAGNOSIS — Z8673 Personal history of transient ischemic attack (TIA), and cerebral infarction without residual deficits: Secondary | ICD-10-CM | POA: Diagnosis not present

## 2019-06-24 DIAGNOSIS — D509 Iron deficiency anemia, unspecified: Secondary | ICD-10-CM | POA: Diagnosis not present

## 2019-06-24 DIAGNOSIS — Z7409 Other reduced mobility: Secondary | ICD-10-CM | POA: Diagnosis not present

## 2019-06-24 DIAGNOSIS — M810 Age-related osteoporosis without current pathological fracture: Secondary | ICD-10-CM | POA: Diagnosis present

## 2019-06-24 DIAGNOSIS — E538 Deficiency of other specified B group vitamins: Secondary | ICD-10-CM | POA: Diagnosis present

## 2019-06-24 DIAGNOSIS — Z789 Other specified health status: Secondary | ICD-10-CM | POA: Diagnosis not present

## 2019-06-24 DIAGNOSIS — J449 Chronic obstructive pulmonary disease, unspecified: Secondary | ICD-10-CM | POA: Diagnosis present

## 2019-06-24 DIAGNOSIS — R918 Other nonspecific abnormal finding of lung field: Secondary | ICD-10-CM | POA: Diagnosis not present

## 2019-06-24 DIAGNOSIS — R4789 Other speech disturbances: Secondary | ICD-10-CM | POA: Diagnosis not present

## 2019-06-24 DIAGNOSIS — T796XXS Traumatic ischemia of muscle, sequela: Secondary | ICD-10-CM | POA: Diagnosis not present

## 2019-06-24 DIAGNOSIS — F05 Delirium due to known physiological condition: Secondary | ICD-10-CM | POA: Diagnosis present

## 2019-06-24 DIAGNOSIS — H3530 Unspecified macular degeneration: Secondary | ICD-10-CM | POA: Diagnosis present

## 2019-06-24 DIAGNOSIS — Y92129 Unspecified place in nursing home as the place of occurrence of the external cause: Secondary | ICD-10-CM | POA: Diagnosis not present

## 2019-06-24 DIAGNOSIS — J9811 Atelectasis: Secondary | ICD-10-CM | POA: Diagnosis not present

## 2019-06-24 DIAGNOSIS — R21 Rash and other nonspecific skin eruption: Secondary | ICD-10-CM | POA: Diagnosis not present

## 2019-06-24 DIAGNOSIS — I509 Heart failure, unspecified: Secondary | ICD-10-CM | POA: Diagnosis not present

## 2019-06-24 DIAGNOSIS — I672 Cerebral atherosclerosis: Secondary | ICD-10-CM | POA: Diagnosis not present

## 2019-06-24 DIAGNOSIS — I110 Hypertensive heart disease with heart failure: Secondary | ICD-10-CM | POA: Diagnosis not present

## 2019-06-24 DIAGNOSIS — G9341 Metabolic encephalopathy: Secondary | ICD-10-CM | POA: Diagnosis not present

## 2019-06-24 DIAGNOSIS — G8929 Other chronic pain: Secondary | ICD-10-CM | POA: Diagnosis present

## 2019-06-24 DIAGNOSIS — Z03818 Encounter for observation for suspected exposure to other biological agents ruled out: Secondary | ICD-10-CM | POA: Diagnosis not present

## 2019-06-24 DIAGNOSIS — M545 Low back pain: Secondary | ICD-10-CM | POA: Diagnosis present

## 2019-06-24 DIAGNOSIS — E079 Disorder of thyroid, unspecified: Secondary | ICD-10-CM | POA: Diagnosis not present

## 2019-06-24 DIAGNOSIS — M5489 Other dorsalgia: Secondary | ICD-10-CM | POA: Diagnosis not present

## 2019-06-24 DIAGNOSIS — R441 Visual hallucinations: Secondary | ICD-10-CM | POA: Diagnosis not present

## 2019-06-24 DIAGNOSIS — A419 Sepsis, unspecified organism: Secondary | ICD-10-CM | POA: Diagnosis not present

## 2019-06-24 DIAGNOSIS — F419 Anxiety disorder, unspecified: Secondary | ICD-10-CM | POA: Diagnosis present

## 2019-06-24 DIAGNOSIS — I350 Nonrheumatic aortic (valve) stenosis: Secondary | ICD-10-CM | POA: Diagnosis not present

## 2019-06-24 DIAGNOSIS — M47812 Spondylosis without myelopathy or radiculopathy, cervical region: Secondary | ICD-10-CM | POA: Diagnosis not present

## 2019-06-24 DIAGNOSIS — W19XXXS Unspecified fall, sequela: Secondary | ICD-10-CM | POA: Diagnosis not present

## 2019-06-24 DIAGNOSIS — M6282 Rhabdomyolysis: Secondary | ICD-10-CM | POA: Diagnosis not present

## 2019-06-24 DIAGNOSIS — F0390 Unspecified dementia without behavioral disturbance: Secondary | ICD-10-CM | POA: Diagnosis present

## 2019-06-24 DIAGNOSIS — G2581 Restless legs syndrome: Secondary | ICD-10-CM | POA: Diagnosis present

## 2019-06-24 DIAGNOSIS — Y998 Other external cause status: Secondary | ICD-10-CM | POA: Diagnosis not present

## 2019-06-24 DIAGNOSIS — R4781 Slurred speech: Secondary | ICD-10-CM | POA: Diagnosis not present

## 2019-06-24 DIAGNOSIS — F0391 Unspecified dementia with behavioral disturbance: Secondary | ICD-10-CM | POA: Diagnosis not present

## 2019-06-24 DIAGNOSIS — I480 Paroxysmal atrial fibrillation: Secondary | ICD-10-CM | POA: Diagnosis not present

## 2019-06-24 DIAGNOSIS — R5381 Other malaise: Secondary | ICD-10-CM | POA: Diagnosis not present

## 2019-06-24 DIAGNOSIS — F418 Other specified anxiety disorders: Secondary | ICD-10-CM | POA: Diagnosis present

## 2019-06-24 DIAGNOSIS — C569 Malignant neoplasm of unspecified ovary: Secondary | ICD-10-CM | POA: Diagnosis present

## 2019-06-24 DIAGNOSIS — B9620 Unspecified Escherichia coli [E. coli] as the cause of diseases classified elsewhere: Secondary | ICD-10-CM | POA: Diagnosis not present

## 2019-06-24 DIAGNOSIS — G9340 Encephalopathy, unspecified: Secondary | ICD-10-CM | POA: Diagnosis present

## 2019-06-24 DIAGNOSIS — I10 Essential (primary) hypertension: Secondary | ICD-10-CM | POA: Diagnosis not present

## 2019-06-24 DIAGNOSIS — I6523 Occlusion and stenosis of bilateral carotid arteries: Secondary | ICD-10-CM | POA: Diagnosis not present

## 2019-06-24 DIAGNOSIS — W19XXXA Unspecified fall, initial encounter: Secondary | ICD-10-CM | POA: Diagnosis not present

## 2019-06-24 DIAGNOSIS — G9389 Other specified disorders of brain: Secondary | ICD-10-CM | POA: Diagnosis not present

## 2019-06-24 DIAGNOSIS — R52 Pain, unspecified: Secondary | ICD-10-CM | POA: Diagnosis not present

## 2019-06-24 DIAGNOSIS — N189 Chronic kidney disease, unspecified: Secondary | ICD-10-CM | POA: Diagnosis not present

## 2019-06-24 DIAGNOSIS — M4802 Spinal stenosis, cervical region: Secondary | ICD-10-CM | POA: Diagnosis not present

## 2019-06-24 DIAGNOSIS — I2510 Atherosclerotic heart disease of native coronary artery without angina pectoris: Secondary | ICD-10-CM | POA: Diagnosis present

## 2019-06-24 DIAGNOSIS — K5090 Crohn's disease, unspecified, without complications: Secondary | ICD-10-CM | POA: Diagnosis present

## 2019-06-24 DIAGNOSIS — E878 Other disorders of electrolyte and fluid balance, not elsewhere classified: Secondary | ICD-10-CM | POA: Diagnosis not present

## 2019-06-24 DIAGNOSIS — Z20828 Contact with and (suspected) exposure to other viral communicable diseases: Secondary | ICD-10-CM | POA: Diagnosis present

## 2019-06-24 DIAGNOSIS — E785 Hyperlipidemia, unspecified: Secondary | ICD-10-CM | POA: Diagnosis not present

## 2019-06-24 DIAGNOSIS — I5032 Chronic diastolic (congestive) heart failure: Secondary | ICD-10-CM | POA: Diagnosis present

## 2019-06-24 DIAGNOSIS — N390 Urinary tract infection, site not specified: Secondary | ICD-10-CM | POA: Diagnosis not present

## 2019-06-24 DIAGNOSIS — R9082 White matter disease, unspecified: Secondary | ICD-10-CM | POA: Diagnosis not present

## 2019-06-24 DIAGNOSIS — D638 Anemia in other chronic diseases classified elsewhere: Secondary | ICD-10-CM | POA: Diagnosis not present

## 2019-06-25 DIAGNOSIS — N390 Urinary tract infection, site not specified: Secondary | ICD-10-CM | POA: Diagnosis not present

## 2019-06-25 DIAGNOSIS — B9620 Unspecified Escherichia coli [E. coli] as the cause of diseases classified elsewhere: Secondary | ICD-10-CM | POA: Diagnosis not present

## 2019-06-25 DIAGNOSIS — A419 Sepsis, unspecified organism: Secondary | ICD-10-CM | POA: Diagnosis not present

## 2019-06-25 DIAGNOSIS — G9341 Metabolic encephalopathy: Secondary | ICD-10-CM | POA: Diagnosis not present

## 2019-06-27 DIAGNOSIS — N390 Urinary tract infection, site not specified: Secondary | ICD-10-CM | POA: Diagnosis not present

## 2019-06-27 DIAGNOSIS — T796XXS Traumatic ischemia of muscle, sequela: Secondary | ICD-10-CM | POA: Diagnosis not present

## 2019-06-27 DIAGNOSIS — W19XXXS Unspecified fall, sequela: Secondary | ICD-10-CM | POA: Diagnosis not present

## 2019-06-27 DIAGNOSIS — G9341 Metabolic encephalopathy: Secondary | ICD-10-CM | POA: Diagnosis not present

## 2019-06-30 DIAGNOSIS — G9389 Other specified disorders of brain: Secondary | ICD-10-CM | POA: Diagnosis not present

## 2019-06-30 DIAGNOSIS — Y92129 Unspecified place in nursing home as the place of occurrence of the external cause: Secondary | ICD-10-CM | POA: Diagnosis not present

## 2019-06-30 DIAGNOSIS — R21 Rash and other nonspecific skin eruption: Secondary | ICD-10-CM | POA: Diagnosis not present

## 2019-06-30 DIAGNOSIS — W19XXXA Unspecified fall, initial encounter: Secondary | ICD-10-CM | POA: Diagnosis not present

## 2019-06-30 DIAGNOSIS — R51 Headache: Secondary | ICD-10-CM | POA: Diagnosis not present

## 2019-06-30 DIAGNOSIS — Z043 Encounter for examination and observation following other accident: Secondary | ICD-10-CM | POA: Diagnosis not present

## 2019-06-30 DIAGNOSIS — Y998 Other external cause status: Secondary | ICD-10-CM | POA: Diagnosis not present

## 2019-06-30 DIAGNOSIS — R4182 Altered mental status, unspecified: Secondary | ICD-10-CM | POA: Diagnosis not present

## 2019-06-30 DIAGNOSIS — R441 Visual hallucinations: Secondary | ICD-10-CM | POA: Diagnosis not present

## 2019-06-30 DIAGNOSIS — I672 Cerebral atherosclerosis: Secondary | ICD-10-CM | POA: Diagnosis not present

## 2019-07-01 ENCOUNTER — Telehealth: Payer: Self-pay | Admitting: Osteopathic Medicine

## 2019-07-01 DIAGNOSIS — Z03818 Encounter for observation for suspected exposure to other biological agents ruled out: Secondary | ICD-10-CM | POA: Diagnosis not present

## 2019-07-01 NOTE — Telephone Encounter (Signed)
Daughter called in about mother, stating that she wasn't happy with the care at the facility that she is at. She had fell, lost a lot of weight, incoherent, possible yeast infection, doesn't think she is being taken care of the way she should be. Would like to know if she needs to be seen in office or if Dr. Sheppard Coil can just speak to daughter over the phone for plan of care. Please Advise

## 2019-07-02 ENCOUNTER — Ambulatory Visit (INDEPENDENT_AMBULATORY_CARE_PROVIDER_SITE_OTHER): Payer: Medicare Other | Admitting: Osteopathic Medicine

## 2019-07-02 ENCOUNTER — Encounter: Payer: Self-pay | Admitting: Osteopathic Medicine

## 2019-07-02 DIAGNOSIS — R5381 Other malaise: Secondary | ICD-10-CM | POA: Diagnosis not present

## 2019-07-02 DIAGNOSIS — D638 Anemia in other chronic diseases classified elsewhere: Secondary | ICD-10-CM | POA: Diagnosis not present

## 2019-07-02 DIAGNOSIS — Z789 Other specified health status: Secondary | ICD-10-CM

## 2019-07-02 DIAGNOSIS — N189 Chronic kidney disease, unspecified: Secondary | ICD-10-CM | POA: Diagnosis not present

## 2019-07-02 NOTE — Progress Notes (Signed)
Virtual Visit via Phone  I connected with      Bethany Park on 07/02/19 at 10:36 AM by a telemedicine application and verified that I am speaking with the correct person using two identifiers.  Patient is at home I am in office    I discussed the limitations of evaluation and management by telemedicine and the availability of in person appointments. The patient expressed understanding and agreed to proceed.  History of Present Illness: Bethany Park is a 85 y.o. female who would like to discuss weight change   Daughter is concerned that the care facility where Bethany Park is staying is not caring for her properly. She has noted some weight loss.   06/16/19 went out of her apartment without her walker. Went outside into the woods, fell down into a creek, laid in the creek all night. Apartment called daughter the next morning and she was taken to [facility] 06/17/19, was in ER for awile lying in her urine, was dirty from being in the woods. Was transferred to [facility] until 08/23. Treated for sepsis d/t UTI, selirium superimposed on dementia, rhabdomyolysis. Was "in and out" mentally the entire time. Was in rehab.   Was in ER 2 days ago. Multiple falls since hospitalization. Confused, talking about aliens, talking about wanting to see her dad, something about lions and cougars, paranoid thoughts.   Daughter reports nasty rash. She is not so upset about the falling as she is about the untreated rashes. Daughter reports that there was no yeast powder or other indication of treatment.   Daughter found a couple places where this patient might be able to go.      Observations/Objective: There were no vitals taken for this visit. BP Readings from Last 3 Encounters:  06/04/19 (!) 147/54  04/16/19 (!) 203/110  03/05/19 (!) 190/69   Exam: Normal Speech.  NAD    Lab and Radiology Results No results found for this or any previous visit (from the past 72 hour(s)). No results  found.     Assessment and Plan: 85 y.o. female with The primary encounter diagnosis was Debility. A diagnosis of Resides in long term care facility was also pertinent to this visit.  Advised daughter to contact customer service number in the back of patient's Medicare card, they may be able to direct her to a facility that will be covered/walk her through the process of how to obtain approval.  Sounds like the patient was admitted inpatient for 3 days.  I would imagine that some weight loss is due to acute illness/muscle wasting.  Patient is very frail and has poor prognosis at any rate.  We will see what I can do from our end, perhaps [facility] referral?  PDMP not reviewed this encounter. No orders of the defined types were placed in this encounter.  No orders of the defined types were placed in this encounter.  There are no Patient Instructions on file for this visit.    Follow Up Instructions: Return if symptoms worsen or fail to improve.    I discussed the assessment and treatment plan with the patient. The patient was provided an opportunity to ask questions and all were answered. The patient agreed with the plan and demonstrated an understanding of the instructions.   The patient was advised to call back or seek an in-person evaluation if any new concerns, if symptoms worsen or if the condition fails to improve as anticipated.  30 minutes of non-face-to-face time was  provided during this encounter.                      Historical information moved to improve visibility of documentation.  Past Medical History:  Diagnosis Date  . Anemia   . Aortic stenosis   . Atrial fibrillation ([place])   . B12 deficiency 03/13/2018  . Colon polyp 09/2013   in [place] state.  for personal hx polyps and fm hx colon ca.  found small transverse polyp, diverticulosis, int hemorrhoids.    . Dementia ([place])   . Depression with anxiety 03/13/2018  . Essential hypertension 03/13/2018   . Glucose intolerance 03/13/2018  . Heart attack ([place])   . Heart failure ([place])   . History of COPD   . History of coronary artery stent placement 09/2017  . Moderate aortic stenosis 03/13/2018  . Osteoporosis 03/13/2018  . Ovarian cancer ([place])   . Restless leg syndrome 03/13/2018  . Stroke [facility])    Past Surgical History:  Procedure Laterality Date  . ABDOMINAL HYSTERECTOMY    . BACK SURGERY    . BREAST SURGERY    . CHOLECYSTECTOMY    . CORONARY STENT PLACEMENT    . ESOPHAGOGASTRODUODENOSCOPY N/A 04/16/2018   Procedure: ESOPHAGOGASTRODUODENOSCOPY (EGD);  Surgeon: Ladene Artist, MD;  Location: [facility] ENDOSCOPY;  Service: Endoscopy;  Laterality: N/A;  . EYE SURGERY    . HEMORROIDECTOMY    . HOT HEMOSTASIS N/A 04/16/2018   Procedure: HOT HEMOSTASIS (ARGON PLASMA COAGULATION/BICAP);  Surgeon: Ladene Artist, MD;  Location: [facility] ENDOSCOPY;  Service: Endoscopy;  Laterality: N/A;  . JOINT REPLACEMENT     Social History   Tobacco Use  . Smoking status: Never Smoker  . Smokeless tobacco: Never Used  Substance Use Topics  . Alcohol use: Not Currently   family history includes Colon cancer in her father; Diabetes Mellitus II in her mother; Heart disease in her mother; Stroke in her mother.  Medications: Current Outpatient Medications  Medication Sig Dispense Refill  . albuterol (PROVENTIL HFA;VENTOLIN HFA) 108 (90 Base) MCG/ACT inhaler Inhale 2 puffs into the lungs every 6 (six) hours as needed for wheezing. 2 Inhaler 11  . [facility] bed with rails 1 Units prn  . AMBULATORY NON FORMULARY MEDICATION Wheelchair 1 Units prn  . AMBULATORY NON FORMULARY MEDICATION Physical therapy eval and treat for dx: ambulatory dysfunction, charcot marie tooth syndrome, frequent falls. Fax attnManson Passey [phone] 1 Units 99  . amitriptyline (ELAVIL) 25 MG tablet Take 1 tablet (25 mg total) by mouth at bedtime. 90 tablet 0  . atorvastatin (LIPITOR) 40 MG tablet  Take 1 tablet (40 mg total) by mouth daily. 90 tablet 3  . carvedilol (COREG) 3.125 MG tablet Take 1 tablet (3.125 mg total) by mouth 2 (two) times daily with a meal. 180 tablet 3  . clopidogrel (PLAVIX) 75 MG tablet Take 1 tablet by mouth once daily 90 tablet 0  . famotidine (PEPCID) 40 MG tablet Take 1 tablet (40 mg total) by mouth daily. 30 tablet 0  . HYDROcodone-acetaminophen (NORCO) 5-325 MG tablet Take 1 tablet by mouth every 6 (six) hours as needed for severe pain. #45 for 30 days 45 tablet 0  . lisinopril (ZESTRIL) 2.5 MG tablet Take 1 tablet (2.5 mg total) by mouth daily. 90 tablet 3  . LORazepam (ATIVAN) 0.5 MG tablet Take 0.5-1 tablets (0.25-0.5 mg total) by mouth at bedtime as needed for anxiety or sleep. 30 tablet 0  . lubiprostone (  AMITIZA) 8 MCG capsule Take 1 capsule (8 mcg total) by mouth 2 (two) times daily with a meal. Start day after next bowel movement 60 capsule 1  . Melatonin 10 MG SUBL Place 10 mg under the tongue at bedtime. 90 tablet 3  . nitroGLYCERIN (NITROSTAT) 0.4 MG SL tablet Place 1 tablet (0.4 mg total) under the tongue every 5 (five) minutes as needed for chest pain. If no better 10-15 mins, go to hospital 20 tablet 1  . nystatin (MYCOSTATIN/NYSTOP) powder Apply topically 4 (four) times daily. 60 g 1  . rOPINIRole (REQUIP) 3 MG tablet Take 1 tablet (3 mg total) by mouth 3 (three) times daily. 270 tablet 3  . umeclidinium-vilanterol (ANORO ELLIPTA) 62.5-25 MCG/INH AEPB Inhale 1 puff into the lungs daily. 60 each 5  . gabapentin (NEURONTIN) 300 MG capsule Take 3 capsules (900 mg total) by mouth at bedtime. 270 capsule 1   No current facility-administered medications for this visit.    Allergies  Allergen Reactions  . Alendronate Sodium Other (See Comments)  . Benztropine Other (See Comments)  . Citalopram Other (See Comments)  . Duloxetine Hcl Other (See Comments)  . Erythromycin Other (See Comments)  . Fluoxetine Hcl Other (See Comments)  . Glimepiride Other  (See Comments)  . Nortriptyline Hcl Other (See Comments)  . Latex Other (See Comments)    Per daughter  . Penicillins Other (See Comments)  . Seroquel [Quetiapine Fumarate] Other (See Comments)    As per daughter, hallucinations.    PDMP not reviewed this encounter. No orders of the defined types were placed in this encounter.  No orders of the defined types were placed in this encounter.

## 2019-07-02 NOTE — Telephone Encounter (Signed)
Appointment has been made for today. No further questions at this time.

## 2019-07-02 NOTE — Telephone Encounter (Signed)
Can set up virtual visit for phone call or through doximity, I have a few openings today

## 2019-07-09 DIAGNOSIS — J449 Chronic obstructive pulmonary disease, unspecified: Secondary | ICD-10-CM | POA: Diagnosis not present

## 2019-07-09 DIAGNOSIS — F0391 Unspecified dementia with behavioral disturbance: Secondary | ICD-10-CM | POA: Diagnosis not present

## 2019-07-09 DIAGNOSIS — I480 Paroxysmal atrial fibrillation: Secondary | ICD-10-CM | POA: Diagnosis not present

## 2019-07-09 DIAGNOSIS — N189 Chronic kidney disease, unspecified: Secondary | ICD-10-CM | POA: Diagnosis not present

## 2019-07-16 DIAGNOSIS — R451 Restlessness and agitation: Secondary | ICD-10-CM | POA: Diagnosis not present

## 2019-07-16 DIAGNOSIS — F419 Anxiety disorder, unspecified: Secondary | ICD-10-CM | POA: Diagnosis not present

## 2019-07-16 DIAGNOSIS — F0391 Unspecified dementia with behavioral disturbance: Secondary | ICD-10-CM | POA: Diagnosis not present

## 2019-07-22 DIAGNOSIS — F419 Anxiety disorder, unspecified: Secondary | ICD-10-CM | POA: Diagnosis not present

## 2019-07-22 DIAGNOSIS — G4700 Insomnia, unspecified: Secondary | ICD-10-CM | POA: Diagnosis not present

## 2019-07-22 DIAGNOSIS — R451 Restlessness and agitation: Secondary | ICD-10-CM | POA: Diagnosis not present

## 2019-07-22 DIAGNOSIS — F0391 Unspecified dementia with behavioral disturbance: Secondary | ICD-10-CM | POA: Diagnosis not present

## 2019-07-23 ENCOUNTER — Telehealth: Payer: Self-pay

## 2019-07-23 NOTE — Telephone Encounter (Signed)
Ok can we go ahead and write this up. And once I view the letter we can give it to her right away. Please stat ethat Gae Bon is power of attorney with legal documentation of above patient. Pt's has dementia and not mentally competent to make decisions. Gae Bon should be allowed to make decisions and be consulted in the care of this patient.

## 2019-07-23 NOTE — Telephone Encounter (Signed)
Contacted Gae Bon - she did contact the lawyer. She was informed to follow what is indicated on the legal form. She states that the form indicates she will need a letter from the doctor so that she may continue to care for her mother. She will be stopping by this afternoon so that covering provider can review letter.

## 2019-07-23 NOTE — Telephone Encounter (Signed)
I need to see legal document. It should be notarized. If I see this or you take that to facility it should resolve any problems. If patient did not sign anything then nothing legally has been done and we will have to take another route.

## 2019-07-23 NOTE — Telephone Encounter (Signed)
Pt's daughter called - states that her mother has been residing in [facility] facility in [place]. Gae Bon is pt's "power of attorney" because pt suffers from severe dementia. As per Gae Bon, pt has informed the facility staff to remove her daughter as power of attorney. Since then, pt withdrew $1000 dollars from her acct, which she is prohibited from doing because of her mental state and has been Forensic psychologist. Gae Bon has been refrained from being able to stay in contact with the patient. The facility has informed Gae Bon she has no rights over the patient and is not permitted near pt. Gae Bon is at a lost, worry about her mother's safety. Unsure what to do. Requesting if provider can write a letter stating the she has been pt's power of attorney / caregiver since the decline of pt's health. Pls advise, thanks.

## 2019-07-23 NOTE — Telephone Encounter (Signed)
I am not comfortable with creating a letter for this situation. Routing back to provider. Thanks.

## 2019-07-23 NOTE — Telephone Encounter (Signed)
If letter is legit. She needs to talk with lawyer and demand rights. She could consider switching facilities if not compliant but she has rights if this was signed over to her. I would call lawyer first and they can tell you legally what she can do.

## 2019-07-23 NOTE — Telephone Encounter (Signed)
Gae Bon has been updated of covering provider's recommendation. She does have a notarized power of attorney letter on hand. She will stop by the office to have covering provider review document. She is really worried that someone is manipulating her mother and is worried about her overall state of mental health.

## 2019-07-24 DIAGNOSIS — J449 Chronic obstructive pulmonary disease, unspecified: Secondary | ICD-10-CM | POA: Diagnosis not present

## 2019-07-24 DIAGNOSIS — I480 Paroxysmal atrial fibrillation: Secondary | ICD-10-CM | POA: Diagnosis not present

## 2019-07-24 DIAGNOSIS — N189 Chronic kidney disease, unspecified: Secondary | ICD-10-CM | POA: Diagnosis not present

## 2019-07-24 DIAGNOSIS — F0391 Unspecified dementia with behavioral disturbance: Secondary | ICD-10-CM | POA: Diagnosis not present

## 2019-07-24 NOTE — Telephone Encounter (Signed)
I will place on desk to get to patient along with her power of attorney documents.

## 2019-07-24 NOTE — Telephone Encounter (Signed)
Pt's daughter Gae Bon was contacted. She will stop by the office today to p/u the letter and her documents. Aware to request me at the [place] desk so that I may directly give her the documents.   FYI - She mentioned she continues to have issues with the facility. She was informed that pt is being moved out earlier this morning. Pt has no other place to live at and there were difficulties that occurred when she was living with her daughter. She is worried about her mother's safety and states she is going to move forward with legal action against the facility.

## 2019-07-24 NOTE — Telephone Encounter (Signed)
Ok I agree if she feels her mother safety is at risk she should at the minimum have someone look into this.

## 2019-07-25 DIAGNOSIS — F0391 Unspecified dementia with behavioral disturbance: Secondary | ICD-10-CM | POA: Diagnosis not present

## 2019-07-25 DIAGNOSIS — N189 Chronic kidney disease, unspecified: Secondary | ICD-10-CM | POA: Diagnosis not present

## 2019-07-25 DIAGNOSIS — J449 Chronic obstructive pulmonary disease, unspecified: Secondary | ICD-10-CM | POA: Diagnosis not present

## 2019-07-27 DIAGNOSIS — M6281 Muscle weakness (generalized): Secondary | ICD-10-CM | POA: Diagnosis not present

## 2019-07-27 DIAGNOSIS — I69328 Other speech and language deficits following cerebral infarction: Secondary | ICD-10-CM | POA: Diagnosis not present

## 2019-07-27 DIAGNOSIS — Z743 Need for continuous supervision: Secondary | ICD-10-CM | POA: Diagnosis not present

## 2019-07-27 DIAGNOSIS — I110 Hypertensive heart disease with heart failure: Secondary | ICD-10-CM | POA: Diagnosis not present

## 2019-07-27 DIAGNOSIS — I517 Cardiomegaly: Secondary | ICD-10-CM | POA: Diagnosis not present

## 2019-07-27 DIAGNOSIS — F0390 Unspecified dementia without behavioral disturbance: Secondary | ICD-10-CM | POA: Diagnosis not present

## 2019-07-27 DIAGNOSIS — I519 Heart disease, unspecified: Secondary | ICD-10-CM | POA: Diagnosis not present

## 2019-07-27 DIAGNOSIS — H3530 Unspecified macular degeneration: Secondary | ICD-10-CM | POA: Diagnosis present

## 2019-07-27 DIAGNOSIS — Z8673 Personal history of transient ischemic attack (TIA), and cerebral infarction without residual deficits: Secondary | ICD-10-CM | POA: Diagnosis not present

## 2019-07-27 DIAGNOSIS — K219 Gastro-esophageal reflux disease without esophagitis: Secondary | ICD-10-CM | POA: Diagnosis present

## 2019-07-27 DIAGNOSIS — I130 Hypertensive heart and chronic kidney disease with heart failure and stage 1 through stage 4 chronic kidney disease, or unspecified chronic kidney disease: Secondary | ICD-10-CM | POA: Diagnosis not present

## 2019-07-27 DIAGNOSIS — D649 Anemia, unspecified: Secondary | ICD-10-CM | POA: Diagnosis not present

## 2019-07-27 DIAGNOSIS — G2581 Restless legs syndrome: Secondary | ICD-10-CM | POA: Diagnosis present

## 2019-07-27 DIAGNOSIS — I10 Essential (primary) hypertension: Secondary | ICD-10-CM | POA: Diagnosis not present

## 2019-07-27 DIAGNOSIS — N183 Chronic kidney disease, stage 3 unspecified: Secondary | ICD-10-CM | POA: Diagnosis present

## 2019-07-27 DIAGNOSIS — Z8659 Personal history of other mental and behavioral disorders: Secondary | ICD-10-CM | POA: Diagnosis not present

## 2019-07-27 DIAGNOSIS — N319 Neuromuscular dysfunction of bladder, unspecified: Secondary | ICD-10-CM | POA: Diagnosis not present

## 2019-07-27 DIAGNOSIS — Z20828 Contact with and (suspected) exposure to other viral communicable diseases: Secondary | ICD-10-CM | POA: Diagnosis not present

## 2019-07-27 DIAGNOSIS — R279 Unspecified lack of coordination: Secondary | ICD-10-CM | POA: Diagnosis not present

## 2019-07-27 DIAGNOSIS — I2510 Atherosclerotic heart disease of native coronary artery without angina pectoris: Secondary | ICD-10-CM | POA: Diagnosis not present

## 2019-07-27 DIAGNOSIS — R9082 White matter disease, unspecified: Secondary | ICD-10-CM | POA: Diagnosis not present

## 2019-07-27 DIAGNOSIS — Z8679 Personal history of other diseases of the circulatory system: Secondary | ICD-10-CM | POA: Diagnosis not present

## 2019-07-27 DIAGNOSIS — Z8719 Personal history of other diseases of the digestive system: Secondary | ICD-10-CM | POA: Diagnosis not present

## 2019-07-27 DIAGNOSIS — K5090 Crohn's disease, unspecified, without complications: Secondary | ICD-10-CM | POA: Diagnosis not present

## 2019-07-27 DIAGNOSIS — G8929 Other chronic pain: Secondary | ICD-10-CM | POA: Diagnosis present

## 2019-07-27 DIAGNOSIS — R4182 Altered mental status, unspecified: Secondary | ICD-10-CM | POA: Diagnosis not present

## 2019-07-27 DIAGNOSIS — F418 Other specified anxiety disorders: Secondary | ICD-10-CM | POA: Diagnosis present

## 2019-07-27 DIAGNOSIS — R410 Disorientation, unspecified: Secondary | ICD-10-CM | POA: Diagnosis not present

## 2019-07-27 DIAGNOSIS — F05 Delirium due to known physiological condition: Secondary | ICD-10-CM | POA: Diagnosis present

## 2019-07-27 DIAGNOSIS — E538 Deficiency of other specified B group vitamins: Secondary | ICD-10-CM | POA: Diagnosis present

## 2019-07-27 DIAGNOSIS — E222 Syndrome of inappropriate secretion of antidiuretic hormone: Secondary | ICD-10-CM | POA: Diagnosis present

## 2019-07-27 DIAGNOSIS — G8190 Hemiplegia, unspecified affecting unspecified side: Secondary | ICD-10-CM | POA: Diagnosis not present

## 2019-07-27 DIAGNOSIS — Z955 Presence of coronary angioplasty implant and graft: Secondary | ICD-10-CM | POA: Diagnosis not present

## 2019-07-27 DIAGNOSIS — M545 Low back pain: Secondary | ICD-10-CM | POA: Diagnosis present

## 2019-07-27 DIAGNOSIS — E440 Moderate protein-calorie malnutrition: Secondary | ICD-10-CM | POA: Diagnosis not present

## 2019-07-27 DIAGNOSIS — E871 Hypo-osmolality and hyponatremia: Secondary | ICD-10-CM | POA: Diagnosis not present

## 2019-07-27 DIAGNOSIS — G9340 Encephalopathy, unspecified: Secondary | ICD-10-CM | POA: Diagnosis present

## 2019-07-27 DIAGNOSIS — R918 Other nonspecific abnormal finding of lung field: Secondary | ICD-10-CM | POA: Diagnosis not present

## 2019-07-27 DIAGNOSIS — I083 Combined rheumatic disorders of mitral, aortic and tricuspid valves: Secondary | ICD-10-CM | POA: Diagnosis not present

## 2019-07-27 DIAGNOSIS — D509 Iron deficiency anemia, unspecified: Secondary | ICD-10-CM | POA: Diagnosis not present

## 2019-07-27 DIAGNOSIS — I5032 Chronic diastolic (congestive) heart failure: Secondary | ICD-10-CM | POA: Diagnosis not present

## 2019-07-27 DIAGNOSIS — I6782 Cerebral ischemia: Secondary | ICD-10-CM | POA: Diagnosis not present

## 2019-07-27 DIAGNOSIS — F419 Anxiety disorder, unspecified: Secondary | ICD-10-CM | POA: Diagnosis present

## 2019-07-27 DIAGNOSIS — C569 Malignant neoplasm of unspecified ovary: Secondary | ICD-10-CM | POA: Diagnosis not present

## 2019-07-27 DIAGNOSIS — R531 Weakness: Secondary | ICD-10-CM | POA: Diagnosis not present

## 2019-07-27 DIAGNOSIS — R4789 Other speech disturbances: Secondary | ICD-10-CM | POA: Diagnosis not present

## 2019-07-27 DIAGNOSIS — I509 Heart failure, unspecified: Secondary | ICD-10-CM | POA: Diagnosis not present

## 2019-07-27 DIAGNOSIS — R442 Other hallucinations: Secondary | ICD-10-CM | POA: Diagnosis not present

## 2019-07-27 DIAGNOSIS — G4730 Sleep apnea, unspecified: Secondary | ICD-10-CM | POA: Diagnosis present

## 2019-07-27 DIAGNOSIS — I129 Hypertensive chronic kidney disease with stage 1 through stage 4 chronic kidney disease, or unspecified chronic kidney disease: Secondary | ICD-10-CM | POA: Diagnosis not present

## 2019-07-27 DIAGNOSIS — I4891 Unspecified atrial fibrillation: Secondary | ICD-10-CM | POA: Diagnosis not present

## 2019-07-27 DIAGNOSIS — D631 Anemia in chronic kidney disease: Secondary | ICD-10-CM | POA: Diagnosis present

## 2019-07-27 DIAGNOSIS — J449 Chronic obstructive pulmonary disease, unspecified: Secondary | ICD-10-CM | POA: Diagnosis not present

## 2019-07-27 DIAGNOSIS — R4781 Slurred speech: Secondary | ICD-10-CM | POA: Diagnosis not present

## 2019-07-27 DIAGNOSIS — E878 Other disorders of electrolyte and fluid balance, not elsewhere classified: Secondary | ICD-10-CM | POA: Diagnosis not present

## 2019-07-27 DIAGNOSIS — J9811 Atelectasis: Secondary | ICD-10-CM | POA: Diagnosis not present

## 2019-07-27 DIAGNOSIS — I350 Nonrheumatic aortic (valve) stenosis: Secondary | ICD-10-CM | POA: Diagnosis not present

## 2019-07-27 DIAGNOSIS — F339 Major depressive disorder, recurrent, unspecified: Secondary | ICD-10-CM | POA: Diagnosis not present

## 2019-07-27 DIAGNOSIS — E785 Hyperlipidemia, unspecified: Secondary | ICD-10-CM | POA: Diagnosis not present

## 2019-07-27 DIAGNOSIS — M810 Age-related osteoporosis without current pathological fracture: Secondary | ICD-10-CM | POA: Diagnosis present

## 2019-07-27 DIAGNOSIS — R0902 Hypoxemia: Secondary | ICD-10-CM | POA: Diagnosis not present

## 2019-08-07 DIAGNOSIS — Z8679 Personal history of other diseases of the circulatory system: Secondary | ICD-10-CM | POA: Diagnosis not present

## 2019-08-07 DIAGNOSIS — E222 Syndrome of inappropriate secretion of antidiuretic hormone: Secondary | ICD-10-CM | POA: Diagnosis not present

## 2019-08-07 DIAGNOSIS — G4730 Sleep apnea, unspecified: Secondary | ICD-10-CM | POA: Diagnosis not present

## 2019-08-07 DIAGNOSIS — M810 Age-related osteoporosis without current pathological fracture: Secondary | ICD-10-CM | POA: Diagnosis not present

## 2019-08-07 DIAGNOSIS — E538 Deficiency of other specified B group vitamins: Secondary | ICD-10-CM | POA: Diagnosis not present

## 2019-08-07 DIAGNOSIS — G2581 Restless legs syndrome: Secondary | ICD-10-CM | POA: Diagnosis not present

## 2019-08-07 DIAGNOSIS — I4891 Unspecified atrial fibrillation: Secondary | ICD-10-CM | POA: Diagnosis not present

## 2019-08-07 DIAGNOSIS — G9340 Encephalopathy, unspecified: Secondary | ICD-10-CM | POA: Diagnosis not present

## 2019-08-07 DIAGNOSIS — E440 Moderate protein-calorie malnutrition: Secondary | ICD-10-CM | POA: Diagnosis not present

## 2019-08-07 DIAGNOSIS — E871 Hypo-osmolality and hyponatremia: Secondary | ICD-10-CM | POA: Diagnosis not present

## 2019-08-07 DIAGNOSIS — I2510 Atherosclerotic heart disease of native coronary artery without angina pectoris: Secondary | ICD-10-CM | POA: Diagnosis not present

## 2019-08-07 DIAGNOSIS — R442 Other hallucinations: Secondary | ICD-10-CM | POA: Diagnosis not present

## 2019-08-07 DIAGNOSIS — F419 Anxiety disorder, unspecified: Secondary | ICD-10-CM | POA: Diagnosis not present

## 2019-08-07 DIAGNOSIS — I350 Nonrheumatic aortic (valve) stenosis: Secondary | ICD-10-CM | POA: Diagnosis not present

## 2019-08-07 DIAGNOSIS — Z955 Presence of coronary angioplasty implant and graft: Secondary | ICD-10-CM | POA: Diagnosis not present

## 2019-08-07 DIAGNOSIS — Z8719 Personal history of other diseases of the digestive system: Secondary | ICD-10-CM | POA: Diagnosis not present

## 2019-08-07 DIAGNOSIS — J449 Chronic obstructive pulmonary disease, unspecified: Secondary | ICD-10-CM | POA: Diagnosis not present

## 2019-08-07 DIAGNOSIS — F0390 Unspecified dementia without behavioral disturbance: Secondary | ICD-10-CM | POA: Diagnosis not present

## 2019-08-07 DIAGNOSIS — F418 Other specified anxiety disorders: Secondary | ICD-10-CM | POA: Diagnosis not present

## 2019-08-07 DIAGNOSIS — M6281 Muscle weakness (generalized): Secondary | ICD-10-CM | POA: Diagnosis not present

## 2019-08-07 DIAGNOSIS — K219 Gastro-esophageal reflux disease without esophagitis: Secondary | ICD-10-CM | POA: Diagnosis not present

## 2019-08-07 DIAGNOSIS — I69328 Other speech and language deficits following cerebral infarction: Secondary | ICD-10-CM | POA: Diagnosis not present

## 2019-08-07 DIAGNOSIS — F339 Major depressive disorder, recurrent, unspecified: Secondary | ICD-10-CM | POA: Diagnosis not present

## 2019-08-07 DIAGNOSIS — E782 Mixed hyperlipidemia: Secondary | ICD-10-CM | POA: Diagnosis not present

## 2019-08-07 DIAGNOSIS — R279 Unspecified lack of coordination: Secondary | ICD-10-CM | POA: Diagnosis not present

## 2019-08-07 DIAGNOSIS — R4781 Slurred speech: Secondary | ICD-10-CM | POA: Diagnosis not present

## 2019-08-07 DIAGNOSIS — N183 Chronic kidney disease, stage 3 unspecified: Secondary | ICD-10-CM | POA: Diagnosis not present

## 2019-08-07 DIAGNOSIS — D649 Anemia, unspecified: Secondary | ICD-10-CM | POA: Diagnosis not present

## 2019-08-07 DIAGNOSIS — Z8659 Personal history of other mental and behavioral disorders: Secondary | ICD-10-CM | POA: Diagnosis not present

## 2019-08-07 DIAGNOSIS — Z743 Need for continuous supervision: Secondary | ICD-10-CM | POA: Diagnosis not present

## 2019-08-07 DIAGNOSIS — N319 Neuromuscular dysfunction of bladder, unspecified: Secondary | ICD-10-CM | POA: Diagnosis not present

## 2019-08-07 DIAGNOSIS — I10 Essential (primary) hypertension: Secondary | ICD-10-CM | POA: Diagnosis not present

## 2019-08-08 MED ORDER — NITROGLYCERIN 0.4 MG SL SUBL
0.40 | SUBLINGUAL_TABLET | SUBLINGUAL | Status: DC
Start: ? — End: 2019-08-08

## 2019-08-08 MED ORDER — GABAPENTIN 300 MG PO CAPS
600.00 | ORAL_CAPSULE | ORAL | Status: DC
Start: 2019-08-07 — End: 2019-08-08

## 2019-08-08 MED ORDER — CLOPIDOGREL BISULFATE 75 MG PO TABS
75.00 | ORAL_TABLET | ORAL | Status: DC
Start: 2019-08-08 — End: 2019-08-08

## 2019-08-08 MED ORDER — VITAMIN D3 25 MCG (1000 UNIT) PO TABS
1000.00 | ORAL_TABLET | ORAL | Status: DC
Start: 2019-08-08 — End: 2019-08-08

## 2019-08-08 MED ORDER — POLYVINYL ALCOHOL 1.4 % OP SOLN
1.00 | OPHTHALMIC | Status: DC
Start: ? — End: 2019-08-08

## 2019-08-08 MED ORDER — SENNA 8.6 MG PO TABS
1.00 | ORAL_TABLET | ORAL | Status: DC
Start: 2019-08-07 — End: 2019-08-08

## 2019-08-08 MED ORDER — ACETAMINOPHEN 325 MG PO TABS
650.00 | ORAL_TABLET | ORAL | Status: DC
Start: ? — End: 2019-08-08

## 2019-08-08 MED ORDER — ENOXAPARIN SODIUM 40 MG/0.4ML ~~LOC~~ SOLN
40.00 | SUBCUTANEOUS | Status: DC
Start: 2019-08-08 — End: 2019-08-08

## 2019-08-08 MED ORDER — HYDROCODONE-ACETAMINOPHEN 5-325 MG PO TABS
1.00 | ORAL_TABLET | ORAL | Status: DC
Start: ? — End: 2019-08-08

## 2019-08-08 MED ORDER — GENERIC EXTERNAL MEDICATION
Status: DC
Start: ? — End: 2019-08-08

## 2019-08-08 MED ORDER — FERROUS SULFATE 325 (65 FE) MG PO TABS
325.00 | ORAL_TABLET | ORAL | Status: DC
Start: 2019-08-08 — End: 2019-08-08

## 2019-08-08 MED ORDER — VITAMIN B-12 1000 MCG PO TABS
1000.00 | ORAL_TABLET | ORAL | Status: DC
Start: 2019-08-08 — End: 2019-08-08

## 2019-08-08 MED ORDER — PANTOPRAZOLE SODIUM 40 MG PO TBEC
40.00 | DELAYED_RELEASE_TABLET | ORAL | Status: DC
Start: 2019-08-08 — End: 2019-08-08

## 2019-08-08 MED ORDER — ATORVASTATIN CALCIUM 40 MG PO TABS
40.00 | ORAL_TABLET | ORAL | Status: DC
Start: 2019-08-07 — End: 2019-08-08

## 2019-08-08 MED ORDER — HYDRALAZINE HCL 10 MG PO TABS
10.00 | ORAL_TABLET | ORAL | Status: DC
Start: ? — End: 2019-08-08

## 2019-08-08 MED ORDER — GABAPENTIN 300 MG PO CAPS
300.00 | ORAL_CAPSULE | ORAL | Status: DC
Start: 2019-08-08 — End: 2019-08-08

## 2019-08-08 MED ORDER — ACETAMINOPHEN 650 MG RE SUPP
650.00 | RECTAL | Status: DC
Start: ? — End: 2019-08-08

## 2019-08-08 MED ORDER — CARVEDILOL 3.125 MG PO TABS
3.13 | ORAL_TABLET | ORAL | Status: DC
Start: 2019-08-08 — End: 2019-08-08

## 2019-08-08 MED ORDER — GENERIC EXTERNAL MEDICATION
10.00 | Status: DC
Start: 2019-08-07 — End: 2019-08-08

## 2019-08-08 MED ORDER — ENALAPRILAT 1.25 MG/ML IV INJ
1.25 | INJECTION | INTRAVENOUS | Status: DC
Start: ? — End: 2019-08-08

## 2019-08-08 MED ORDER — TRIAMINIC COUGH/SORE THROAT PO
15.00 | ORAL | Status: DC
Start: 2019-08-07 — End: 2019-08-08

## 2019-08-08 MED ORDER — SODIUM CHLORIDE 0.9 % IV SOLN
10.00 | INTRAVENOUS | Status: DC
Start: ? — End: 2019-08-08

## 2019-08-08 MED ORDER — LISINOPRIL 20 MG PO TABS
20.00 | ORAL_TABLET | ORAL | Status: DC
Start: 2019-08-08 — End: 2019-08-08

## 2019-08-08 MED ORDER — ROPINIROLE HCL 1 MG PO TABS
3.00 | ORAL_TABLET | ORAL | Status: DC
Start: 2019-08-07 — End: 2019-08-08

## 2019-08-09 DIAGNOSIS — J449 Chronic obstructive pulmonary disease, unspecified: Secondary | ICD-10-CM | POA: Diagnosis not present

## 2019-08-09 DIAGNOSIS — I2510 Atherosclerotic heart disease of native coronary artery without angina pectoris: Secondary | ICD-10-CM | POA: Diagnosis not present

## 2019-08-09 DIAGNOSIS — K219 Gastro-esophageal reflux disease without esophagitis: Secondary | ICD-10-CM | POA: Diagnosis not present

## 2019-08-09 DIAGNOSIS — I4891 Unspecified atrial fibrillation: Secondary | ICD-10-CM | POA: Diagnosis not present

## 2019-08-09 DIAGNOSIS — F0390 Unspecified dementia without behavioral disturbance: Secondary | ICD-10-CM | POA: Diagnosis not present

## 2019-08-09 DIAGNOSIS — I10 Essential (primary) hypertension: Secondary | ICD-10-CM | POA: Diagnosis not present

## 2019-08-09 DIAGNOSIS — E222 Syndrome of inappropriate secretion of antidiuretic hormone: Secondary | ICD-10-CM | POA: Diagnosis not present

## 2019-08-09 DIAGNOSIS — G4730 Sleep apnea, unspecified: Secondary | ICD-10-CM | POA: Diagnosis not present

## 2019-08-09 DIAGNOSIS — F418 Other specified anxiety disorders: Secondary | ICD-10-CM | POA: Diagnosis not present

## 2019-08-09 DIAGNOSIS — E782 Mixed hyperlipidemia: Secondary | ICD-10-CM | POA: Diagnosis not present

## 2019-08-09 DIAGNOSIS — E871 Hypo-osmolality and hyponatremia: Secondary | ICD-10-CM | POA: Diagnosis not present

## 2019-08-09 DIAGNOSIS — D649 Anemia, unspecified: Secondary | ICD-10-CM | POA: Diagnosis not present

## 2019-08-11 DIAGNOSIS — I10 Essential (primary) hypertension: Secondary | ICD-10-CM | POA: Diagnosis not present

## 2019-08-11 DIAGNOSIS — E871 Hypo-osmolality and hyponatremia: Secondary | ICD-10-CM | POA: Diagnosis not present

## 2019-08-11 DIAGNOSIS — G2581 Restless legs syndrome: Secondary | ICD-10-CM | POA: Diagnosis not present

## 2019-08-11 DIAGNOSIS — F0390 Unspecified dementia without behavioral disturbance: Secondary | ICD-10-CM | POA: Diagnosis not present

## 2019-08-12 DIAGNOSIS — I2510 Atherosclerotic heart disease of native coronary artery without angina pectoris: Secondary | ICD-10-CM | POA: Diagnosis not present

## 2019-08-12 DIAGNOSIS — I10 Essential (primary) hypertension: Secondary | ICD-10-CM | POA: Diagnosis not present

## 2019-08-12 DIAGNOSIS — G4730 Sleep apnea, unspecified: Secondary | ICD-10-CM | POA: Diagnosis not present

## 2019-08-12 DIAGNOSIS — E871 Hypo-osmolality and hyponatremia: Secondary | ICD-10-CM | POA: Diagnosis not present

## 2019-08-12 DIAGNOSIS — F0390 Unspecified dementia without behavioral disturbance: Secondary | ICD-10-CM | POA: Diagnosis not present

## 2019-08-12 DIAGNOSIS — D649 Anemia, unspecified: Secondary | ICD-10-CM | POA: Diagnosis not present

## 2019-08-12 DIAGNOSIS — F418 Other specified anxiety disorders: Secondary | ICD-10-CM | POA: Diagnosis not present

## 2019-08-12 DIAGNOSIS — E222 Syndrome of inappropriate secretion of antidiuretic hormone: Secondary | ICD-10-CM | POA: Diagnosis not present

## 2019-08-12 DIAGNOSIS — I4891 Unspecified atrial fibrillation: Secondary | ICD-10-CM | POA: Diagnosis not present

## 2019-08-12 DIAGNOSIS — K219 Gastro-esophageal reflux disease without esophagitis: Secondary | ICD-10-CM | POA: Diagnosis not present

## 2019-08-12 DIAGNOSIS — E782 Mixed hyperlipidemia: Secondary | ICD-10-CM | POA: Diagnosis not present

## 2019-08-12 DIAGNOSIS — J449 Chronic obstructive pulmonary disease, unspecified: Secondary | ICD-10-CM | POA: Diagnosis not present

## 2019-08-21 DIAGNOSIS — E871 Hypo-osmolality and hyponatremia: Secondary | ICD-10-CM | POA: Diagnosis not present

## 2019-08-21 DIAGNOSIS — J449 Chronic obstructive pulmonary disease, unspecified: Secondary | ICD-10-CM | POA: Diagnosis not present

## 2019-08-21 DIAGNOSIS — F418 Other specified anxiety disorders: Secondary | ICD-10-CM | POA: Diagnosis not present

## 2019-08-21 DIAGNOSIS — F0390 Unspecified dementia without behavioral disturbance: Secondary | ICD-10-CM | POA: Diagnosis not present

## 2019-08-21 DIAGNOSIS — E782 Mixed hyperlipidemia: Secondary | ICD-10-CM | POA: Diagnosis not present

## 2019-08-21 DIAGNOSIS — G4730 Sleep apnea, unspecified: Secondary | ICD-10-CM | POA: Diagnosis not present

## 2019-08-21 DIAGNOSIS — I4891 Unspecified atrial fibrillation: Secondary | ICD-10-CM | POA: Diagnosis not present

## 2019-08-21 DIAGNOSIS — I2510 Atherosclerotic heart disease of native coronary artery without angina pectoris: Secondary | ICD-10-CM | POA: Diagnosis not present

## 2019-08-21 DIAGNOSIS — E222 Syndrome of inappropriate secretion of antidiuretic hormone: Secondary | ICD-10-CM | POA: Diagnosis not present

## 2019-08-21 DIAGNOSIS — I10 Essential (primary) hypertension: Secondary | ICD-10-CM | POA: Diagnosis not present

## 2019-08-21 DIAGNOSIS — D649 Anemia, unspecified: Secondary | ICD-10-CM | POA: Diagnosis not present

## 2019-08-21 DIAGNOSIS — K219 Gastro-esophageal reflux disease without esophagitis: Secondary | ICD-10-CM | POA: Diagnosis not present

## 2019-08-28 DIAGNOSIS — I10 Essential (primary) hypertension: Secondary | ICD-10-CM | POA: Diagnosis not present

## 2019-08-28 DIAGNOSIS — G2581 Restless legs syndrome: Secondary | ICD-10-CM | POA: Diagnosis not present

## 2019-08-28 DIAGNOSIS — G4730 Sleep apnea, unspecified: Secondary | ICD-10-CM | POA: Diagnosis not present

## 2019-08-28 DIAGNOSIS — F418 Other specified anxiety disorders: Secondary | ICD-10-CM | POA: Diagnosis not present

## 2019-08-28 DIAGNOSIS — E871 Hypo-osmolality and hyponatremia: Secondary | ICD-10-CM | POA: Diagnosis not present

## 2019-08-28 DIAGNOSIS — F0390 Unspecified dementia without behavioral disturbance: Secondary | ICD-10-CM | POA: Diagnosis not present

## 2019-08-28 DIAGNOSIS — E782 Mixed hyperlipidemia: Secondary | ICD-10-CM | POA: Diagnosis not present

## 2019-08-28 DIAGNOSIS — I4891 Unspecified atrial fibrillation: Secondary | ICD-10-CM | POA: Diagnosis not present

## 2019-08-28 DIAGNOSIS — I2510 Atherosclerotic heart disease of native coronary artery without angina pectoris: Secondary | ICD-10-CM | POA: Diagnosis not present

## 2019-08-28 DIAGNOSIS — E222 Syndrome of inappropriate secretion of antidiuretic hormone: Secondary | ICD-10-CM | POA: Diagnosis not present

## 2019-08-28 DIAGNOSIS — D649 Anemia, unspecified: Secondary | ICD-10-CM | POA: Diagnosis not present

## 2019-08-28 DIAGNOSIS — K219 Gastro-esophageal reflux disease without esophagitis: Secondary | ICD-10-CM | POA: Diagnosis not present

## 2019-08-31 DIAGNOSIS — D631 Anemia in chronic kidney disease: Secondary | ICD-10-CM | POA: Diagnosis not present

## 2019-08-31 DIAGNOSIS — E1122 Type 2 diabetes mellitus with diabetic chronic kidney disease: Secondary | ICD-10-CM | POA: Diagnosis not present

## 2019-08-31 DIAGNOSIS — F411 Generalized anxiety disorder: Secondary | ICD-10-CM | POA: Diagnosis not present

## 2019-08-31 DIAGNOSIS — G9340 Encephalopathy, unspecified: Secondary | ICD-10-CM | POA: Diagnosis not present

## 2019-08-31 DIAGNOSIS — F418 Other specified anxiety disorders: Secondary | ICD-10-CM | POA: Diagnosis not present

## 2019-08-31 DIAGNOSIS — I509 Heart failure, unspecified: Secondary | ICD-10-CM | POA: Diagnosis not present

## 2019-08-31 DIAGNOSIS — N183 Chronic kidney disease, stage 3 unspecified: Secondary | ICD-10-CM | POA: Diagnosis not present

## 2019-08-31 DIAGNOSIS — G2581 Restless legs syndrome: Secondary | ICD-10-CM | POA: Diagnosis not present

## 2019-08-31 DIAGNOSIS — F0281 Dementia in other diseases classified elsewhere with behavioral disturbance: Secondary | ICD-10-CM | POA: Diagnosis not present

## 2019-08-31 DIAGNOSIS — I350 Nonrheumatic aortic (valve) stenosis: Secondary | ICD-10-CM | POA: Diagnosis not present

## 2019-08-31 DIAGNOSIS — E222 Syndrome of inappropriate secretion of antidiuretic hormone: Secondary | ICD-10-CM | POA: Diagnosis not present

## 2019-08-31 DIAGNOSIS — K635 Polyp of colon: Secondary | ICD-10-CM | POA: Diagnosis not present

## 2019-08-31 DIAGNOSIS — G600 Hereditary motor and sensory neuropathy: Secondary | ICD-10-CM | POA: Diagnosis not present

## 2019-08-31 DIAGNOSIS — H3530 Unspecified macular degeneration: Secondary | ICD-10-CM | POA: Diagnosis not present

## 2019-08-31 DIAGNOSIS — F05 Delirium due to known physiological condition: Secondary | ICD-10-CM | POA: Diagnosis not present

## 2019-08-31 DIAGNOSIS — I2510 Atherosclerotic heart disease of native coronary artery without angina pectoris: Secondary | ICD-10-CM | POA: Diagnosis not present

## 2019-08-31 DIAGNOSIS — J449 Chronic obstructive pulmonary disease, unspecified: Secondary | ICD-10-CM | POA: Diagnosis not present

## 2019-08-31 DIAGNOSIS — I130 Hypertensive heart and chronic kidney disease with heart failure and stage 1 through stage 4 chronic kidney disease, or unspecified chronic kidney disease: Secondary | ICD-10-CM | POA: Diagnosis not present

## 2019-08-31 DIAGNOSIS — K5090 Crohn's disease, unspecified, without complications: Secondary | ICD-10-CM | POA: Diagnosis not present

## 2019-08-31 DIAGNOSIS — K219 Gastro-esophageal reflux disease without esophagitis: Secondary | ICD-10-CM | POA: Diagnosis not present

## 2019-08-31 DIAGNOSIS — E538 Deficiency of other specified B group vitamins: Secondary | ICD-10-CM | POA: Diagnosis not present

## 2019-08-31 DIAGNOSIS — N3281 Overactive bladder: Secondary | ICD-10-CM | POA: Diagnosis not present

## 2019-08-31 DIAGNOSIS — M545 Low back pain: Secondary | ICD-10-CM | POA: Diagnosis not present

## 2019-08-31 DIAGNOSIS — I480 Paroxysmal atrial fibrillation: Secondary | ICD-10-CM | POA: Diagnosis not present

## 2019-08-31 DIAGNOSIS — E113219 Type 2 diabetes mellitus with mild nonproliferative diabetic retinopathy with macular edema, unspecified eye: Secondary | ICD-10-CM | POA: Diagnosis not present

## 2019-09-02 ENCOUNTER — Telehealth: Payer: Self-pay

## 2019-09-02 NOTE — Telephone Encounter (Signed)
Noted, thanks!

## 2019-09-02 NOTE — Telephone Encounter (Signed)
RN Laurence Compton from [facility] called stating that pt is no longer living at assistance living facility. As per RN Thayer Headings, pt was discharged from rehab center. Pt had a home evaluation completed over the weekend and RN is recommending home nurse visits with the nurse for pt. Requesting verbal order. RN informed to proceed with home visits. RN wanted provider to also be aware that during home evaluation pt and daughter wereconsistently arguing over details of care. RN Thayer Headings will keep provider updated with pt's home visits prn.

## 2019-09-06 ENCOUNTER — Telehealth: Payer: Self-pay

## 2019-09-06 DIAGNOSIS — F05 Delirium due to known physiological condition: Secondary | ICD-10-CM | POA: Diagnosis not present

## 2019-09-06 DIAGNOSIS — F0281 Dementia in other diseases classified elsewhere with behavioral disturbance: Secondary | ICD-10-CM | POA: Diagnosis not present

## 2019-09-06 DIAGNOSIS — I2510 Atherosclerotic heart disease of native coronary artery without angina pectoris: Secondary | ICD-10-CM | POA: Diagnosis not present

## 2019-09-06 DIAGNOSIS — G9340 Encephalopathy, unspecified: Secondary | ICD-10-CM | POA: Diagnosis not present

## 2019-09-06 DIAGNOSIS — E222 Syndrome of inappropriate secretion of antidiuretic hormone: Secondary | ICD-10-CM | POA: Diagnosis not present

## 2019-09-06 DIAGNOSIS — E538 Deficiency of other specified B group vitamins: Secondary | ICD-10-CM | POA: Diagnosis not present

## 2019-09-06 NOTE — Telephone Encounter (Signed)
Wylie Hail from [facility] called requesting verbal order for at home physical therapy for pt. As per Lea, physical therapy will be 1 week/1x, 2 weeks/4x and 1 week/4x. Verbal order given to proceed with home physical therapy for pt. Direct call back info and fax number provided to Physical therapist for future orders. No other inquiries during call.

## 2019-09-09 ENCOUNTER — Ambulatory Visit (INDEPENDENT_AMBULATORY_CARE_PROVIDER_SITE_OTHER): Payer: Medicare Other | Admitting: Osteopathic Medicine

## 2019-09-09 ENCOUNTER — Encounter: Payer: Self-pay | Admitting: Osteopathic Medicine

## 2019-09-09 ENCOUNTER — Other Ambulatory Visit: Payer: Self-pay

## 2019-09-09 VITALS — BP 172/94 | HR 68 | Temp 98.6°F | Wt 132.1 lb

## 2019-09-09 DIAGNOSIS — R262 Difficulty in walking, not elsewhere classified: Secondary | ICD-10-CM

## 2019-09-09 DIAGNOSIS — F0390 Unspecified dementia without behavioral disturbance: Secondary | ICD-10-CM | POA: Diagnosis not present

## 2019-09-09 DIAGNOSIS — E871 Hypo-osmolality and hyponatremia: Secondary | ICD-10-CM | POA: Diagnosis not present

## 2019-09-09 NOTE — Progress Notes (Signed)
HPI: Bethany Park is a 85 y.o. female who  has a past medical history of Anemia, Aortic stenosis, Atrial fibrillation ([place]), B12 deficiency (03/13/2018), Colon polyp (09/2013), Dementia ([place]), Depression with anxiety (03/13/2018), Essential hypertension (03/13/2018), Glucose intolerance (03/13/2018), Heart attack ([place]), Heart failure ([place]), History of COPD, History of coronary artery stent placement (09/2017), Moderate aortic stenosis (03/13/2018), Osteoporosis (03/13/2018), Ovarian cancer ([place]), Restless leg syndrome (03/13/2018), and Stroke ([place]).  she presents to [facility] today, 09/09/19,  for chief complaint of: follow up from rehab  Patient is being seen today for follow up from rehab for hyponatremia and a fall.  States that she is feeling well today and hasn't had any recent falls. Since discharge from rehab, patient is currently under the care of her daughter, Bethany Park, who is Power of Forensic psychologist. Patient denies that Bethany Park has any authority over her legally. She asserts she is able to take care of herself and do all of her daily activities and will be getting her own apartment. Reports that daughter is very controlling and doesn't let her do what she wants but cannot give specific examples. Feels safe at home but is argumentative with daughter and insistent that she will not be staying with her.   Spoke with daughter, she states that [name] is wanting to move out into her own apartment.  Options have been exhausted with regard to assisted living/nursing home, financially they cannot afford this and patient's insurance does not cover it.  Currently, Ms. Bethany Park and her husband are expecting some foster children in the next few months, so Yilia's old room is not available and she has been staying in [place] room.  Cecille Rubin has been giving her mom a bit more independence with regard to managing her medications.  Ideally, she would like her mom to be living somewhere else but  this just does not seem like it is an option.  Patient is accompanied by daughter, Bethany Park who assists with history-taking.    Past medical, surgical, social and family history reviewed and updated as necessary.   Current medication list and allergy/intolerance information reviewed:    Current Outpatient Medications  Medication Sig Dispense Refill  . albuterol (PROVENTIL HFA;VENTOLIN HFA) 108 (90 Base) MCG/ACT inhaler Inhale 2 puffs into the lungs every 6 (six) hours as needed for wheezing. 2 Inhaler 11  . [facility] bed with rails 1 Units prn  . AMBULATORY NON FORMULARY MEDICATION Wheelchair 1 Units prn  . AMBULATORY NON FORMULARY MEDICATION Physical therapy eval and treat for dx: ambulatory dysfunction, charcot marie tooth syndrome, frequent falls. Fax attnManson Passey [phone] 1 Units 99  . amitriptyline (ELAVIL) 25 MG tablet Take 1 tablet (25 mg total) by mouth at bedtime. 90 tablet 0  . atorvastatin (LIPITOR) 40 MG tablet Take 1 tablet (40 mg total) by mouth daily. 90 tablet 3  . carvedilol (COREG) 3.125 MG tablet Take 1 tablet (3.125 mg total) by mouth 2 (two) times daily with a meal. 180 tablet 3  . clopidogrel (PLAVIX) 75 MG tablet Take 1 tablet by mouth once daily 90 tablet 0  . famotidine (PEPCID) 40 MG tablet Take 1 tablet (40 mg total) by mouth daily. 30 tablet 0  . gabapentin (NEURONTIN) 300 MG capsule Take 3 capsules (900 mg total) by mouth at bedtime. 270 capsule 1  . HYDROcodone-acetaminophen (NORCO) 5-325 MG tablet Take 1 tablet by mouth every 6 (six) hours as needed for severe pain. #45 for 30 days  45 tablet 0  . lisinopril (ZESTRIL) 2.5 MG tablet Take 1 tablet (2.5 mg total) by mouth daily. 90 tablet 3  . LORazepam (ATIVAN) 0.5 MG tablet Take 0.5-1 tablets (0.25-0.5 mg total) by mouth at bedtime as needed for anxiety or sleep. 30 tablet 0  . lubiprostone (AMITIZA) 8 MCG capsule Take 1 capsule (8 mcg total) by mouth 2 (two) times daily  with a meal. Start day after next bowel movement 60 capsule 1  . Melatonin 10 MG SUBL Place 10 mg under the tongue at bedtime. 90 tablet 3  . nitroGLYCERIN (NITROSTAT) 0.4 MG SL tablet Place 1 tablet (0.4 mg total) under the tongue every 5 (five) minutes as needed for chest pain. If no better 10-15 mins, go to hospital 20 tablet 1  . nystatin (MYCOSTATIN/NYSTOP) powder Apply topically 4 (four) times daily. 60 g 1  . rOPINIRole (REQUIP) 3 MG tablet Take 1 tablet (3 mg total) by mouth 3 (three) times daily. 270 tablet 3  . umeclidinium-vilanterol (ANORO ELLIPTA) 62.5-25 MCG/INH AEPB Inhale 1 puff into the lungs daily. 60 each 5   No current facility-administered medications for this visit.     Allergies  Allergen Reactions  . Alendronate Sodium Other (See Comments)  . Benztropine Other (See Comments)  . Citalopram Other (See Comments)  . Duloxetine Hcl Other (See Comments)  . Erythromycin Other (See Comments)  . Fluoxetine Hcl Other (See Comments)  . Glimepiride Other (See Comments)  . Nortriptyline Hcl Other (See Comments)  . Latex Other (See Comments)    Per daughter  . Penicillins Other (See Comments)  . Seroquel [Quetiapine Fumarate] Other (See Comments)    As per daughter, hallucinations.      Review of Systems:  Constitutional:  No  fever, no chills, No recent illness   Cardiac: No  chest pain  Respiratory:  No  shortness of breath. No  Cough  Musculoskeletal: No new myalgia/arthralgia  Neurologic: No  weakness, No  dizziness  Exam:  BP (!) 172/94 (BP Location: Left Arm, Patient Position: Sitting, Cuff Size: Normal)   Pulse 68   Temp 98.6 F (37 C) (Oral)   Wt 132 lb 1.3 oz (59.9 kg)   BMI 20.08 kg/m   Constitutional: VS see above. General Appearance: alert, well-developed, well-nourished, NAD  Eyes: Normal lids and conjunctive, non-icteric sclera  Neck: No masses, trachea midline. No tenderness/mass appreciated. No lymphadenopathy  Respiratory: Normal  respiratory effort. no wheeze, no rhonchi, no rales  Cardiovascular: V5/I4 normal, diastolic murmur, no rub/gallop auscultated. RRR. No lower extremity edema.   Gastrointestinal: Nontender, no masses. Bowel sounds normal.  Musculoskeletal: Gait normal.   Neurological: Normal balance/coordination using walker. No tremor.   Skin: warm, dry, intact. Erythematous rash on left side of face.   Psychiatric: Poor judgment/insight. Sad, irritable mood and affect. A&O x 3.    No results found for this or any previous visit (from the past 72 hour(s)).  No results found.   ASSESSMENT/PLAN: The primary encounter diagnosis was Dementia without behavioral disturbance, unspecified dementia type ([place]). Diagnoses of Hyponatremia and Ambulatory dysfunction were also pertinent to this visit.   Advised patient that she will not be able to live alone due to comorbidities and frequent falls. Patient was adamant and tearful, stating that she would not live with daughter despite risks. Patient has poor insight into her medical state. Advised patient that this was the only option and offered support to help patient find things she would like to do.  Cecille Rubin asked [place] to provide a letter for the patient to reference, she is forgetful and will probably try to argue her case for living independently again.  Ray thinks that having a letter stating that this is not possible will be helpful.  See letters.  Will obtain labs to follow up on sodium.   No orders of the defined types were placed in this encounter.   Orders Placed This Encounter  Procedures  . COMPLETE METABOLIC PANEL WITH GFR  . Osmolality, serum  . Osmolality, urine  . Sodium, urine, random         Visit summary with medication list and pertinent instructions was printed for patient to review. All questions at time of visit were answered - patient instructed to contact office with any additional concerns or updates. ER/RTC precautions were  reviewed with the patient.   Follow-up plan: No follow-ups on file.  Note: Total time spent 25 minutes, greater than 50% of the visit was spent face-to-face counseling and coordinating care for the following: The primary encounter diagnosis was Dementia without behavioral disturbance, unspecified dementia type ([place]). Diagnoses of Hyponatremia and Ambulatory dysfunction were also pertinent to this visit.Marland Kitchen  Please note: voice recognition software was used to produce this document, and typos may escape review. Please contact Dr. Sheppard Coil for any needed clarifications.

## 2019-09-11 DIAGNOSIS — F0281 Dementia in other diseases classified elsewhere with behavioral disturbance: Secondary | ICD-10-CM | POA: Diagnosis not present

## 2019-09-11 DIAGNOSIS — F05 Delirium due to known physiological condition: Secondary | ICD-10-CM | POA: Diagnosis not present

## 2019-09-11 DIAGNOSIS — E222 Syndrome of inappropriate secretion of antidiuretic hormone: Secondary | ICD-10-CM | POA: Diagnosis not present

## 2019-09-11 DIAGNOSIS — I2510 Atherosclerotic heart disease of native coronary artery without angina pectoris: Secondary | ICD-10-CM | POA: Diagnosis not present

## 2019-09-11 DIAGNOSIS — E538 Deficiency of other specified B group vitamins: Secondary | ICD-10-CM | POA: Diagnosis not present

## 2019-09-11 DIAGNOSIS — G9340 Encephalopathy, unspecified: Secondary | ICD-10-CM | POA: Diagnosis not present

## 2019-09-11 LAB — COMPLETE METABOLIC PANEL WITH GFR
AG Ratio: 1.6 (calc) (ref 1.0–2.5)
ALT: 18 U/L (ref 6–29)
AST: 23 U/L (ref 10–35)
Albumin: 3.9 g/dL (ref 3.6–5.1)
Alkaline phosphatase (APISO): 64 U/L (ref 37–153)
BUN/Creatinine Ratio: 31 (calc) — ABNORMAL HIGH (ref 6–22)
BUN: 32 mg/dL — ABNORMAL HIGH (ref 7–25)
CO2: 27 mmol/L (ref 20–32)
Calcium: 9.3 mg/dL (ref 8.6–10.4)
Chloride: 104 mmol/L (ref 98–110)
Creat: 1.03 mg/dL — ABNORMAL HIGH (ref 0.60–0.88)
GFR, Est African American: 55 mL/min/{1.73_m2} — ABNORMAL LOW (ref >=60)
GFR, Est Non African American: 48 mL/min/{1.73_m2} — ABNORMAL LOW (ref >=60)
Globulin: 2.5 g/dL (calc) (ref 1.9–3.7)
Glucose, Bld: 110 mg/dL — ABNORMAL HIGH (ref 65–99)
Potassium: 4.4 mmol/L (ref 3.5–5.3)
Sodium: 137 mmol/L (ref 135–146)
Total Bilirubin: 0.4 mg/dL (ref 0.2–1.2)
Total Protein: 6.4 g/dL (ref 6.1–8.1)

## 2019-09-11 LAB — SODIUM, URINE, RANDOM: Sodium, Ur: 123 mmol/L (ref 28–272)

## 2019-09-11 LAB — OSMOLALITY: Osmolality: 294 mOsm/kg (ref 278–305)

## 2019-09-11 LAB — OSMOLALITY, URINE: Osmolality, Ur: 687 mOsm/kg (ref 50–1200)

## 2019-09-12 DIAGNOSIS — E538 Deficiency of other specified B group vitamins: Secondary | ICD-10-CM | POA: Diagnosis not present

## 2019-09-12 DIAGNOSIS — F0281 Dementia in other diseases classified elsewhere with behavioral disturbance: Secondary | ICD-10-CM | POA: Diagnosis not present

## 2019-09-12 DIAGNOSIS — F05 Delirium due to known physiological condition: Secondary | ICD-10-CM | POA: Diagnosis not present

## 2019-09-12 DIAGNOSIS — E222 Syndrome of inappropriate secretion of antidiuretic hormone: Secondary | ICD-10-CM | POA: Diagnosis not present

## 2019-09-12 DIAGNOSIS — G9340 Encephalopathy, unspecified: Secondary | ICD-10-CM | POA: Diagnosis not present

## 2019-09-12 DIAGNOSIS — I2510 Atherosclerotic heart disease of native coronary artery without angina pectoris: Secondary | ICD-10-CM | POA: Diagnosis not present

## 2019-09-13 DIAGNOSIS — G9340 Encephalopathy, unspecified: Secondary | ICD-10-CM | POA: Diagnosis not present

## 2019-09-13 DIAGNOSIS — F05 Delirium due to known physiological condition: Secondary | ICD-10-CM | POA: Diagnosis not present

## 2019-09-13 DIAGNOSIS — I2510 Atherosclerotic heart disease of native coronary artery without angina pectoris: Secondary | ICD-10-CM | POA: Diagnosis not present

## 2019-09-13 DIAGNOSIS — E538 Deficiency of other specified B group vitamins: Secondary | ICD-10-CM | POA: Diagnosis not present

## 2019-09-13 DIAGNOSIS — F0281 Dementia in other diseases classified elsewhere with behavioral disturbance: Secondary | ICD-10-CM | POA: Diagnosis not present

## 2019-09-13 DIAGNOSIS — E222 Syndrome of inappropriate secretion of antidiuretic hormone: Secondary | ICD-10-CM | POA: Diagnosis not present

## 2019-09-16 DIAGNOSIS — I2510 Atherosclerotic heart disease of native coronary artery without angina pectoris: Secondary | ICD-10-CM | POA: Diagnosis not present

## 2019-09-16 DIAGNOSIS — E222 Syndrome of inappropriate secretion of antidiuretic hormone: Secondary | ICD-10-CM | POA: Diagnosis not present

## 2019-09-16 DIAGNOSIS — G9340 Encephalopathy, unspecified: Secondary | ICD-10-CM | POA: Diagnosis not present

## 2019-09-16 DIAGNOSIS — E538 Deficiency of other specified B group vitamins: Secondary | ICD-10-CM | POA: Diagnosis not present

## 2019-09-16 DIAGNOSIS — F0281 Dementia in other diseases classified elsewhere with behavioral disturbance: Secondary | ICD-10-CM | POA: Diagnosis not present

## 2019-09-16 DIAGNOSIS — F05 Delirium due to known physiological condition: Secondary | ICD-10-CM | POA: Diagnosis not present

## 2019-09-17 ENCOUNTER — Telehealth: Payer: Self-pay

## 2019-09-17 NOTE — Telephone Encounter (Signed)
Physical Therapist Becky left a vm msg stating that pt had her blood pressure elevated during home visit. She stated that bp reading was 183/70. After pt's physical therapy session, bp was at 202/80. Therapist mentioned that pt was complaining of headaches, but had informed therapist that this is a chronic issue. Therapist is requesting recommendation from provider. Pls advise, thanks.

## 2019-09-18 NOTE — Telephone Encounter (Signed)
Left a detailed vm msg for Physical Therapist Becky regarding provider's note. Direct call back info provided.

## 2019-09-18 NOTE — Telephone Encounter (Signed)
Patient has consistently declined medications, insists on managing meds herself. Unless she's ok with daughter managing medications to ensure she takes them, there's not much I can do. Patient has been made aware multiple times of risk of complications of high BP including death.

## 2019-09-19 DIAGNOSIS — E222 Syndrome of inappropriate secretion of antidiuretic hormone: Secondary | ICD-10-CM | POA: Diagnosis not present

## 2019-09-19 DIAGNOSIS — E538 Deficiency of other specified B group vitamins: Secondary | ICD-10-CM | POA: Diagnosis not present

## 2019-09-19 DIAGNOSIS — F05 Delirium due to known physiological condition: Secondary | ICD-10-CM | POA: Diagnosis not present

## 2019-09-19 DIAGNOSIS — G9340 Encephalopathy, unspecified: Secondary | ICD-10-CM | POA: Diagnosis not present

## 2019-09-19 DIAGNOSIS — F0281 Dementia in other diseases classified elsewhere with behavioral disturbance: Secondary | ICD-10-CM | POA: Diagnosis not present

## 2019-09-19 DIAGNOSIS — I2510 Atherosclerotic heart disease of native coronary artery without angina pectoris: Secondary | ICD-10-CM | POA: Diagnosis not present

## 2019-09-30 DIAGNOSIS — D631 Anemia in chronic kidney disease: Secondary | ICD-10-CM | POA: Diagnosis not present

## 2019-09-30 DIAGNOSIS — G9340 Encephalopathy, unspecified: Secondary | ICD-10-CM | POA: Diagnosis not present

## 2019-09-30 DIAGNOSIS — N3281 Overactive bladder: Secondary | ICD-10-CM | POA: Diagnosis not present

## 2019-09-30 DIAGNOSIS — K635 Polyp of colon: Secondary | ICD-10-CM | POA: Diagnosis not present

## 2019-09-30 DIAGNOSIS — E113219 Type 2 diabetes mellitus with mild nonproliferative diabetic retinopathy with macular edema, unspecified eye: Secondary | ICD-10-CM | POA: Diagnosis not present

## 2019-09-30 DIAGNOSIS — G2581 Restless legs syndrome: Secondary | ICD-10-CM | POA: Diagnosis not present

## 2019-09-30 DIAGNOSIS — E538 Deficiency of other specified B group vitamins: Secondary | ICD-10-CM | POA: Diagnosis not present

## 2019-09-30 DIAGNOSIS — K5090 Crohn's disease, unspecified, without complications: Secondary | ICD-10-CM | POA: Diagnosis not present

## 2019-09-30 DIAGNOSIS — F411 Generalized anxiety disorder: Secondary | ICD-10-CM | POA: Diagnosis not present

## 2019-09-30 DIAGNOSIS — K219 Gastro-esophageal reflux disease without esophagitis: Secondary | ICD-10-CM | POA: Diagnosis not present

## 2019-09-30 DIAGNOSIS — I509 Heart failure, unspecified: Secondary | ICD-10-CM | POA: Diagnosis not present

## 2019-09-30 DIAGNOSIS — F418 Other specified anxiety disorders: Secondary | ICD-10-CM | POA: Diagnosis not present

## 2019-09-30 DIAGNOSIS — E1122 Type 2 diabetes mellitus with diabetic chronic kidney disease: Secondary | ICD-10-CM | POA: Diagnosis not present

## 2019-09-30 DIAGNOSIS — I350 Nonrheumatic aortic (valve) stenosis: Secondary | ICD-10-CM | POA: Diagnosis not present

## 2019-09-30 DIAGNOSIS — F0281 Dementia in other diseases classified elsewhere with behavioral disturbance: Secondary | ICD-10-CM | POA: Diagnosis not present

## 2019-09-30 DIAGNOSIS — I2510 Atherosclerotic heart disease of native coronary artery without angina pectoris: Secondary | ICD-10-CM | POA: Diagnosis not present

## 2019-09-30 DIAGNOSIS — N183 Chronic kidney disease, stage 3 unspecified: Secondary | ICD-10-CM | POA: Diagnosis not present

## 2019-09-30 DIAGNOSIS — M545 Low back pain: Secondary | ICD-10-CM | POA: Diagnosis not present

## 2019-09-30 DIAGNOSIS — F05 Delirium due to known physiological condition: Secondary | ICD-10-CM | POA: Diagnosis not present

## 2019-09-30 DIAGNOSIS — H3530 Unspecified macular degeneration: Secondary | ICD-10-CM | POA: Diagnosis not present

## 2019-09-30 DIAGNOSIS — G600 Hereditary motor and sensory neuropathy: Secondary | ICD-10-CM | POA: Diagnosis not present

## 2019-09-30 DIAGNOSIS — J449 Chronic obstructive pulmonary disease, unspecified: Secondary | ICD-10-CM | POA: Diagnosis not present

## 2019-09-30 DIAGNOSIS — I130 Hypertensive heart and chronic kidney disease with heart failure and stage 1 through stage 4 chronic kidney disease, or unspecified chronic kidney disease: Secondary | ICD-10-CM | POA: Diagnosis not present

## 2019-09-30 DIAGNOSIS — E222 Syndrome of inappropriate secretion of antidiuretic hormone: Secondary | ICD-10-CM | POA: Diagnosis not present

## 2019-09-30 DIAGNOSIS — I480 Paroxysmal atrial fibrillation: Secondary | ICD-10-CM | POA: Diagnosis not present

## 2019-10-11 ENCOUNTER — Telehealth: Payer: Self-pay

## 2019-10-11 DIAGNOSIS — F0281 Dementia in other diseases classified elsewhere with behavioral disturbance: Secondary | ICD-10-CM | POA: Diagnosis not present

## 2019-10-11 DIAGNOSIS — E538 Deficiency of other specified B group vitamins: Secondary | ICD-10-CM | POA: Diagnosis not present

## 2019-10-11 DIAGNOSIS — I2510 Atherosclerotic heart disease of native coronary artery without angina pectoris: Secondary | ICD-10-CM | POA: Diagnosis not present

## 2019-10-11 DIAGNOSIS — E222 Syndrome of inappropriate secretion of antidiuretic hormone: Secondary | ICD-10-CM | POA: Diagnosis not present

## 2019-10-11 DIAGNOSIS — G9340 Encephalopathy, unspecified: Secondary | ICD-10-CM | POA: Diagnosis not present

## 2019-10-11 DIAGNOSIS — F05 Delirium due to known physiological condition: Secondary | ICD-10-CM | POA: Diagnosis not present

## 2019-10-11 NOTE — Telephone Encounter (Signed)
Physical Therapist Jacqlyn Larsen from [facility] called regarding a fall that pt had last week. Due to facility protocol, she must inform provider of incident. She stated that pt has a significant, noticeable bruise on the left side of her forehead and left hand. The bruises on her hand is looking better and healing. Pt was complaining of pain in her tailbone area. As per Jacqlyn Larsen, she stated that pt's daughter already called provider notifying her of pt's fall. Jacqlyn Larsen can be reached at [phone] for any inquiries.

## 2019-10-11 NOTE — Telephone Encounter (Signed)
Left a detailed vm msg for Physical Therapist Becky regarding provider's recommendation. Direct call back info provided.

## 2019-10-11 NOTE — Telephone Encounter (Signed)
Thanks.  If concern for significant injury, patient would need evaluation in office or urgent care.

## 2019-10-16 DIAGNOSIS — I2510 Atherosclerotic heart disease of native coronary artery without angina pectoris: Secondary | ICD-10-CM | POA: Diagnosis not present

## 2019-10-16 DIAGNOSIS — E222 Syndrome of inappropriate secretion of antidiuretic hormone: Secondary | ICD-10-CM | POA: Diagnosis not present

## 2019-10-16 DIAGNOSIS — F05 Delirium due to known physiological condition: Secondary | ICD-10-CM | POA: Diagnosis not present

## 2019-10-16 DIAGNOSIS — F0281 Dementia in other diseases classified elsewhere with behavioral disturbance: Secondary | ICD-10-CM | POA: Diagnosis not present

## 2019-10-16 DIAGNOSIS — E538 Deficiency of other specified B group vitamins: Secondary | ICD-10-CM | POA: Diagnosis not present

## 2019-10-16 DIAGNOSIS — G9340 Encephalopathy, unspecified: Secondary | ICD-10-CM | POA: Diagnosis not present

## 2019-10-19 DIAGNOSIS — F05 Delirium due to known physiological condition: Secondary | ICD-10-CM | POA: Diagnosis not present

## 2019-10-19 DIAGNOSIS — G9340 Encephalopathy, unspecified: Secondary | ICD-10-CM | POA: Diagnosis not present

## 2019-10-19 DIAGNOSIS — E222 Syndrome of inappropriate secretion of antidiuretic hormone: Secondary | ICD-10-CM | POA: Diagnosis not present

## 2019-10-19 DIAGNOSIS — F0281 Dementia in other diseases classified elsewhere with behavioral disturbance: Secondary | ICD-10-CM | POA: Diagnosis not present

## 2019-10-19 DIAGNOSIS — E538 Deficiency of other specified B group vitamins: Secondary | ICD-10-CM | POA: Diagnosis not present

## 2019-10-19 DIAGNOSIS — I2510 Atherosclerotic heart disease of native coronary artery without angina pectoris: Secondary | ICD-10-CM | POA: Diagnosis not present

## 2019-10-24 DIAGNOSIS — E538 Deficiency of other specified B group vitamins: Secondary | ICD-10-CM | POA: Diagnosis not present

## 2019-10-24 DIAGNOSIS — F0281 Dementia in other diseases classified elsewhere with behavioral disturbance: Secondary | ICD-10-CM | POA: Diagnosis not present

## 2019-10-24 DIAGNOSIS — E222 Syndrome of inappropriate secretion of antidiuretic hormone: Secondary | ICD-10-CM | POA: Diagnosis not present

## 2019-10-24 DIAGNOSIS — F05 Delirium due to known physiological condition: Secondary | ICD-10-CM | POA: Diagnosis not present

## 2019-10-24 DIAGNOSIS — I2510 Atherosclerotic heart disease of native coronary artery without angina pectoris: Secondary | ICD-10-CM | POA: Diagnosis not present

## 2019-10-24 DIAGNOSIS — G9340 Encephalopathy, unspecified: Secondary | ICD-10-CM | POA: Diagnosis not present

## 2019-11-10 IMAGING — DX DG NASAL BONES 3+V
3 series · 3 of 3 positions shown · non-contrast
Comparison: None.

CLINICAL DATA: Status post fall.  Nasal pain.

EXAM:
NASAL BONES - 3+ VIEW

[nasal waters]
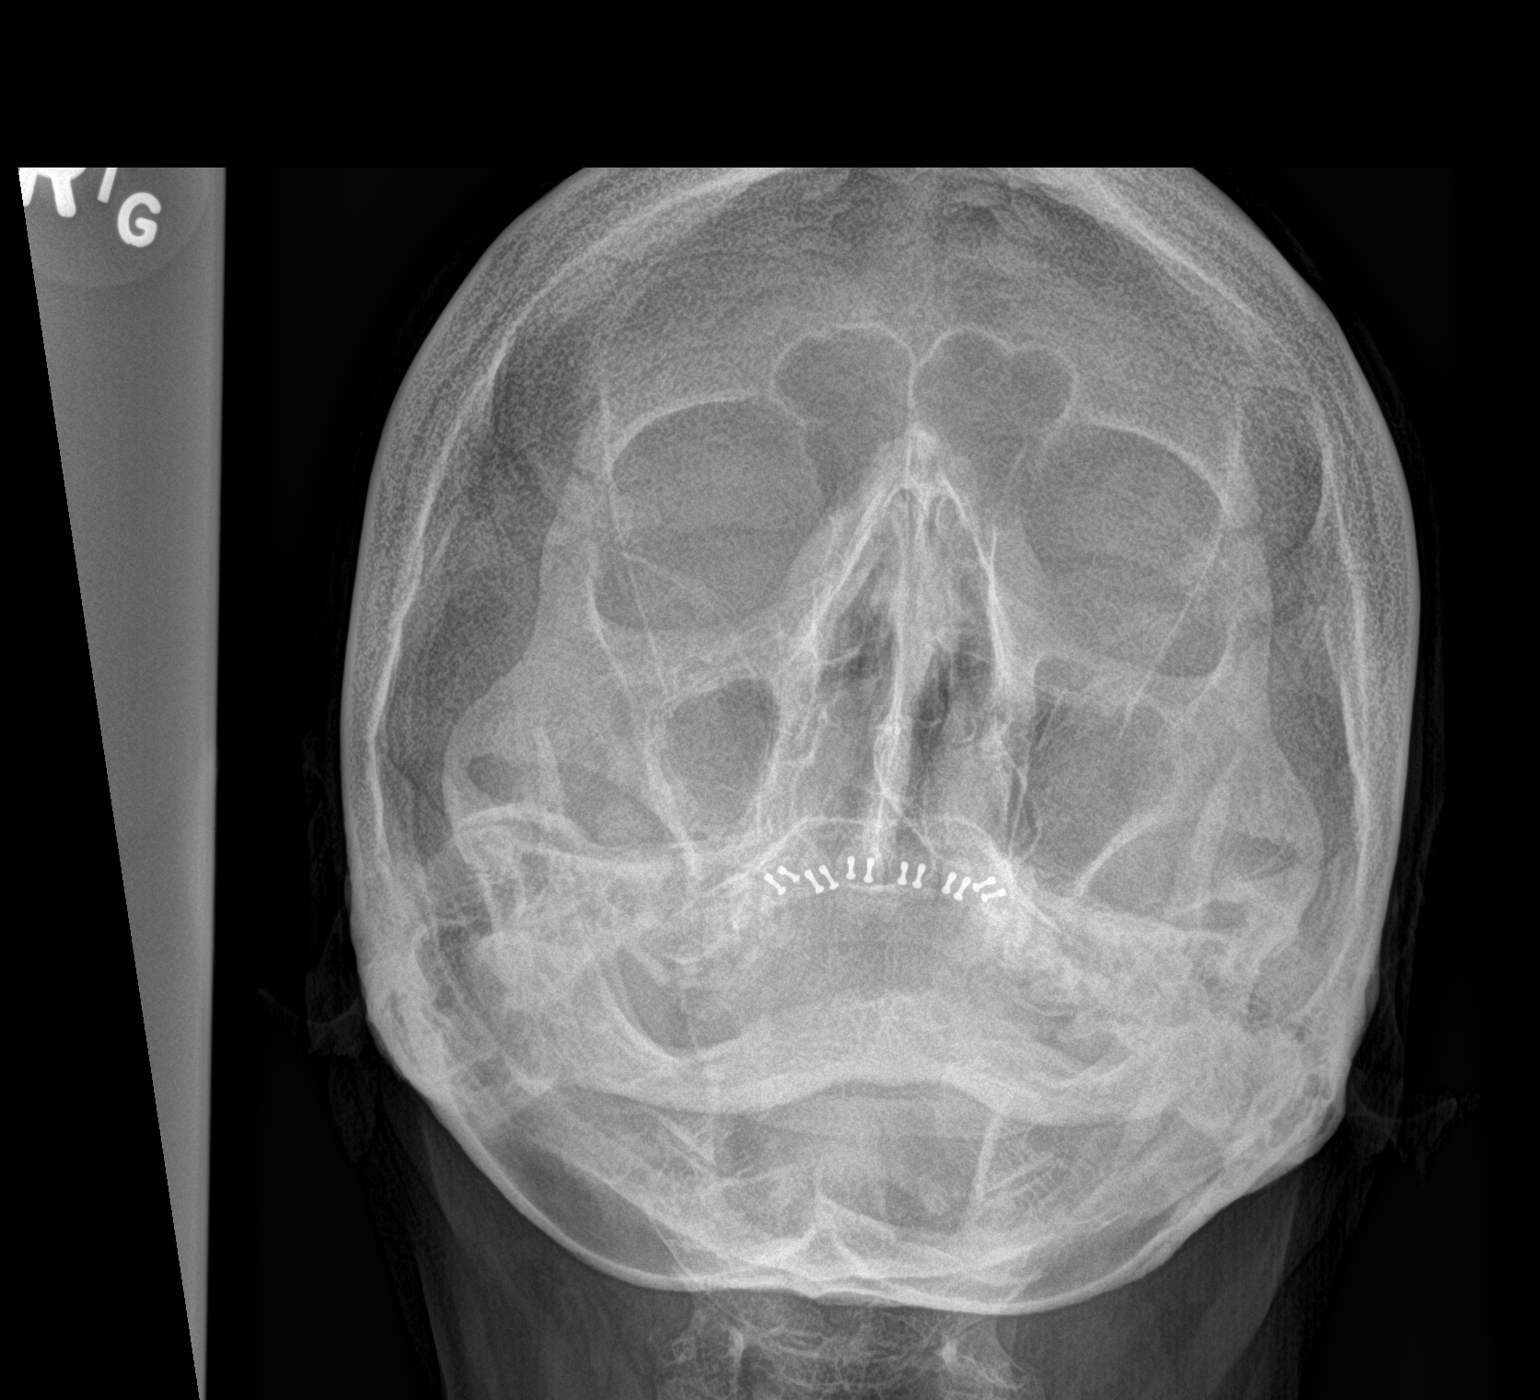

[nasal lat (1 of 2)]
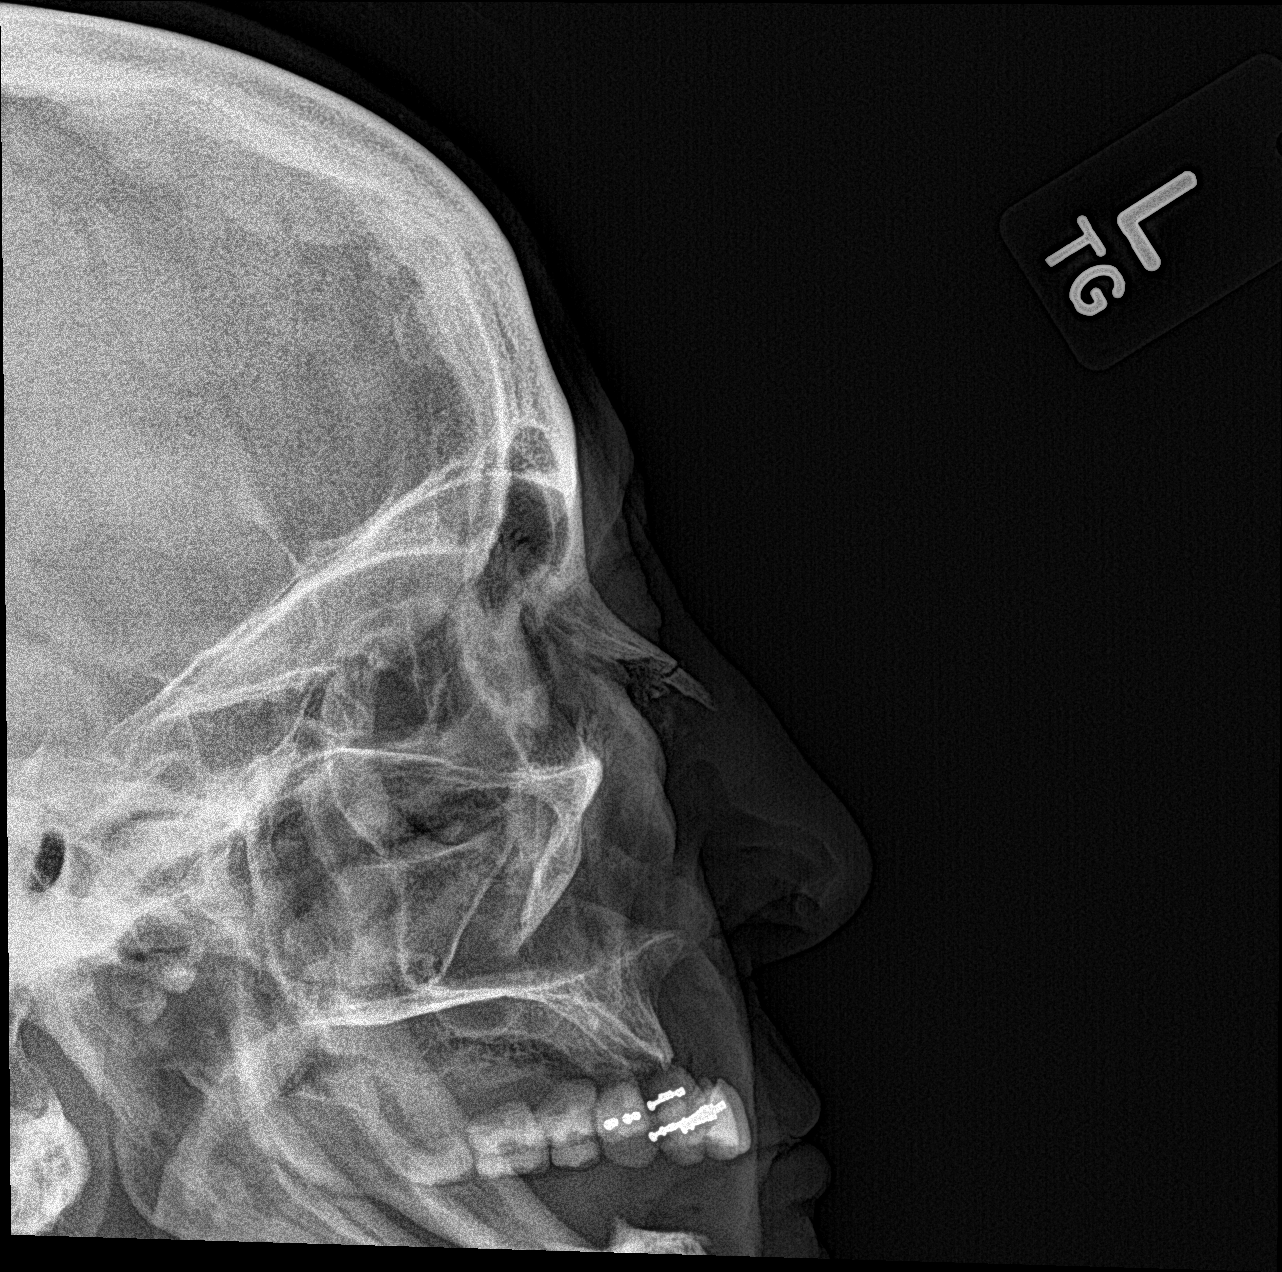

[nasal lat (2 of 2)]
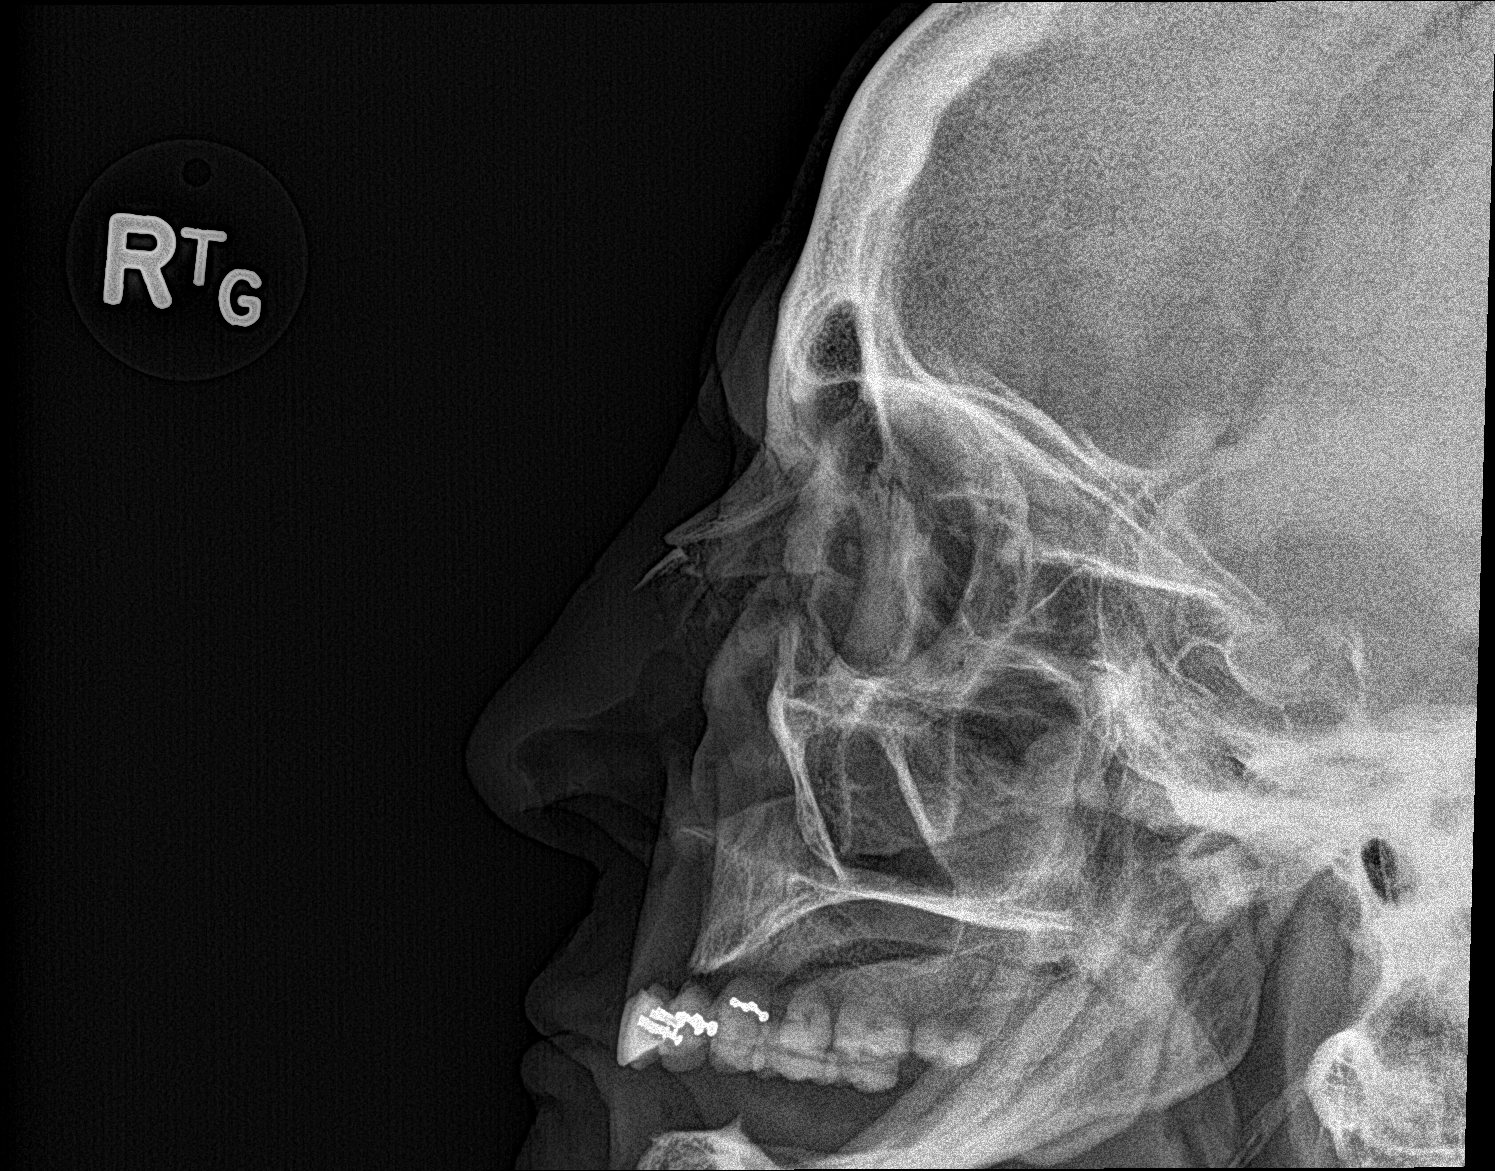

[3 of 3 positions shown; findings below may reference images not displayed]

FINDINGS: Mildly depressed fracture of the nasal bone with 2 mm of diffuse
depression. Paranasal sinuses are clear.
IMPRESSION: Mild depressed fracture of the nasal bone.

## 2019-11-11 ENCOUNTER — Other Ambulatory Visit: Payer: Self-pay

## 2019-11-11 ENCOUNTER — Telehealth: Payer: Self-pay

## 2019-11-11 MED ORDER — ROPINIROLE HCL 3 MG PO TABS: 3.0000 mg | ORAL_TABLET | Freq: Three times a day (TID) | ORAL | 3 refills | Status: DC

## 2019-11-11 MED ORDER — FAMOTIDINE 40 MG PO TABS: 40.0000 mg | ORAL_TABLET | Freq: Every day | ORAL | 3 refills | Status: AC

## 2019-11-11 NOTE — Telephone Encounter (Signed)
[place] requesting med refill for famotidine. Unable to send, rx written by external provider.

## 2019-11-12 ENCOUNTER — Other Ambulatory Visit: Payer: Self-pay

## 2019-11-12 ENCOUNTER — Telehealth: Payer: Self-pay

## 2019-11-12 DIAGNOSIS — M25551 Pain in right hip: Secondary | ICD-10-CM

## 2019-11-12 DIAGNOSIS — G8929 Other chronic pain: Secondary | ICD-10-CM

## 2019-11-12 DIAGNOSIS — M545 Low back pain, unspecified: Secondary | ICD-10-CM

## 2019-11-12 MED ORDER — GABAPENTIN 300 MG PO CAPS: 900.0000 mg | ORAL_CAPSULE | Freq: Every day | ORAL | 1 refills | Status: DC

## 2019-11-12 NOTE — Telephone Encounter (Signed)
Pt's daughter stopped by the office earlier today with a letter requesting med refills for pt. At her request, daily maintenance med refills sent to the pharmacy. She is also requesting med refills for HYDROCODONE-ACE and LORAZEPAM. Pls send refills to [place] [facility]. Thanks.

## 2019-11-13 ENCOUNTER — Other Ambulatory Visit: Payer: Self-pay

## 2019-11-13 MED ORDER — LISINOPRIL 2.5 MG PO TABS: 2.5000 mg | ORAL_TABLET | Freq: Every day | ORAL | 3 refills | Status: DC

## 2019-11-14 MED ORDER — LORAZEPAM 0.5 MG PO TABS: 0.2500 mg | ORAL_TABLET | Freq: Every evening | ORAL | 0 refills | Status: DC | PRN

## 2019-11-14 MED ORDER — HYDROCODONE-ACETAMINOPHEN 5-325 MG PO TABS: 1.0000 | ORAL_TABLET | Freq: Four times a day (QID) | ORAL | 0 refills | Status: DC | PRN

## 2019-12-17 IMAGING — CR DG CHEST 2V
2 series · 2 of 2 positions shown · non-contrast
Comparison: None.

CLINICAL DATA: Chest pain

EXAM:
CHEST - 2 VIEW

[chest lat]
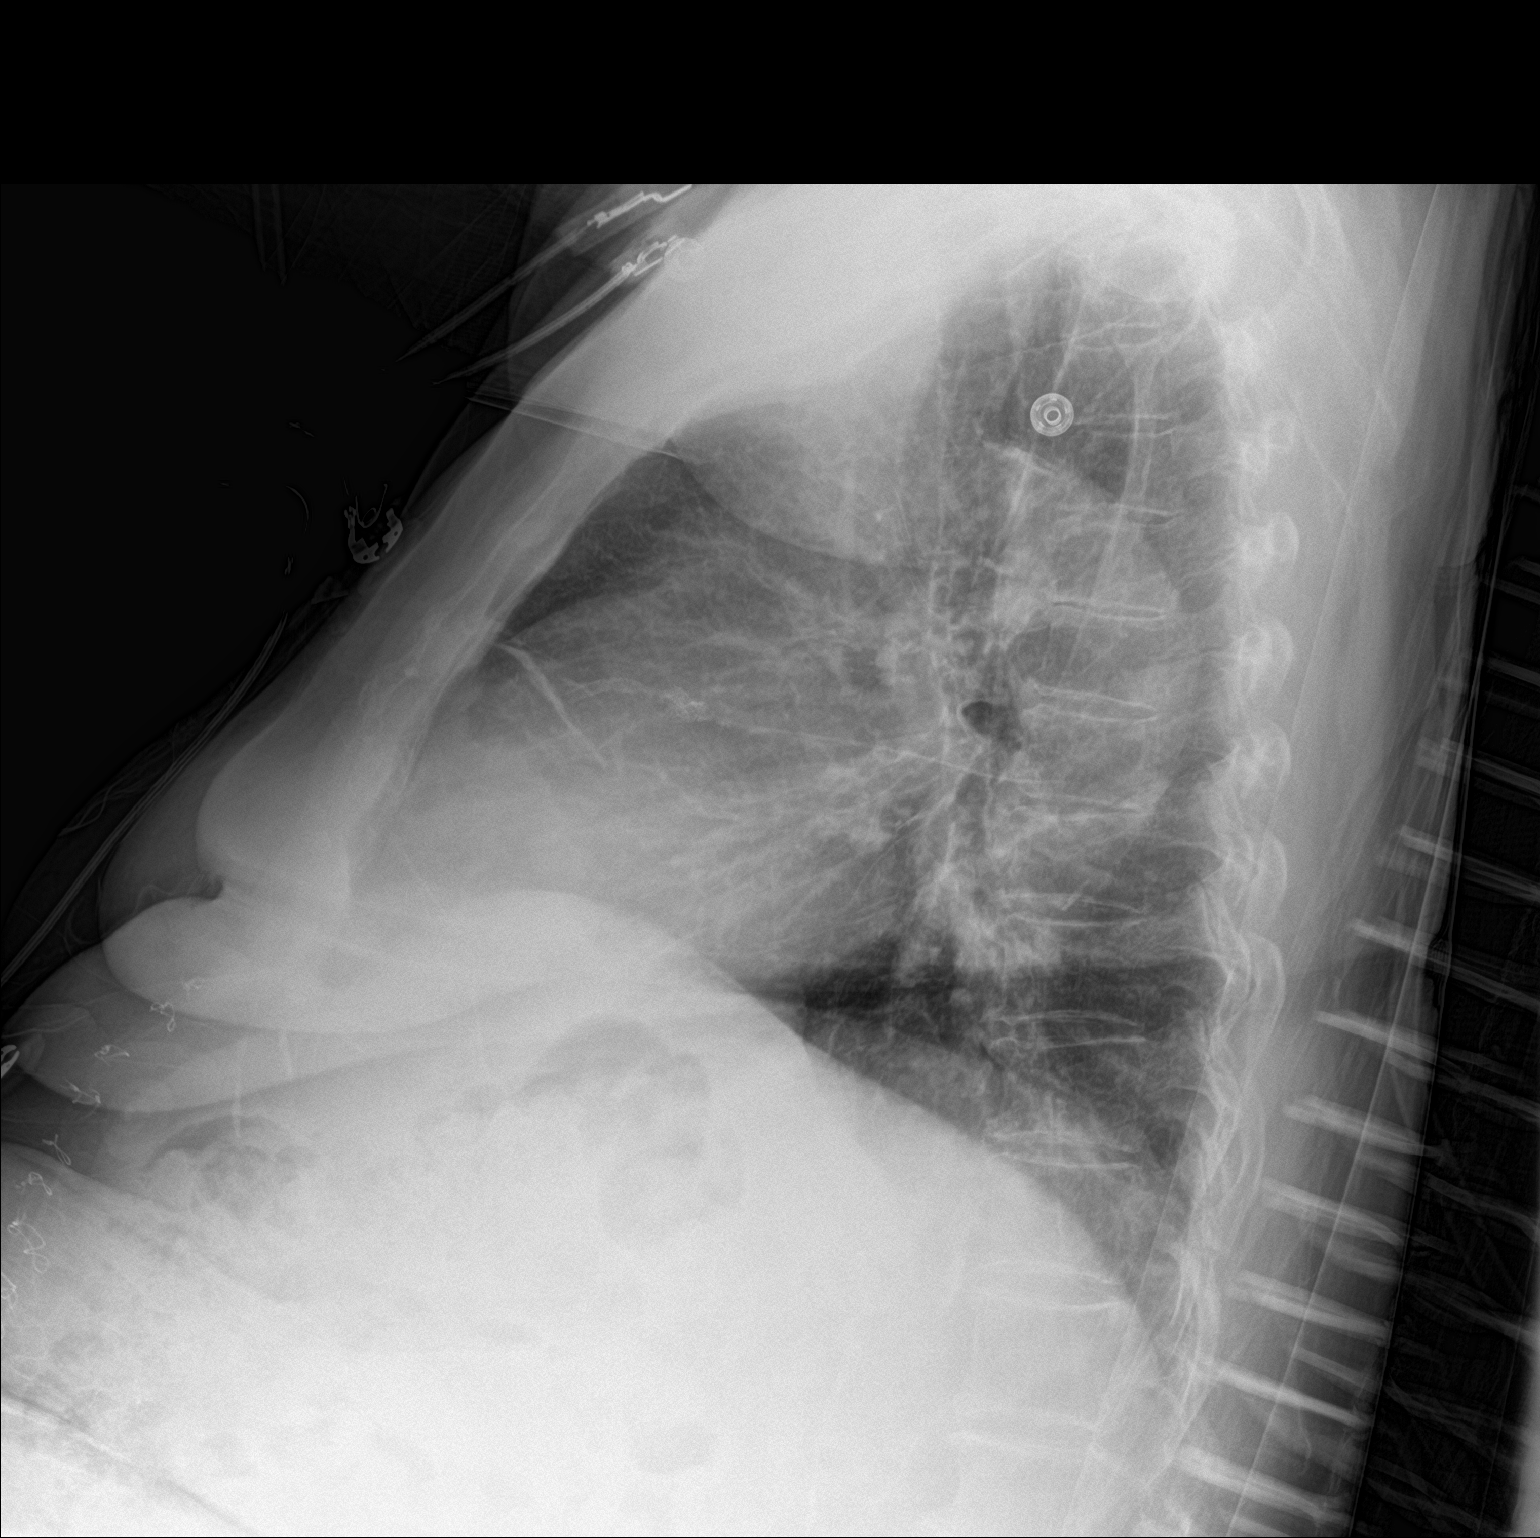

[chest ap]
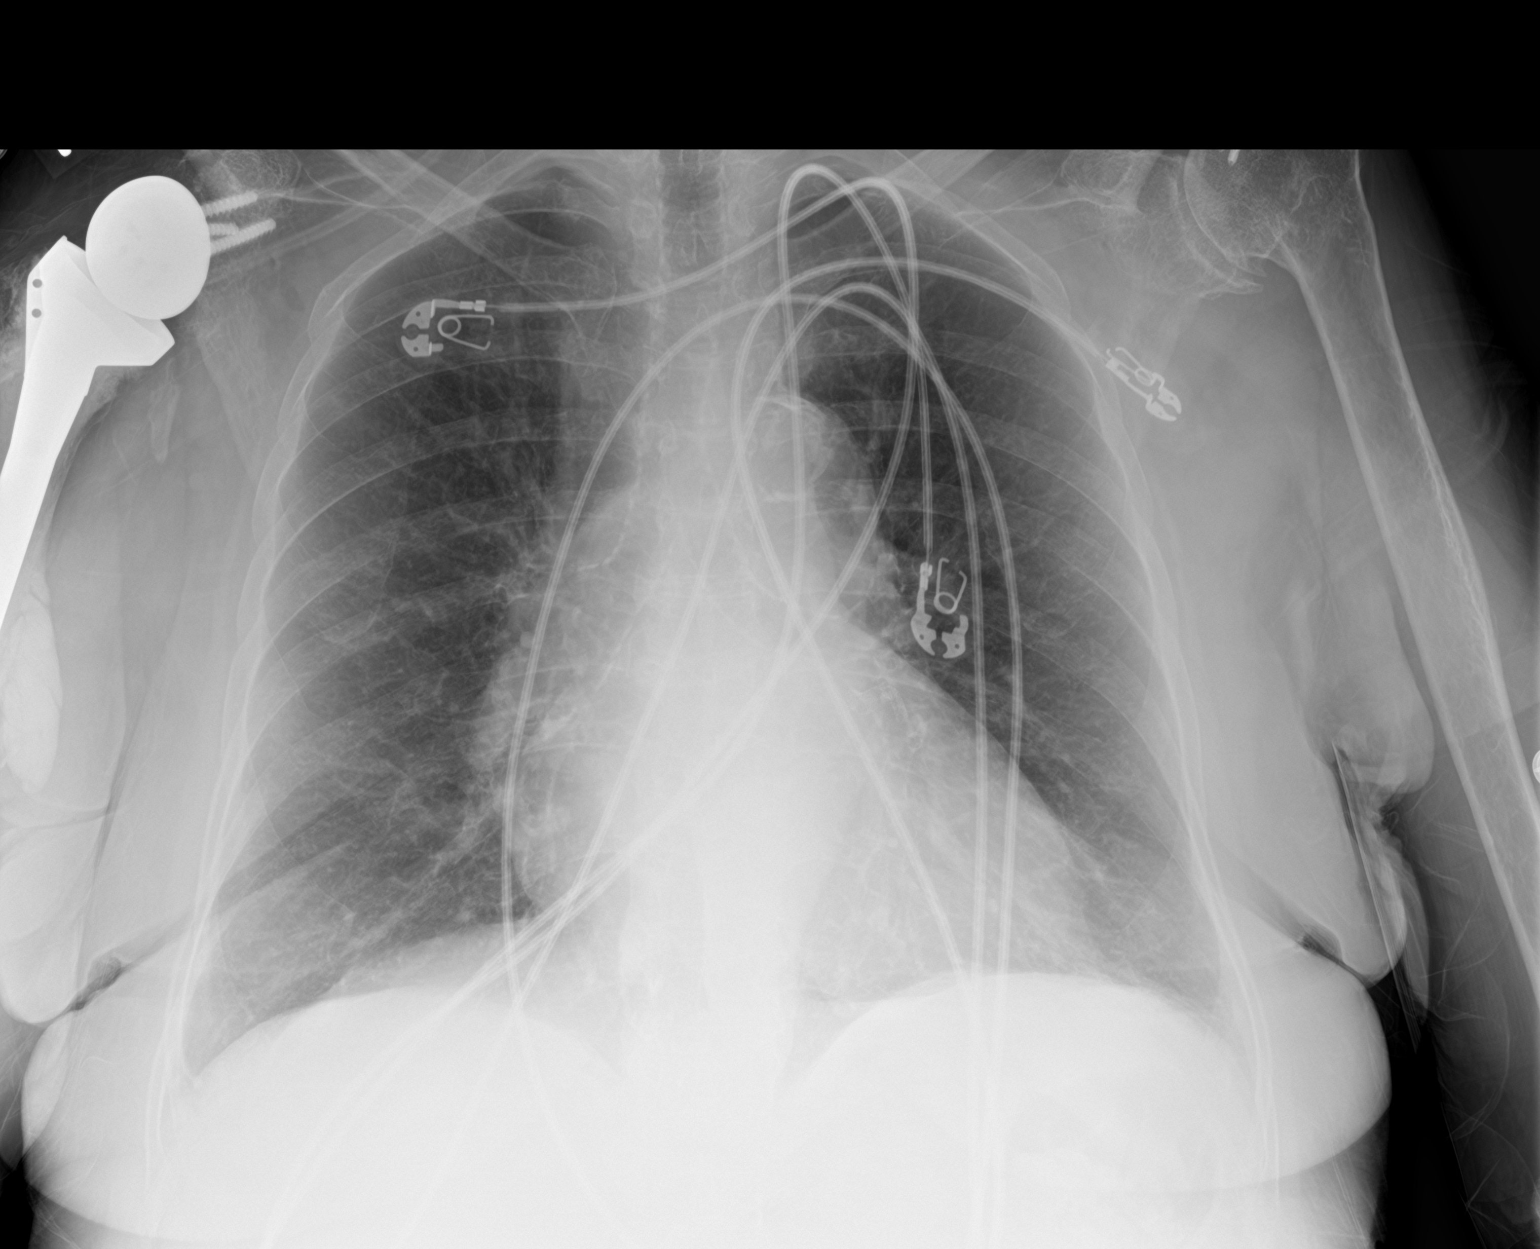

[2 of 2 positions shown; findings below may reference images not displayed]

FINDINGS: No focal opacity or pleural effusion. Mild cardiomegaly. Tortuous
aorta with atherosclerosis. No pneumothorax. Right shoulder
replacement.
IMPRESSION: No active cardiopulmonary disease.  Mild cardiomegaly.

## 2019-12-19 DIAGNOSIS — K219 Gastro-esophageal reflux disease without esophagitis: Secondary | ICD-10-CM | POA: Diagnosis not present

## 2019-12-19 DIAGNOSIS — Z880 Allergy status to penicillin: Secondary | ICD-10-CM | POA: Diagnosis not present

## 2019-12-19 DIAGNOSIS — G8911 Acute pain due to trauma: Secondary | ICD-10-CM | POA: Diagnosis not present

## 2019-12-19 DIAGNOSIS — S0990XA Unspecified injury of head, initial encounter: Secondary | ICD-10-CM | POA: Diagnosis not present

## 2019-12-19 DIAGNOSIS — I10 Essential (primary) hypertension: Secondary | ICD-10-CM | POA: Diagnosis not present

## 2019-12-19 DIAGNOSIS — Z23 Encounter for immunization: Secondary | ICD-10-CM | POA: Diagnosis not present

## 2019-12-19 DIAGNOSIS — S161XXA Strain of muscle, fascia and tendon at neck level, initial encounter: Secondary | ICD-10-CM | POA: Diagnosis not present

## 2019-12-19 DIAGNOSIS — Z91040 Latex allergy status: Secondary | ICD-10-CM | POA: Diagnosis not present

## 2019-12-19 DIAGNOSIS — S29012A Strain of muscle and tendon of back wall of thorax, initial encounter: Secondary | ICD-10-CM | POA: Diagnosis not present

## 2019-12-19 DIAGNOSIS — Z79899 Other long term (current) drug therapy: Secondary | ICD-10-CM | POA: Diagnosis not present

## 2019-12-19 DIAGNOSIS — S0181XA Laceration without foreign body of other part of head, initial encounter: Secondary | ICD-10-CM | POA: Diagnosis not present

## 2019-12-19 DIAGNOSIS — S0083XA Contusion of other part of head, initial encounter: Secondary | ICD-10-CM | POA: Diagnosis not present

## 2019-12-24 ENCOUNTER — Other Ambulatory Visit: Payer: Self-pay

## 2019-12-24 ENCOUNTER — Ambulatory Visit (INDEPENDENT_AMBULATORY_CARE_PROVIDER_SITE_OTHER): Payer: Medicare Other | Admitting: Sports Medicine

## 2019-12-24 ENCOUNTER — Telehealth: Payer: Self-pay | Admitting: Sports Medicine

## 2019-12-24 DIAGNOSIS — S0101XD Laceration without foreign body of scalp, subsequent encounter: Secondary | ICD-10-CM

## 2019-12-24 DIAGNOSIS — M7741 Metatarsalgia, right foot: Secondary | ICD-10-CM | POA: Insufficient documentation

## 2019-12-24 DIAGNOSIS — S0101XA Laceration without foreign body of scalp, initial encounter: Secondary | ICD-10-CM | POA: Insufficient documentation

## 2019-12-24 DIAGNOSIS — M7742 Metatarsalgia, left foot: Secondary | ICD-10-CM

## 2019-12-24 MED ORDER — NYSTATIN 100000 UNIT/GM EX POWD: Freq: Four times a day (QID) | CUTANEOUS | 1 refills | Status: AC

## 2019-12-24 MED ORDER — ATORVASTATIN CALCIUM 40 MG PO TABS: 40.0000 mg | ORAL_TABLET | Freq: Every day | ORAL | 3 refills | Status: DC

## 2019-12-24 MED ORDER — LORAZEPAM 0.5 MG PO TABS: 0.2500 mg | ORAL_TABLET | Freq: Every evening | ORAL | 0 refills | Status: DC | PRN

## 2019-12-24 NOTE — Telephone Encounter (Signed)
Sent please call daughter and let her knwo!  Thanks!

## 2019-12-24 NOTE — Telephone Encounter (Signed)
This patient is in the office for foot pain and in a scalp laceration, she is requesting refills for lorazepam, atorvastatin, and Nystop, informed her I would forward this message to her primary care provider.  I informed her this was outside of my specialty.

## 2019-12-24 NOTE — Addendum Note (Signed)
Addended by: Maryla Morrow on: 12/24/2019 03:43 PM   Modules accepted: Orders

## 2019-12-24 NOTE — Progress Notes (Signed)
    Procedures performed today:    None.  Independent interpretation of tests performed by another provider:   I personally reviewed the outside ED report, I also reviewed her CT of the cervical spine and CT of the head, I also completed the history through her daughter.  Impression and Recommendations:    Metatarsalgia of both feet This pleasant 86 year old female has pain in the bottom of both of her feet localized at the metatarsal heads, she does have significant pes cavus with drop of the transverse arch and abnormal callus. I think she needs some custom orthotics with metatarsal pads, referral to Dr. Raeford Razor.  Laceration of scalp 5 days ago this pleasant 86 year old female also had an accidental trip and fall, she was seen in the ED where lacerations were repaired, 1 with simple interrupted 5-0 Prolene sutures and the other with skin adhesive. CT of the head and cervical spine did not show any acute changes. I removed her sutures today, incision is clean, dry, intact, Band-Aid applied. Return as needed for this.    ___________________________________________ Gwen Her. Dianah Field, M.D., ABFM., CAQSM. Primary Care and [place] Instructor of [place] of [facility] of Medicine

## 2019-12-24 NOTE — Assessment & Plan Note (Signed)
This pleasant 86 year old female has pain in the bottom of both of her feet localized at the metatarsal heads, she does have significant pes cavus with drop of the transverse arch and abnormal callus. I think she needs some custom orthotics with metatarsal pads, referral to Dr. Raeford Razor.

## 2019-12-24 NOTE — Assessment & Plan Note (Addendum)
5 days ago this pleasant 86 year old female also had an accidental trip and fall, she was seen in the ED where lacerations were repaired, 1 with simple interrupted 5-0 Prolene sutures and the other with skin adhesive. CT of the head and cervical spine did not show any acute changes. I removed her sutures today, incision is clean, dry, intact, Band-Aid applied. Return as needed for this.

## 2019-12-24 NOTE — Telephone Encounter (Signed)
Pt's daughter informed of refills.  Charyl Bigger, CMA

## 2020-01-02 ENCOUNTER — Other Ambulatory Visit: Payer: Self-pay

## 2020-01-02 ENCOUNTER — Encounter: Payer: Self-pay | Admitting: Family Medicine

## 2020-01-02 ENCOUNTER — Ambulatory Visit (INDEPENDENT_AMBULATORY_CARE_PROVIDER_SITE_OTHER): Payer: Medicare Other | Admitting: Family Medicine

## 2020-01-02 DIAGNOSIS — M7741 Metatarsalgia, right foot: Secondary | ICD-10-CM | POA: Diagnosis not present

## 2020-01-02 DIAGNOSIS — M7742 Metatarsalgia, left foot: Secondary | ICD-10-CM

## 2020-01-02 NOTE — Assessment & Plan Note (Signed)
Pes cavus and hammer toeing.  - orthotics  - counseled on supportive care - could consider adding scaphoid pads and MT pads.

## 2020-01-02 NOTE — Progress Notes (Signed)
Bethany Park - 86 y.o. female MRN [id number]  Date of birth: November 08, 1926  SUBJECTIVE:  Including CC & ROS.  Chief Complaint  Patient presents with  . Foot Orthotics    Bethany Park is a 86 y.o. female that is presenting with metatarsalgia.  She has pes cavus as well as hallux valgus.  Pain seems to be worse when she walks for extended period of time.   Review of Systems See HPI   HISTORY: Past Medical, Surgical, Social, and Family History Reviewed & Updated per EMR.   Pertinent Historical Findings include:  Past Medical History:  Diagnosis Date  . Anemia   . Aortic stenosis   . Atrial fibrillation ([place])   . B12 deficiency 03/13/2018  . Colon polyp 09/2013   in [place]  for personal hx polyps and fm hx colon ca.  found small transverse polyp, diverticulosis, int hemorrhoids.    . Dementia ([place])   . Depression with anxiety 03/13/2018  . Essential hypertension 03/13/2018  . Glucose intolerance 03/13/2018  . Heart attack ([place])   . Heart failure ([place])   . History of COPD   . History of coronary artery stent placement 09/2017  . Moderate aortic stenosis 03/13/2018  . Osteoporosis 03/13/2018  . Ovarian cancer ([place])   . Restless leg syndrome 03/13/2018  . Stroke [facility])     Past Surgical History:  Procedure Laterality Date  . ABDOMINAL HYSTERECTOMY    . BACK SURGERY    . BREAST SURGERY    . CHOLECYSTECTOMY    . CORONARY STENT PLACEMENT    . ESOPHAGOGASTRODUODENOSCOPY N/A 04/16/2018   Procedure: ESOPHAGOGASTRODUODENOSCOPY (EGD);  Surgeon: Ladene Artist, MD;  Location[facility] ENDOSCOPY;  Service: Endoscopy;  Laterality: N/A;  . EYE SURGERY    . HEMORROIDECTOMY    . HOT HEMOSTASIS N/A 04/16/2018   Procedure: HOT HEMOSTASIS (ARGON PLASMA COAGULATION/BICAP);  Surgeon: Ladene Artist, MD;  Location: [facility] ENDOSCOPY;  Service: Endoscopy;  Laterality: N/A;  . JOINT REPLACEMENT      Family History  Problem Relation Age of Onset  . Heart disease Mother   . Diabetes Mellitus II  Mother   . Stroke Mother   . Colon cancer Father     Social History   Socioeconomic History  . Marital status: Widowed    Spouse name: Not on file  . Number of children: 8  . Years of education: Not on file  . Highest education level: Not on file  Occupational History  . Not on file  Tobacco Use  . Smoking status: Never Smoker  . Smokeless tobacco: Never Used  Substance and Sexual Activity  . Alcohol use: Not Currently  . Drug use: Not Currently  . Sexual activity: Not Currently  Other Topics Concern  . Not on file  Social History Narrative  . Not on file   Social Determinants of Health   Financial Resource Strain:   . Difficulty of Paying Living Expenses: Not on file  Food Insecurity:   . Worried About Charity fundraiser in the Last Year: Not on file  . Ran Out of Food in the Last Year: Not on file  Transportation Needs:   . Lack of Transportation (Medical): Not on file  . Lack of Transportation (Non-Medical): Not on file  Physical Activity:   . Days of Exercise per Week: Not on file  . Minutes of Exercise per Session: Not on file  Stress:   . Feeling of Stress : Not on file  Social Connections:   . Frequency of Communication with Friends and Family: Not on file  . Frequency of Social Gatherings with Friends and Family: Not on file  . Attends Religious Services: Not on file  . Active Member of Clubs or Organizations: Not on file  . Attends Archivist Meetings: Not on file  . Marital Status: Not on file  Intimate Partner Violence:   . Fear of Current or Ex-Partner: Not on file  . Emotionally Abused: Not on file  . Physically Abused: Not on file  . Sexually Abused: Not on file     PHYSICAL EXAM:  VS: BP (!) 172/80   Pulse 76   Ht 5' 5"  (1.651 m)   Wt 135 lb (61.2 kg)   BMI 22.47 kg/m  Physical Exam Gen: NAD, alert, cooperative with exam, well-appearing MSK:  Right and left foot: Pes cavus. Degenerative changes of the first MTP  joint. Hammertoes apparent. Neurovascularly intact  Patient was fitted for a standard, cushioned, semi-rigid orthotic. The orthotic was heated and afterward the patient stood on the orthotic blank positioned on the orthotic stand. The patient was positioned in subtalar neutral position and 10 degrees of ankle dorsiflexion in a weight bearing stance. After completion of molding, a stable base was applied to the orthotic blank. The blank was ground to a stable position for weight bearing. Size: 81mBase: BEaton Corporationand Padding: None The patient ambulated these, and they were very comfortable.   ASSESSMENT & PLAN:   Metatarsalgia of both feet Pes cavus and hammer toeing.  - orthotics  - counseled on supportive care - could consider adding scaphoid pads and MT pads.

## 2020-01-21 ENCOUNTER — Other Ambulatory Visit: Payer: Self-pay | Admitting: Osteopathic Medicine

## 2020-01-21 NOTE — Telephone Encounter (Signed)
Patient's guardian stopped by and said that patient needed a refill on a few medications. Meds and pharmacy listed below. [phone]) Please Advise. AM   carvedilol (COREG) 3.125 MG tablet  LORazepam (ATIVAN) 0.5 MG tablet   [place] [address] [place] Phone:  [phone]  Fax:  [phone]

## 2020-01-22 MED ORDER — CARVEDILOL 3.125 MG PO TABS: 3.1250 mg | ORAL_TABLET | Freq: Two times a day (BID) | ORAL | 3 refills | Status: DC

## 2020-01-22 MED ORDER — LORAZEPAM 0.5 MG PO TABS: 0.2500 mg | ORAL_TABLET | Freq: Every evening | ORAL | 0 refills | Status: DC | PRN

## 2020-01-24 DIAGNOSIS — H353132 Nonexudative age-related macular degeneration, bilateral, intermediate dry stage: Secondary | ICD-10-CM | POA: Diagnosis not present

## 2020-01-24 DIAGNOSIS — H04123 Dry eye syndrome of bilateral lacrimal glands: Secondary | ICD-10-CM | POA: Diagnosis not present

## 2020-02-13 ENCOUNTER — Other Ambulatory Visit: Payer: Self-pay | Admitting: Cardiology

## 2020-02-13 ENCOUNTER — Other Ambulatory Visit: Payer: Self-pay | Admitting: Osteopathic Medicine

## 2020-02-13 NOTE — Telephone Encounter (Signed)
[place] requesting med refills for lorazepam. Last rx issued on 01/22/20.

## 2020-02-14 NOTE — Telephone Encounter (Signed)
Cannot confirm in PDMP.  Can we call pharmacy and confirm when Rx was last picked up?

## 2020-02-17 NOTE — Telephone Encounter (Signed)
At provider's request - contacted [facility] [place]. As per pharmacist, Lorazepam rx was collected from pharmacy on 01/22/20 and before then 12/24/19.

## 2020-03-17 ENCOUNTER — Encounter: Payer: Self-pay | Admitting: Family Medicine

## 2020-03-17 ENCOUNTER — Ambulatory Visit (INDEPENDENT_AMBULATORY_CARE_PROVIDER_SITE_OTHER): Payer: Medicare Other | Admitting: Family Medicine

## 2020-03-17 VITALS — BP 185/79 | HR 68 | Ht 64.96 in | Wt 146.0 lb

## 2020-03-17 DIAGNOSIS — H8112 Benign paroxysmal vertigo, left ear: Secondary | ICD-10-CM | POA: Diagnosis not present

## 2020-03-17 DIAGNOSIS — R42 Dizziness and giddiness: Secondary | ICD-10-CM | POA: Diagnosis not present

## 2020-03-17 NOTE — Assessment & Plan Note (Addendum)
Murmur noted on exam, per patient this is chronic.  Symptoms are not pre-syncopal and denies dyspnea.  Doubt aortic insufficiency.  Will avoid additional medication at this time given age and dementia.  Recommend referral to PT for vestibular rehab, orders entered.

## 2020-03-17 NOTE — Patient Instructions (Signed)

## 2020-03-17 NOTE — Progress Notes (Signed)
Bethany Park - 86 y.o. female MRN [id number]  Date of birth: Sep 19, 1927  Subjective Chief Complaint  Patient presents with  . Dizziness    HPI Bethany Park is a 86 y.o. female here today for same day visit with complaint of intermittent dizziness and L ear pain.  Has symptoms a few times per week.  Describes as spinning sensation.  Worse with head movement, turning over in bed or sitting up.  Denies presyncopal symptoms, nausea, headaches, or tinnitus.  She does have some chronic rhinorrhea.     ROS:  A comprehensive ROS was completed and negative except as noted per HPI  Allergies  Allergen Reactions  . Alendronate Sodium Other (See Comments)  . Benztropine Other (See Comments)  . Citalopram Other (See Comments)  . Duloxetine Hcl Other (See Comments)  . Erythromycin Other (See Comments)  . Fluoxetine Hcl Other (See Comments)  . Glimepiride Other (See Comments)  . Nortriptyline Hcl Other (See Comments)  . Latex Other (See Comments)    Per daughter   . Penicillins Other (See Comments)  . Quetiapine Fumarate Other (See Comments)    As per daughter, hallucinations.     Past Medical History:  Diagnosis Date  . Anemia   . Aortic stenosis   . Atrial fibrillation ([place])   . B12 deficiency 03/13/2018  . Colon polyp 09/2013   in [place]  for personal hx polyps and fm hx colon ca.  found small transverse polyp, diverticulosis, int hemorrhoids.    . Dementia ([place])   . Depression with anxiety 03/13/2018  . Essential hypertension 03/13/2018  . Glucose intolerance 03/13/2018  . Heart attack ([place])   . Heart failure ([place])   . History of COPD   . History of coronary artery stent placement 09/2017  . Moderate aortic stenosis 03/13/2018  . Osteoporosis 03/13/2018  . Ovarian cancer ([place])   . Restless leg syndrome 03/13/2018  . Stroke [facility])     Past Surgical History:  Procedure Laterality Date  . ABDOMINAL HYSTERECTOMY    . BACK SURGERY    . BREAST SURGERY    .  CHOLECYSTECTOMY    . CORONARY STENT PLACEMENT    . ESOPHAGOGASTRODUODENOSCOPY N/A 04/16/2018   Procedure: ESOPHAGOGASTRODUODENOSCOPY (EGD);  Surgeon: Ladene Artist, MD;  Location[facility] ENDOSCOPY;  Service: Endoscopy;  Laterality: N/A;  . EYE SURGERY    . HEMORROIDECTOMY    . HOT HEMOSTASIS N/A 04/16/2018   Procedure: HOT HEMOSTASIS (ARGON PLASMA COAGULATION/BICAP);  Surgeon: Ladene Artist, MD;  Location: [facility] ENDOSCOPY;  Service: Endoscopy;  Laterality: N/A;  . JOINT REPLACEMENT      Social History   Socioeconomic History  . Marital status: Widowed    Spouse name: Not on file  . Number of children: 8  . Years of education: Not on file  . Highest education level: Not on file  Occupational History  . Not on file  Tobacco Use  . Smoking status: Never Smoker  . Smokeless tobacco: Never Used  Substance and Sexual Activity  . Alcohol use: Not Currently  . Drug use: Not Currently  . Sexual activity: Not Currently  Other Topics Concern  . Not on file  Social History Narrative  . Not on file   Social Determinants of Health   Financial Resource Strain:   . Difficulty of Paying Living Expenses:   Food Insecurity:   . Worried About Charity fundraiser in the Last Year:   . [place] in the Last  Year:   Transportation Needs:   . Film/video editor (Medical):   Marland Kitchen Lack of Transportation (Non-Medical):   Physical Activity:   . Days of Exercise per Week:   . Minutes of Exercise per Session:   Stress:   . Feeling of Stress :   Social Connections:   . Frequency of Communication with Friends and Family:   . Frequency of Social Gatherings with Friends and Family:   . Attends Religious Services:   . Active Member of Clubs or Organizations:   . Attends Archivist Meetings:   Marland Kitchen Marital Status:     Family History  Problem Relation Age of Onset  . Heart disease Mother   . Diabetes Mellitus II Mother   . Stroke Mother   . Colon cancer Father     Health  Maintenance  Topic Date Due  . COVID-19 Vaccine (1) Never done  . PNA vac Low Risk Adult (1 of 2 - PCV13) Never done  . HEMOGLOBIN A1C  12/29/2015  . OPHTHALMOLOGY EXAM  07/11/2018  . MAMMOGRAM  09/04/2018  . FOOT EXAM  05/11/2019  . INFLUENZA VACCINE  05/31/2020  . TETANUS/TDAP  12/18/2029  . DEXA SCAN  Completed     ----------------------------------------------------------------------------------------------------------------------------------------------------------------------------------------------------------------- Physical Exam BP (!) 185/79 (BP Location: Left Arm, Patient Position: Sitting, Cuff Size: Normal)   Pulse 68   Ht 5' 4.96" (1.65 m)   Wt 146 lb 0.6 oz (66.2 kg)   SpO2 98%   BMI 24.33 kg/m   Physical Exam Constitutional:      Appearance: Normal appearance.  HENT:     Head: Normocephalic and atraumatic.     Right Ear: Tympanic membrane and ear canal normal.     Left Ear: Tympanic membrane and ear canal normal.  Eyes:     General: No scleral icterus. Cardiovascular:     Rate and Rhythm: Normal rate and regular rhythm.     Heart sounds: Murmur (3/6 SEM) present.  Pulmonary:     Effort: Pulmonary effort is normal.     Breath sounds: Normal breath sounds.  Musculoskeletal:     Cervical back: Neck supple.  Neurological:     General: No focal deficit present.     Mental Status: She is alert.  Psychiatric:        Mood and Affect: Mood normal.        Behavior: Behavior normal.     ------------------------------------------------------------------------------------------------------------------------------------------------------------------------------------------------------------------- Assessment and Plan  Vertigo Murmur noted on exam, per patient this is chronic.  Symptoms are not pre-syncopal and denies dyspnea.  Doubt aortic insufficiency.  Will avoid additional medication at this time given age and dementia.  Recommend referral to PT for  vestibular rehab, orders entered.     No orders of the defined types were placed in this encounter.   No follow-ups on file.    This visit occurred during the SARS-CoV-2 public health emergency.  Safety protocols were in place, including screening questions prior to the visit, additional usage of staff PPE, and extensive cleaning of exam room while observing appropriate contact time as indicated for disinfecting solutions.

## 2020-03-19 ENCOUNTER — Other Ambulatory Visit: Payer: Self-pay

## 2020-03-19 ENCOUNTER — Ambulatory Visit (INDEPENDENT_AMBULATORY_CARE_PROVIDER_SITE_OTHER): Payer: Medicare Other | Admitting: Rehabilitative and Restorative Service Providers"

## 2020-03-19 DIAGNOSIS — R2689 Other abnormalities of gait and mobility: Secondary | ICD-10-CM | POA: Diagnosis not present

## 2020-03-19 DIAGNOSIS — H8111 Benign paroxysmal vertigo, right ear: Secondary | ICD-10-CM | POA: Diagnosis not present

## 2020-03-19 NOTE — Patient Instructions (Signed)
Access Code: [email or web address] URL: [email or web address] Date: 03/19/2020 Prepared by: Rudell Cobb  Exercises Heel rises with counter support - 2 x daily - 7 x weekly - 10 reps - 1 sets Seated Ankle Dorsiflexion AROM - 2 x daily - 7 x weekly - 10 reps - 1 sets Seated Long Arc Quad - 2 x daily - 7 x weekly - 10 reps - 1 sets

## 2020-03-19 NOTE — Therapy (Addendum)
[facility] [place], [place], [address] Phone: [phone]   Fax:  [phone]  Physical Therapy Evaluation and Discharge Summary  Patient Details  Name: Bethany Park MRN: [id number] Date of Birth: 04/12/1927 Referring Provider (PT): Brock Ra, DO     Patient did not return to PT.  Please refer to initial evaluation for patient status. Thank you for the referral of this patient. Rudell Cobb, MPT   Encounter Date: 03/19/2020  PT End of Session - 03/19/20 1706    Visit Number  1    Number of Visits  4    Date for PT Re-Evaluation  04/18/20    PT Start Time  1620    PT Stop Time  1700    PT Time Calculation (min)  40 min    Activity Tolerance  Patient tolerated treatment well    Behavior During Therapy  [facility] for tasks assessed/performed       Past Medical History:  Diagnosis Date  . Anemia   . Aortic stenosis   . Atrial fibrillation ([place])   . B12 deficiency 03/13/2018  . Colon polyp 09/2013   in [place] state.  for personal hx polyps and fm hx colon ca.  found small transverse polyp, diverticulosis, int hemorrhoids.    . Dementia ([place])   . Depression with anxiety 03/13/2018  . Essential hypertension 03/13/2018  . Glucose intolerance 03/13/2018  . Heart attack ([place])   . Heart failure ([place])   . History of COPD   . History of coronary artery stent placement 09/2017  . Moderate aortic stenosis 03/13/2018  . Osteoporosis 03/13/2018  . Ovarian cancer ([place])   . Restless leg syndrome 03/13/2018  . Stroke [facility])     Past Surgical History:  Procedure Laterality Date  . ABDOMINAL HYSTERECTOMY    . BACK SURGERY    . BREAST SURGERY    . CHOLECYSTECTOMY    . CORONARY STENT PLACEMENT    . ESOPHAGOGASTRODUODENOSCOPY N/A 04/16/2018   Procedure: ESOPHAGOGASTRODUODENOSCOPY (EGD);  Surgeon: Ladene Artist, MD;  Location: [facility] ENDOSCOPY;  Service: Endoscopy;  Laterality: N/A;  . EYE SURGERY    .  HEMORROIDECTOMY    . HOT HEMOSTASIS N/A 04/16/2018   Procedure: HOT HEMOSTASIS (ARGON PLASMA COAGULATION/BICAP);  Surgeon: Ladene Artist, MD;  Location: [facility] ENDOSCOPY;  Service: Endoscopy;  Laterality: N/A;  . JOINT REPLACEMENT      There were no vitals filed for this visit.   Subjective Assessment - 03/19/20 1628    Subjective  The patient reports dizziness x 1-2 months when moving her head, rolling and sometimes "I don't have to do anything".  The room spins "like a whirlwind", lasting for minutes and then it goes away.    Patient Stated Goals  reduce dizziness    Currently in Pain?  No/denies         [facility] PT Assessment - 03/19/20 1629      Assessment   Medical Diagnosis  bppv    Referring Provider (PT)  Brock Ra, DO    Onset Date/Surgical Date  --   1-2 months ago   Prior Therapy  none      Precautions   Precautions  Fall      Restrictions   Weight Bearing Restrictions  No      Balance Screen   Has the patient fallen in the past 6 months  Yes    How many times?   unsure- fell last week and reports  frequent falls    Has the patient had a decrease in activity level because of a fear of falling?   Yes    Is the patient reluctant to leave their home because of a fear of falling?   Yes   she is accompanied by her daughter to leave home     [place] to enter    Entrance Stairs-Number of Steps  2    Entrance Stairs-Rails  --   uses the wall or walker   Home Layout  --   1 step into bedroom, rec room   [place] - 2 wheels   power chair indoors (in bedroom)     Prior Function   Level of Independence  Needs assistance with gait;Needs assistance with ADLs      Ambulation/Gait   Ambulation/Gait  Yes    Ambulation/Gait Assistance  5: Supervision    Ambulation Distance (Feet)  75 Feet    Assistive device  Rolling walker     Gait Pattern  Decreased stride length;Shuffle;Trunk flexed             Vestibular Assessment - 03/19/20 1633      Vestibular Assessment   General Observation  The patient uses RW to ambulate into clinic.      Symptom Behavior   Subjective history of current problem  1-2 months ago onset of room spinning    Type of Dizziness   Spinning;Unsteady with head/body turns    Frequency of Dizziness  daily    Duration of Dizziness  minutes    Symptom Nature  Motion provoked    Aggravating Factors  Activity in general;Looking up to the ceiling    Relieving Factors  Head stationary    Progression of Symptoms  Worse      Oculomotor Exam   Oculomotor Alignment  Normal    Spontaneous  Absent    Gaze-induced   Absent    Smooth Pursuits  Saccades   minimal saccades noted moving to the right   Saccades  Intact      Positional Testing   Dix-Hallpike  Dix-Hallpike Right;Dix-Hallpike Left    Sidelying Test  Sidelying Right;Sidelying Left    Horizontal Canal Testing  Horizontal Canal Right;Horizontal Canal Left      Dix-Hallpike Right   Dix-Hallpike Right Duration  reports mild dizziness; "fogginess" with some environmental movement    Dix-Hallpike Right Symptoms  --   not viewed in room light; patient closes eyes     Dix-Hallpike Left   Dix-Hallpike Left Duration  none    Dix-Hallpike Left Symptoms  No nystagmus      Sidelying Right   Sidelying Right Duration  none    Sidelying Right Symptoms  No nystagmus      Sidelying Left   Sidelying Left Duration  none    Sidelying Left Symptoms  No nystagmus      Horizontal Canal Right   Horizontal Canal Right Duration  none    Horizontal Canal Right Symptoms  Normal      Horizontal Canal Left   Horizontal Canal Left Duration  none    Horizontal Canal Left Symptoms  Normal          Objective measurements completed on examination: See above findings.      [facility] Adult PT Treatment/Exercise -  03/19/20 2118      Neuro Re-ed     Neuro Re-ed Details   PT provided components of [place] fall prevention HEP at today's session including:  Long arc quads, ankle pumps, standing heel raises, standing toe raises.  We also perfomred sit<>stand, however she needed CGA for this , therefore did not provide for HEP.       Vestibular Treatment/Exercise - 03/19/20 2117      Vestibular Treatment/Exercise   Vestibular Treatment Provided  Canalith Repositioning    Canalith Repositioning  Epley Manuever Right       EPLEY MANUEVER RIGHT   Number of Reps   1    Response Details   performed 1 rep without nystagmus viewed in room light *modified position to have pillow under thoracic spine to avoid extending head over edge of table.            PT Education - 03/19/20 1656    Education Details  HEP    Person(s) Educated  Patient;Child(ren);Caregiver(s)    Methods  Explanation;Demonstration;Handout    Comprehension  Verbalized understanding;Returned demonstration          PT Long Term Goals - 03/19/20 1707      PT LONG TERM GOAL #1   Title  The patient will perform HEP for balance with assist from her daughter.    Time  4    Period  Weeks    Target Date  04/18/20      PT LONG TERM GOAL #2   Title  The patient will report no dizziness with bed mobility.    Time  4    Period  Weeks    Target Date  04/18/20             Plan - 03/19/20 2120    Clinical Impression Statement  The patient is a 86 yo female referred to OP physical therapy with BPPV.  She notes 1-2 month h/o vertigo, however reports improvement in the past 2 days.  Nystagmus were not viewed in room light with positional testing, however the patient has increased symtpoms with R dix hallpike (modified due to dec'd mobility).  PT treated with Epley's due to symptoms and also instructed the patient and her daughter in beginning components of [place] balance program due to frequent falls.  Plan to reassess for vertigo next visit and treat as indicated/  progress [place] program to tolerance.    Personal Factors and Comorbidities  Comorbidity 1;Comorbidity 2;Comorbidity 3+    Comorbidities  diabetes, h/o stroke, frequent falls    Examination-Activity Limitations  Bed Mobility;Bend;Locomotion Level    Stability/Clinical Decision Making  Stable/Uncomplicated    Clinical Decision Making  Low    Rehab Potential  Good    PT Frequency  1x / week    PT Duration  4 weeks    PT Treatment/Interventions  ADLs/Self Care Home Management;Gait training;Stair training;Canalith Repostioning;Vestibular;Functional mobility training;Therapeutic activities;Therapeutic exercise;Balance training;Neuromuscular re-education;Patient/family education    PT Next Visit Plan  check positional testing, progress [place] fall prevention program    Consulted and Agree with Plan of Care  Patient       Patient will benefit from skilled therapeutic intervention in order to improve the following deficits and impairments:  Dizziness, Decreased balance, Decreased activity tolerance, Difficulty walking  Visit Diagnosis: BPPV (benign paroxysmal positional vertigo), right  Other abnormalities of gait and mobility     Problem List Patient Active Problem List   Diagnosis Date Noted  . Vertigo 03/17/2020  .  Laceration of scalp 12/24/2019  . Metatarsalgia of both feet 12/24/2019  . Subarachnoid hemorrhage ([place]) 11/11/2018  . Laceration of right forearm 05/11/2018  . Sundowning 05/11/2018  . Chronic low back pain 04/26/2018  . Chronic right hip pain 04/26/2018  . Melena   . UGIB (upper gastrointestinal bleed) 04/15/2018  . Anemia, blood loss 04/15/2018  . Depression 04/15/2018  . CAD (coronary artery disease) 04/15/2018  . Fall 04/15/2018  . Hyponatremia 04/15/2018  . Acute renal failure superimposed on stage 3 chronic kidney disease ([place]) 04/15/2018  . UTI (urinary tract infection) 04/15/2018  . Hypotension   . Ambulatory dysfunction 04/03/2018  . Generalized abdominal  pain 03/28/2018  . 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  . 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 Diabetic macular edema ([place]) 03/13/2018  . Mild nonproliferative diabetic retinopathy ([place]) 03/13/2018  . Atrial fibrillation ([place]) 03/13/2018  . Essential hypertension 03/13/2018  . History of coronary artery stent placement 03/13/2018  . ASCVD (arteriosclerotic cardiovascular disease) 03/13/2018  . Moderate aortic stenosis 03/13/2018  . Dementia ([place]) 03/13/2018  . HLD (hyperlipidemia) 03/13/2018  . B12 deficiency 03/13/2018  . Osteoporosis 03/13/2018  . Mild sleep apnea 03/13/2018  . Colon polyp 03/13/2018  . Restless leg syndrome 03/13/2018  . Macular degeneration 03/13/2018  . Anemia 03/13/2018  . Depression with anxiety 03/13/2018  . Overactive bladder 03/13/2018  . History of CVA (cerebrovascular accident) 03/13/2018  . Glucose intolerance 03/13/2018  . Crohn disease ([place]) 03/13/2018    [place], [place] 03/19/2020, 9:30 PM  [facility] [place], [place], [address] Phone: [phone]   Fax:  [phone]  Name: Bethany Park MRN: [id number] Date of Birth: 10-Jan-1927

## 2020-03-26 ENCOUNTER — Other Ambulatory Visit: Payer: Self-pay | Admitting: Cardiology

## 2020-03-27 ENCOUNTER — Encounter: Payer: Medicare Other | Admitting: Rehabilitative and Restorative Service Providers"

## 2020-04-05 ENCOUNTER — Other Ambulatory Visit: Payer: Self-pay | Admitting: Cardiology

## 2020-05-19 ENCOUNTER — Ambulatory Visit (INDEPENDENT_AMBULATORY_CARE_PROVIDER_SITE_OTHER): Payer: Medicare Other

## 2020-05-19 ENCOUNTER — Encounter: Payer: Self-pay | Admitting: Osteopathic Medicine

## 2020-05-19 ENCOUNTER — Ambulatory Visit (INDEPENDENT_AMBULATORY_CARE_PROVIDER_SITE_OTHER): Payer: Medicare Other | Admitting: Osteopathic Medicine

## 2020-05-19 VITALS — BP 215/100 | HR 87 | Wt 144.2 lb

## 2020-05-19 DIAGNOSIS — G8929 Other chronic pain: Secondary | ICD-10-CM

## 2020-05-19 DIAGNOSIS — M47816 Spondylosis without myelopathy or radiculopathy, lumbar region: Secondary | ICD-10-CM | POA: Diagnosis not present

## 2020-05-19 DIAGNOSIS — M5442 Lumbago with sciatica, left side: Secondary | ICD-10-CM

## 2020-05-19 DIAGNOSIS — I10 Essential (primary) hypertension: Secondary | ICD-10-CM

## 2020-05-19 DIAGNOSIS — M1612 Unilateral primary osteoarthritis, left hip: Secondary | ICD-10-CM | POA: Diagnosis not present

## 2020-05-19 DIAGNOSIS — M25552 Pain in left hip: Secondary | ICD-10-CM

## 2020-05-19 DIAGNOSIS — M545 Low back pain: Secondary | ICD-10-CM | POA: Diagnosis not present

## 2020-05-19 MED ORDER — LORAZEPAM 0.5 MG PO TABS: 0.2500 mg | ORAL_TABLET | Freq: Every evening | ORAL | 0 refills | Status: DC | PRN

## 2020-05-19 MED ORDER — CARVEDILOL 3.125 MG PO TABS: 3.1250 mg | ORAL_TABLET | Freq: Two times a day (BID) | ORAL | 3 refills | Status: DC

## 2020-05-19 MED ORDER — OXYCODONE-ACETAMINOPHEN 5-325 MG PO TABS: 1.0000 | ORAL_TABLET | Freq: Four times a day (QID) | ORAL | 0 refills | Status: DC | PRN

## 2020-05-19 NOTE — Progress Notes (Signed)
Bethany Park is a 86 y.o. female who presents to  [place] [facility]  today, 05/19/20, seeking care for the following:  . L lower back and hip pain going into L knee, ongoing years but worse past few weeks, no fall or injury. On exam, limited L hip ROM d/t pain, SLR on L elicits tingling in L foot no shooting pain, difficult to assess strength symmetry d/t pain, but 4/4 all fields on L and R but diminished somewhat on L. Has declined PT in the past. Has declined assisted living/ rehab. Has not been wearing proper footwear as advised by sports med      ASSESSMENT & PLAN with other pertinent findings:  The primary encounter diagnosis was Chronic left-sided low back pain with left-sided sciatica. Diagnoses of Essential hypertension and Left hip pain were also pertinent to this visit.    There are no Patient Instructions on file for this visit.  Orders Placed This Encounter  Procedures  . DG Lumbar Spine Complete  . DG Hip Unilat W OR W/O Pelvis 2-3 Views Left  . Ambulatory referral to [place] ordered this encounter  Medications  . carvedilol (COREG) 3.125 MG tablet    Sig: Take 1 tablet (3.125 mg total) by mouth 2 (two) times daily with a meal.    Dispense:  180 tablet    Refill:  3  . oxyCODONE-acetaminophen (PERCOCET/ROXICET) 5-325 MG tablet    Sig: Take 1 tablet by mouth every 6 (six) hours as needed for severe pain.    Dispense:  30 tablet    Refill:  0  . LORazepam (ATIVAN) 0.5 MG tablet    Sig: Take 0.5-1 tablets (0.25-0.5 mg total) by mouth at bedtime as needed for anxiety or sleep (#90 for 90 days).    Dispense:  90 tablet    Refill:  0       Follow-up instructions: Return if symptoms worsen or fail to improve please see Dr T.                                         BP (!) 215/100 (BP Location: Right Arm, Patient Position: Sitting)   Pulse 87   Wt 144 lb 3.2 oz  (65.4 kg)   SpO2 96%   BMI 24.02 kg/m   Current Meds  Medication Sig  . albuterol (PROVENTIL HFA;VENTOLIN HFA) 108 (90 Base) MCG/ACT inhaler Inhale 2 puffs into the lungs every 6 (six) hours as needed for wheezing.  . [facility] bed with rails  . AMBULATORY NON FORMULARY MEDICATION Wheelchair  . AMBULATORY NON FORMULARY MEDICATION Physical therapy eval and treat for dx: ambulatory dysfunction, charcot marie tooth syndrome, frequent falls. Fax attn: [facility] [phone]  . amitriptyline (ELAVIL) 25 MG tablet Take 1 tablet (25 mg total) by mouth at bedtime.  [place] Kitchen atorvastatin (LIPITOR) 40 MG tablet Take 1 tablet (40 mg total) by mouth daily.  . carvedilol (COREG) 3.125 MG tablet Take 1 tablet (3.125 mg total) by mouth 2 (two) times daily with a meal.  . clopidogrel (PLAVIX) 75 MG tablet Take 1 tablet (75 mg total) by mouth daily. Please schedule annual appt with Dr.Crenshaw for refills. [phone]. 1st attempt.  . famotidine (PEPCID) 40 MG tablet Take 1 tablet (40 mg total) by mouth daily.  [place] Kitchen HYDROcodone-acetaminophen (NORCO) 5-325 MG  tablet Take 1 tablet by mouth every 6 (six) hours as needed for severe pain. #45 for 30 days  . lisinopril (ZESTRIL) 2.5 MG tablet Take 1 tablet (2.5 mg total) by mouth daily.  [place] Kitchen LORazepam (ATIVAN) 0.5 MG tablet Take 0.5-1 tablets (0.25-0.5 mg total) by mouth at bedtime as needed for anxiety or sleep (#90 for 90 days).  . lubiprostone (AMITIZA) 8 MCG capsule Take 1 capsule (8 mcg total) by mouth 2 (two) times daily with a meal. Start day after next bowel movement  . Melatonin 10 MG SUBL Place 10 mg under the tongue at bedtime.  . nitroGLYCERIN (NITROSTAT) 0.4 MG SL tablet Place 1 tablet (0.4 mg total) under the tongue every 5 (five) minutes as needed for chest pain. If no better 10-15 mins, go to hospital  . nystatin (MYCOSTATIN/NYSTOP) powder Apply topically 4 (four) times daily.  [place] Kitchen rOPINIRole (REQUIP) 3 MG tablet Take 1  tablet (3 mg total) by mouth 3 (three) times daily.  Marland Kitchen umeclidinium-vilanterol (ANORO ELLIPTA) 62.5-25 MCG/INH AEPB Inhale 1 puff into the lungs daily.  . [DISCONTINUED] carvedilol (COREG) 3.125 MG tablet Take 1 tablet (3.125 mg total) by mouth 2 (two) times daily with a meal.  . [DISCONTINUED] LORazepam (ATIVAN) 0.5 MG tablet Take 0.5-1 tablets (0.25-0.5 mg total) by mouth at bedtime as needed for anxiety or sleep (#90 for 90 days).    No results found for this or any previous visit (from the past 72 hour(s)).  No results found.     All questions at time of visit were answered - patient instructed to contact office with any additional concerns or updates.  ER/RTC precautions were reviewed with the patient as applicable.   Please note: voice recognition software was used to produce this document, and typos may escape review. Please contact Dr. Sheppard Coil for any needed clarifications.   Total encounter time: 30 minutes.

## 2020-05-21 ENCOUNTER — Other Ambulatory Visit: Payer: Self-pay | Admitting: Osteopathic Medicine

## 2020-05-21 DIAGNOSIS — M25552 Pain in left hip: Secondary | ICD-10-CM | POA: Diagnosis not present

## 2020-05-21 DIAGNOSIS — I10 Essential (primary) hypertension: Secondary | ICD-10-CM | POA: Diagnosis not present

## 2020-05-21 DIAGNOSIS — M5442 Lumbago with sciatica, left side: Secondary | ICD-10-CM | POA: Diagnosis not present

## 2020-05-21 DIAGNOSIS — G8929 Other chronic pain: Secondary | ICD-10-CM | POA: Diagnosis not present

## 2020-05-22 ENCOUNTER — Telehealth: Payer: Self-pay

## 2020-05-22 ENCOUNTER — Other Ambulatory Visit: Payer: Self-pay

## 2020-05-22 DIAGNOSIS — I10 Essential (primary) hypertension: Secondary | ICD-10-CM

## 2020-05-22 MED ORDER — CARVEDILOL 3.125 MG PO TABS: 3.1250 mg | ORAL_TABLET | Freq: Two times a day (BID) | ORAL | 3 refills | Status: DC

## 2020-05-22 NOTE — Telephone Encounter (Signed)
[facility] left a voicemail concerning patients blood pressure during home visit. Verbal orders were given per Dr. Sheppard Coil, blood pressure medication has been sent to the pharmacy.

## 2020-05-27 DIAGNOSIS — G8929 Other chronic pain: Secondary | ICD-10-CM | POA: Diagnosis not present

## 2020-05-27 DIAGNOSIS — M25552 Pain in left hip: Secondary | ICD-10-CM | POA: Diagnosis not present

## 2020-05-27 DIAGNOSIS — M5442 Lumbago with sciatica, left side: Secondary | ICD-10-CM | POA: Diagnosis not present

## 2020-05-27 DIAGNOSIS — I10 Essential (primary) hypertension: Secondary | ICD-10-CM | POA: Diagnosis not present

## 2020-05-29 DIAGNOSIS — M5442 Lumbago with sciatica, left side: Secondary | ICD-10-CM | POA: Diagnosis not present

## 2020-05-29 DIAGNOSIS — G8929 Other chronic pain: Secondary | ICD-10-CM | POA: Diagnosis not present

## 2020-05-29 DIAGNOSIS — M25552 Pain in left hip: Secondary | ICD-10-CM | POA: Diagnosis not present

## 2020-05-29 DIAGNOSIS — I10 Essential (primary) hypertension: Secondary | ICD-10-CM | POA: Diagnosis not present

## 2020-05-31 DIAGNOSIS — M25552 Pain in left hip: Secondary | ICD-10-CM | POA: Diagnosis not present

## 2020-05-31 DIAGNOSIS — G8929 Other chronic pain: Secondary | ICD-10-CM | POA: Diagnosis not present

## 2020-05-31 DIAGNOSIS — M5442 Lumbago with sciatica, left side: Secondary | ICD-10-CM | POA: Diagnosis not present

## 2020-05-31 DIAGNOSIS — I10 Essential (primary) hypertension: Secondary | ICD-10-CM | POA: Diagnosis not present

## 2020-06-01 ENCOUNTER — Ambulatory Visit: Payer: TRICARE For Life (TFL) | Admitting: Sports Medicine

## 2020-06-01 DIAGNOSIS — I10 Essential (primary) hypertension: Secondary | ICD-10-CM | POA: Diagnosis not present

## 2020-06-01 DIAGNOSIS — M25552 Pain in left hip: Secondary | ICD-10-CM | POA: Diagnosis not present

## 2020-06-01 DIAGNOSIS — G8929 Other chronic pain: Secondary | ICD-10-CM | POA: Diagnosis not present

## 2020-06-01 DIAGNOSIS — M5442 Lumbago with sciatica, left side: Secondary | ICD-10-CM | POA: Diagnosis not present

## 2020-06-02 DIAGNOSIS — M25552 Pain in left hip: Secondary | ICD-10-CM | POA: Diagnosis not present

## 2020-06-02 DIAGNOSIS — M5442 Lumbago with sciatica, left side: Secondary | ICD-10-CM | POA: Diagnosis not present

## 2020-06-02 DIAGNOSIS — G8929 Other chronic pain: Secondary | ICD-10-CM | POA: Diagnosis not present

## 2020-06-02 DIAGNOSIS — I10 Essential (primary) hypertension: Secondary | ICD-10-CM | POA: Diagnosis not present

## 2020-06-03 ENCOUNTER — Telehealth: Payer: Self-pay

## 2020-06-03 NOTE — Telephone Encounter (Signed)
Home health agency called to receive verbal order for patient to receive home health visits twice a week for 3 weeks. Verbal order were given.  FYI

## 2020-06-04 DIAGNOSIS — G8929 Other chronic pain: Secondary | ICD-10-CM | POA: Diagnosis not present

## 2020-06-04 DIAGNOSIS — M5442 Lumbago with sciatica, left side: Secondary | ICD-10-CM | POA: Diagnosis not present

## 2020-06-04 DIAGNOSIS — I10 Essential (primary) hypertension: Secondary | ICD-10-CM | POA: Diagnosis not present

## 2020-06-04 DIAGNOSIS — M25552 Pain in left hip: Secondary | ICD-10-CM | POA: Diagnosis not present

## 2020-06-04 NOTE — Telephone Encounter (Signed)
Agree with verbal order

## 2020-06-09 DIAGNOSIS — M5442 Lumbago with sciatica, left side: Secondary | ICD-10-CM | POA: Diagnosis not present

## 2020-06-09 DIAGNOSIS — I10 Essential (primary) hypertension: Secondary | ICD-10-CM | POA: Diagnosis not present

## 2020-06-09 DIAGNOSIS — G8929 Other chronic pain: Secondary | ICD-10-CM | POA: Diagnosis not present

## 2020-06-09 DIAGNOSIS — M25552 Pain in left hip: Secondary | ICD-10-CM | POA: Diagnosis not present

## 2020-06-11 DIAGNOSIS — M5442 Lumbago with sciatica, left side: Secondary | ICD-10-CM | POA: Diagnosis not present

## 2020-06-11 DIAGNOSIS — G8929 Other chronic pain: Secondary | ICD-10-CM | POA: Diagnosis not present

## 2020-06-11 DIAGNOSIS — I10 Essential (primary) hypertension: Secondary | ICD-10-CM | POA: Diagnosis not present

## 2020-06-11 DIAGNOSIS — M25552 Pain in left hip: Secondary | ICD-10-CM | POA: Diagnosis not present

## 2020-06-17 DIAGNOSIS — I10 Essential (primary) hypertension: Secondary | ICD-10-CM | POA: Diagnosis not present

## 2020-06-17 DIAGNOSIS — G8929 Other chronic pain: Secondary | ICD-10-CM | POA: Diagnosis not present

## 2020-06-17 DIAGNOSIS — M5442 Lumbago with sciatica, left side: Secondary | ICD-10-CM | POA: Diagnosis not present

## 2020-06-17 DIAGNOSIS — M25552 Pain in left hip: Secondary | ICD-10-CM | POA: Diagnosis not present

## 2020-06-18 ENCOUNTER — Ambulatory Visit: Payer: Self-pay

## 2020-06-18 ENCOUNTER — Ambulatory Visit (INDEPENDENT_AMBULATORY_CARE_PROVIDER_SITE_OTHER): Payer: Medicare Other | Admitting: Sports Medicine

## 2020-06-18 DIAGNOSIS — M48061 Spinal stenosis, lumbar region without neurogenic claudication: Secondary | ICD-10-CM

## 2020-06-18 DIAGNOSIS — M4807 Spinal stenosis, lumbosacral region: Secondary | ICD-10-CM | POA: Diagnosis not present

## 2020-06-18 MED ORDER — OXYCODONE-ACETAMINOPHEN 5-325 MG PO TABS: 1.0000 | ORAL_TABLET | Freq: Two times a day (BID) | ORAL | 0 refills | Status: DC | PRN

## 2020-06-18 NOTE — Addendum Note (Signed)
Addended by: Cyd Silence on: 06/18/2020 02:28 PM   Modules accepted: Orders

## 2020-06-18 NOTE — Addendum Note (Signed)
Addended by: Cyd Silence on: 06/18/2020 02:37 PM   Modules accepted: Orders

## 2020-06-18 NOTE — Assessment & Plan Note (Signed)
This is a very pleasant 86 year old female, for a long time now she said pain in her back with radiation down the left leg, back of the calf to all of her toes. Worse with straightening out her back. No progressive weakness, she has had x-rays, conservative treatment including narcotics and gabapentin. We are going to proceed with an MRI for planning an epidural. I am going to double her pain medicine dose for now, return to see me to go over MRI results.

## 2020-06-18 NOTE — Progress Notes (Signed)
    Procedures performed today:    None.  Independent interpretation of notes and tests performed by another provider:   CT scan of the abdomen and pelvis from 2019 reviewed and shows fairly severe spinal stenosis at the L1-L2 level as well as the L3-L4 level.  There is also widespread facet arthritis at multiple other levels, lumbar spine x-rays and hip x-rays show osteoarthritis all as expected.  Brief History, Exam, Impression, and Recommendations:    Lumbar spinal stenosis This is a very pleasant 86 year old female, for a long time now she said pain in her back with radiation down the left leg, back of the calf to all of her toes. Worse with straightening out her back. No progressive weakness, she has had x-rays, conservative treatment including narcotics and gabapentin. We are going to proceed with an MRI for planning an epidural. I am going to double her pain medicine dose for now, return to see me to go over MRI results.    ___________________________________________ Gwen Her. Dianah Field, M.D., ABFM., CAQSM. Primary Care and [place] Instructor of [place] of [facility] of Medicine

## 2020-06-19 DIAGNOSIS — I10 Essential (primary) hypertension: Secondary | ICD-10-CM | POA: Diagnosis not present

## 2020-06-19 DIAGNOSIS — M25552 Pain in left hip: Secondary | ICD-10-CM | POA: Diagnosis not present

## 2020-06-19 DIAGNOSIS — M5442 Lumbago with sciatica, left side: Secondary | ICD-10-CM | POA: Diagnosis not present

## 2020-06-19 DIAGNOSIS — G8929 Other chronic pain: Secondary | ICD-10-CM | POA: Diagnosis not present

## 2020-06-20 DIAGNOSIS — M5442 Lumbago with sciatica, left side: Secondary | ICD-10-CM | POA: Diagnosis not present

## 2020-07-05 ENCOUNTER — Other Ambulatory Visit: Payer: Self-pay

## 2020-07-05 ENCOUNTER — Ambulatory Visit: Payer: Medicare Other

## 2020-07-07 ENCOUNTER — Other Ambulatory Visit: Payer: Self-pay

## 2020-07-07 ENCOUNTER — Ambulatory Visit (INDEPENDENT_AMBULATORY_CARE_PROVIDER_SITE_OTHER): Payer: Medicare Other

## 2020-07-07 DIAGNOSIS — M4807 Spinal stenosis, lumbosacral region: Secondary | ICD-10-CM | POA: Diagnosis not present

## 2020-07-07 DIAGNOSIS — M48061 Spinal stenosis, lumbar region without neurogenic claudication: Secondary | ICD-10-CM | POA: Diagnosis not present

## 2020-07-22 ENCOUNTER — Ambulatory Visit (INDEPENDENT_AMBULATORY_CARE_PROVIDER_SITE_OTHER): Payer: Medicare Other

## 2020-07-22 ENCOUNTER — Other Ambulatory Visit: Payer: Self-pay

## 2020-07-22 ENCOUNTER — Ambulatory Visit (INDEPENDENT_AMBULATORY_CARE_PROVIDER_SITE_OTHER): Payer: Medicare Other | Admitting: Sports Medicine

## 2020-07-22 DIAGNOSIS — M19012 Primary osteoarthritis, left shoulder: Secondary | ICD-10-CM | POA: Diagnosis not present

## 2020-07-22 DIAGNOSIS — I700 Atherosclerosis of aorta: Secondary | ICD-10-CM | POA: Diagnosis not present

## 2020-07-22 DIAGNOSIS — M48061 Spinal stenosis, lumbar region without neurogenic claudication: Secondary | ICD-10-CM

## 2020-07-22 DIAGNOSIS — M810 Age-related osteoporosis without current pathological fracture: Secondary | ICD-10-CM | POA: Diagnosis not present

## 2020-07-22 MED ORDER — OXYCODONE-ACETAMINOPHEN 10-325 MG PO TABS: 1.0000 | ORAL_TABLET | Freq: Three times a day (TID) | ORAL | 0 refills | Status: DC | PRN

## 2020-07-22 NOTE — Assessment & Plan Note (Signed)
This is a pleasant 86 year old female, she has multilevel lumbar spinal stenosis worst at the L3-L4 level, she has unfortunately failed conservative treatment so we are going to proceed with a lumbar epidural, left L3-L4 interlaminar with Dr. Francesco Runner, I am going to also increase her narcotic pain medication. Return to see me 1 month after the injection. We did discuss the incurable nature of spinal stenosis and I did set some expectations with her.

## 2020-07-22 NOTE — Progress Notes (Signed)
    Procedures performed today:    Procedure: Real-time Ultrasound Guided injection of the left glenohumeral joint Device: Samsung HS60  Verbal informed consent obtained.  Time-out conducted.  Noted no overlying erythema, induration, or other signs of local infection.  Skin prepped in a sterile fashion.  Local anesthesia: Topical Ethyl chloride.  With sterile technique and under real time ultrasound guidance: 1 cc Kenalog 40, 2 cc lidocaine, 2 cc bupivacaine injected easily Completed without difficulty  Pain immediately resolved suggesting accurate placement of the medication.  Advised to call if fevers/chills, erythema, induration, drainage, or persistent bleeding.  Images permanently stored and available for review in PACS.  Impression: Technically successful ultrasound guided injection.  Independent interpretation of notes and tests performed by another provider:   None.  Brief History, Exam, Impression, and Recommendations:    Lumbar spinal stenosis This is a pleasant 86 year old female, she has multilevel lumbar spinal stenosis worst at the L3-L4 level, she has unfortunately failed conservative treatment so we are going to proceed with a lumbar epidural, left L3-L4 interlaminar with Dr. Francesco Runner, I am going to also increase her narcotic pain medication. Return to see me 1 month after the injection. We did discuss the incurable nature of spinal stenosis and I did set some expectations with her.  Primary osteoarthritis of left shoulder There is also severe pain in the left shoulder, she has known osteoarthritis, and there was a plan for shoulder arthroplasty. Because she is having persistence of pain in spite of her medications and she does not desire surgery we will do a glenohumeral injection today. Adding home health physical therapy for the shoulder and the back. Return to see me in 1 month.    ___________________________________________ Gwen Her. Dianah Field, M.D.,  ABFM., CAQSM. Primary Care and [place] Instructor of [place] of [facility] of Medicine

## 2020-07-22 NOTE — Assessment & Plan Note (Addendum)
There is also severe pain in the left shoulder, she has known osteoarthritis, and there was a plan for shoulder arthroplasty. Because she is having persistence of pain in spite of her medications and she does not desire surgery we will do a glenohumeral injection today. Adding home health physical therapy for the shoulder and the back. Return to see me in 1 month.

## 2020-07-26 DIAGNOSIS — Z9181 History of falling: Secondary | ICD-10-CM | POA: Diagnosis not present

## 2020-07-26 DIAGNOSIS — Z79891 Long term (current) use of opiate analgesic: Secondary | ICD-10-CM | POA: Diagnosis not present

## 2020-07-26 DIAGNOSIS — M5442 Lumbago with sciatica, left side: Secondary | ICD-10-CM | POA: Diagnosis not present

## 2020-07-26 DIAGNOSIS — G8929 Other chronic pain: Secondary | ICD-10-CM | POA: Diagnosis not present

## 2020-07-26 DIAGNOSIS — M19012 Primary osteoarthritis, left shoulder: Secondary | ICD-10-CM | POA: Diagnosis not present

## 2020-07-26 DIAGNOSIS — M25552 Pain in left hip: Secondary | ICD-10-CM | POA: Diagnosis not present

## 2020-07-26 DIAGNOSIS — I10 Essential (primary) hypertension: Secondary | ICD-10-CM | POA: Diagnosis not present

## 2020-07-26 DIAGNOSIS — M48061 Spinal stenosis, lumbar region without neurogenic claudication: Secondary | ICD-10-CM | POA: Diagnosis not present

## 2020-07-27 DIAGNOSIS — M48061 Spinal stenosis, lumbar region without neurogenic claudication: Secondary | ICD-10-CM | POA: Diagnosis not present

## 2020-07-27 DIAGNOSIS — M25552 Pain in left hip: Secondary | ICD-10-CM | POA: Diagnosis not present

## 2020-07-27 DIAGNOSIS — M5442 Lumbago with sciatica, left side: Secondary | ICD-10-CM | POA: Diagnosis not present

## 2020-07-27 DIAGNOSIS — H353132 Nonexudative age-related macular degeneration, bilateral, intermediate dry stage: Secondary | ICD-10-CM | POA: Diagnosis not present

## 2020-07-27 DIAGNOSIS — H04123 Dry eye syndrome of bilateral lacrimal glands: Secondary | ICD-10-CM | POA: Diagnosis not present

## 2020-07-27 DIAGNOSIS — G8929 Other chronic pain: Secondary | ICD-10-CM | POA: Diagnosis not present

## 2020-07-27 DIAGNOSIS — M19012 Primary osteoarthritis, left shoulder: Secondary | ICD-10-CM | POA: Diagnosis not present

## 2020-07-27 DIAGNOSIS — I10 Essential (primary) hypertension: Secondary | ICD-10-CM | POA: Diagnosis not present

## 2020-07-28 ENCOUNTER — Telehealth: Payer: Self-pay | Admitting: *Deleted

## 2020-07-28 NOTE — Telephone Encounter (Signed)
Verbal orders requested for home health nurse to go out to the pt twice a week for 2 weeks then once a week for 2 weeks.

## 2020-07-28 NOTE — Telephone Encounter (Signed)
Thank you :)

## 2020-08-03 DIAGNOSIS — M25552 Pain in left hip: Secondary | ICD-10-CM | POA: Diagnosis not present

## 2020-08-03 DIAGNOSIS — M48061 Spinal stenosis, lumbar region without neurogenic claudication: Secondary | ICD-10-CM | POA: Diagnosis not present

## 2020-08-03 DIAGNOSIS — M19012 Primary osteoarthritis, left shoulder: Secondary | ICD-10-CM | POA: Diagnosis not present

## 2020-08-03 DIAGNOSIS — M5442 Lumbago with sciatica, left side: Secondary | ICD-10-CM | POA: Diagnosis not present

## 2020-08-03 DIAGNOSIS — G8929 Other chronic pain: Secondary | ICD-10-CM | POA: Diagnosis not present

## 2020-08-03 DIAGNOSIS — I10 Essential (primary) hypertension: Secondary | ICD-10-CM | POA: Diagnosis not present

## 2020-08-06 DIAGNOSIS — M25552 Pain in left hip: Secondary | ICD-10-CM | POA: Diagnosis not present

## 2020-08-06 DIAGNOSIS — M48061 Spinal stenosis, lumbar region without neurogenic claudication: Secondary | ICD-10-CM | POA: Diagnosis not present

## 2020-08-06 DIAGNOSIS — M19012 Primary osteoarthritis, left shoulder: Secondary | ICD-10-CM | POA: Diagnosis not present

## 2020-08-06 DIAGNOSIS — I10 Essential (primary) hypertension: Secondary | ICD-10-CM | POA: Diagnosis not present

## 2020-08-06 DIAGNOSIS — M5442 Lumbago with sciatica, left side: Secondary | ICD-10-CM | POA: Diagnosis not present

## 2020-08-06 DIAGNOSIS — G8929 Other chronic pain: Secondary | ICD-10-CM | POA: Diagnosis not present

## 2020-08-10 DIAGNOSIS — M5136 Other intervertebral disc degeneration, lumbar region: Secondary | ICD-10-CM | POA: Diagnosis not present

## 2020-08-10 DIAGNOSIS — M4726 Other spondylosis with radiculopathy, lumbar region: Secondary | ICD-10-CM | POA: Diagnosis not present

## 2020-08-10 DIAGNOSIS — M48062 Spinal stenosis, lumbar region with neurogenic claudication: Secondary | ICD-10-CM | POA: Diagnosis not present

## 2020-08-10 DIAGNOSIS — M48061 Spinal stenosis, lumbar region without neurogenic claudication: Secondary | ICD-10-CM | POA: Diagnosis not present

## 2020-08-11 DIAGNOSIS — M5442 Lumbago with sciatica, left side: Secondary | ICD-10-CM | POA: Diagnosis not present

## 2020-08-11 DIAGNOSIS — M19012 Primary osteoarthritis, left shoulder: Secondary | ICD-10-CM | POA: Diagnosis not present

## 2020-08-11 DIAGNOSIS — I10 Essential (primary) hypertension: Secondary | ICD-10-CM | POA: Diagnosis not present

## 2020-08-11 DIAGNOSIS — M48061 Spinal stenosis, lumbar region without neurogenic claudication: Secondary | ICD-10-CM | POA: Diagnosis not present

## 2020-08-11 DIAGNOSIS — M25552 Pain in left hip: Secondary | ICD-10-CM | POA: Diagnosis not present

## 2020-08-11 DIAGNOSIS — G8929 Other chronic pain: Secondary | ICD-10-CM | POA: Diagnosis not present

## 2020-08-15 ENCOUNTER — Other Ambulatory Visit: Payer: Self-pay | Admitting: Osteopathic Medicine

## 2020-08-24 DIAGNOSIS — R1110 Vomiting, unspecified: Secondary | ICD-10-CM | POA: Diagnosis not present

## 2020-08-24 DIAGNOSIS — Z20822 Contact with and (suspected) exposure to covid-19: Secondary | ICD-10-CM | POA: Diagnosis not present

## 2020-08-24 DIAGNOSIS — R197 Diarrhea, unspecified: Secondary | ICD-10-CM | POA: Diagnosis not present

## 2020-08-25 DIAGNOSIS — M5442 Lumbago with sciatica, left side: Secondary | ICD-10-CM | POA: Diagnosis not present

## 2020-08-25 DIAGNOSIS — I130 Hypertensive heart and chronic kidney disease with heart failure and stage 1 through stage 4 chronic kidney disease, or unspecified chronic kidney disease: Secondary | ICD-10-CM | POA: Diagnosis not present

## 2020-08-25 DIAGNOSIS — Z8673 Personal history of transient ischemic attack (TIA), and cerebral infarction without residual deficits: Secondary | ICD-10-CM | POA: Diagnosis not present

## 2020-08-25 DIAGNOSIS — I5032 Chronic diastolic (congestive) heart failure: Secondary | ICD-10-CM | POA: Diagnosis not present

## 2020-08-25 DIAGNOSIS — F419 Anxiety disorder, unspecified: Secondary | ICD-10-CM | POA: Diagnosis not present

## 2020-08-25 DIAGNOSIS — F32A Depression, unspecified: Secondary | ICD-10-CM | POA: Diagnosis not present

## 2020-08-25 DIAGNOSIS — R197 Diarrhea, unspecified: Secondary | ICD-10-CM | POA: Diagnosis not present

## 2020-08-25 DIAGNOSIS — M19012 Primary osteoarthritis, left shoulder: Secondary | ICD-10-CM | POA: Diagnosis not present

## 2020-08-25 DIAGNOSIS — Z8616 Personal history of COVID-19: Secondary | ICD-10-CM | POA: Diagnosis not present

## 2020-08-25 DIAGNOSIS — R531 Weakness: Secondary | ICD-10-CM | POA: Diagnosis not present

## 2020-08-25 DIAGNOSIS — F0390 Unspecified dementia without behavioral disturbance: Secondary | ICD-10-CM | POA: Diagnosis not present

## 2020-08-25 DIAGNOSIS — N1832 Chronic kidney disease, stage 3b: Secondary | ICD-10-CM | POA: Diagnosis not present

## 2020-08-25 DIAGNOSIS — G8929 Other chronic pain: Secondary | ICD-10-CM | POA: Diagnosis not present

## 2020-08-25 DIAGNOSIS — I480 Paroxysmal atrial fibrillation: Secondary | ICD-10-CM | POA: Diagnosis not present

## 2020-08-25 DIAGNOSIS — N390 Urinary tract infection, site not specified: Secondary | ICD-10-CM | POA: Diagnosis not present

## 2020-08-25 DIAGNOSIS — M48061 Spinal stenosis, lumbar region without neurogenic claudication: Secondary | ICD-10-CM | POA: Diagnosis not present

## 2020-09-02 ENCOUNTER — Telehealth (INDEPENDENT_AMBULATORY_CARE_PROVIDER_SITE_OTHER): Payer: Medicare Other | Admitting: Osteopathic Medicine

## 2020-09-02 VITALS — BP 172/82 | HR 63

## 2020-09-02 DIAGNOSIS — R0902 Hypoxemia: Secondary | ICD-10-CM | POA: Diagnosis not present

## 2020-09-02 DIAGNOSIS — J984 Other disorders of lung: Secondary | ICD-10-CM | POA: Diagnosis not present

## 2020-09-02 DIAGNOSIS — I10 Essential (primary) hypertension: Secondary | ICD-10-CM | POA: Diagnosis not present

## 2020-09-02 DIAGNOSIS — Z20822 Contact with and (suspected) exposure to covid-19: Secondary | ICD-10-CM | POA: Diagnosis not present

## 2020-09-02 DIAGNOSIS — R935 Abnormal findings on diagnostic imaging of other abdominal regions, including retroperitoneum: Secondary | ICD-10-CM | POA: Diagnosis not present

## 2020-09-02 DIAGNOSIS — N390 Urinary tract infection, site not specified: Secondary | ICD-10-CM | POA: Diagnosis not present

## 2020-09-02 DIAGNOSIS — R197 Diarrhea, unspecified: Secondary | ICD-10-CM | POA: Diagnosis not present

## 2020-09-02 DIAGNOSIS — Z96611 Presence of right artificial shoulder joint: Secondary | ICD-10-CM | POA: Diagnosis not present

## 2020-09-02 DIAGNOSIS — N1832 Chronic kidney disease, stage 3b: Secondary | ICD-10-CM | POA: Diagnosis not present

## 2020-09-02 DIAGNOSIS — R112 Nausea with vomiting, unspecified: Secondary | ICD-10-CM | POA: Diagnosis not present

## 2020-09-02 DIAGNOSIS — R0603 Acute respiratory distress: Secondary | ICD-10-CM

## 2020-09-02 DIAGNOSIS — U071 COVID-19: Secondary | ICD-10-CM | POA: Diagnosis not present

## 2020-09-02 DIAGNOSIS — Z9049 Acquired absence of other specified parts of digestive tract: Secondary | ICD-10-CM | POA: Diagnosis not present

## 2020-09-02 DIAGNOSIS — R110 Nausea: Secondary | ICD-10-CM | POA: Diagnosis not present

## 2020-09-02 DIAGNOSIS — A0839 Other viral enteritis: Secondary | ICD-10-CM | POA: Diagnosis not present

## 2020-09-02 DIAGNOSIS — F0390 Unspecified dementia without behavioral disturbance: Secondary | ICD-10-CM | POA: Diagnosis not present

## 2020-09-02 DIAGNOSIS — N3000 Acute cystitis without hematuria: Secondary | ICD-10-CM | POA: Diagnosis not present

## 2020-09-02 DIAGNOSIS — K5792 Diverticulitis of intestine, part unspecified, without perforation or abscess without bleeding: Secondary | ICD-10-CM | POA: Diagnosis not present

## 2020-09-02 DIAGNOSIS — K5732 Diverticulitis of large intestine without perforation or abscess without bleeding: Secondary | ICD-10-CM | POA: Diagnosis not present

## 2020-09-02 DIAGNOSIS — I130 Hypertensive heart and chronic kidney disease with heart failure and stage 1 through stage 4 chronic kidney disease, or unspecified chronic kidney disease: Secondary | ICD-10-CM | POA: Diagnosis not present

## 2020-09-02 DIAGNOSIS — R1111 Vomiting without nausea: Secondary | ICD-10-CM | POA: Diagnosis not present

## 2020-09-02 DIAGNOSIS — U099 Post covid-19 condition, unspecified: Secondary | ICD-10-CM | POA: Diagnosis not present

## 2020-09-02 DIAGNOSIS — R079 Chest pain, unspecified: Secondary | ICD-10-CM | POA: Diagnosis not present

## 2020-09-02 DIAGNOSIS — I5032 Chronic diastolic (congestive) heart failure: Secondary | ICD-10-CM | POA: Diagnosis not present

## 2020-09-02 DIAGNOSIS — R0602 Shortness of breath: Secondary | ICD-10-CM | POA: Diagnosis not present

## 2020-09-02 NOTE — Progress Notes (Signed)
CMA spoke to patient's daughter for intake for virtual visit Daughter reports SpO2 was in 38s and patient SOB Patient's son in law lives in same house, recent hospitalization for COVID. CMA advised transfer to ER given SOB and hypoxia on home measurements - I agreed. Pt's daughter gave verbal confirmation that she was taking patient to hospital now.   The primary encounter diagnosis was Hypoxia. Diagnoses of Acute respiratory distress and Contact with and (suspected) exposure to covid-19 were also pertinent to this visit.  --> ER  5 mins spent non face-to-face

## 2020-09-03 DIAGNOSIS — U071 COVID-19: Secondary | ICD-10-CM | POA: Diagnosis not present

## 2020-09-04 DIAGNOSIS — Z7902 Long term (current) use of antithrombotics/antiplatelets: Secondary | ICD-10-CM | POA: Diagnosis not present

## 2020-09-04 DIAGNOSIS — N1832 Chronic kidney disease, stage 3b: Secondary | ICD-10-CM | POA: Diagnosis present

## 2020-09-04 DIAGNOSIS — Z8673 Personal history of transient ischemic attack (TIA), and cerebral infarction without residual deficits: Secondary | ICD-10-CM | POA: Diagnosis not present

## 2020-09-04 DIAGNOSIS — I5032 Chronic diastolic (congestive) heart failure: Secondary | ICD-10-CM | POA: Diagnosis present

## 2020-09-04 DIAGNOSIS — Z90710 Acquired absence of both cervix and uterus: Secondary | ICD-10-CM | POA: Diagnosis not present

## 2020-09-04 DIAGNOSIS — N390 Urinary tract infection, site not specified: Secondary | ICD-10-CM | POA: Diagnosis present

## 2020-09-04 DIAGNOSIS — D649 Anemia, unspecified: Secondary | ICD-10-CM | POA: Diagnosis present

## 2020-09-04 DIAGNOSIS — I10 Essential (primary) hypertension: Secondary | ICD-10-CM | POA: Diagnosis not present

## 2020-09-04 DIAGNOSIS — F329 Major depressive disorder, single episode, unspecified: Secondary | ICD-10-CM | POA: Diagnosis present

## 2020-09-04 DIAGNOSIS — K219 Gastro-esophageal reflux disease without esophagitis: Secondary | ICD-10-CM | POA: Diagnosis present

## 2020-09-04 DIAGNOSIS — F0390 Unspecified dementia without behavioral disturbance: Secondary | ICD-10-CM | POA: Diagnosis present

## 2020-09-04 DIAGNOSIS — Z8543 Personal history of malignant neoplasm of ovary: Secondary | ICD-10-CM | POA: Diagnosis not present

## 2020-09-04 DIAGNOSIS — R0602 Shortness of breath: Secondary | ICD-10-CM | POA: Diagnosis not present

## 2020-09-04 DIAGNOSIS — F419 Anxiety disorder, unspecified: Secondary | ICD-10-CM | POA: Diagnosis present

## 2020-09-04 DIAGNOSIS — Z91040 Latex allergy status: Secondary | ICD-10-CM | POA: Diagnosis not present

## 2020-09-04 DIAGNOSIS — Z888 Allergy status to other drugs, medicaments and biological substances status: Secondary | ICD-10-CM | POA: Diagnosis not present

## 2020-09-04 DIAGNOSIS — R197 Diarrhea, unspecified: Secondary | ICD-10-CM | POA: Diagnosis not present

## 2020-09-04 DIAGNOSIS — I480 Paroxysmal atrial fibrillation: Secondary | ICD-10-CM | POA: Diagnosis present

## 2020-09-04 DIAGNOSIS — A0839 Other viral enteritis: Secondary | ICD-10-CM | POA: Diagnosis present

## 2020-09-04 DIAGNOSIS — U071 COVID-19: Secondary | ICD-10-CM | POA: Diagnosis not present

## 2020-09-04 DIAGNOSIS — I130 Hypertensive heart and chronic kidney disease with heart failure and stage 1 through stage 4 chronic kidney disease, or unspecified chronic kidney disease: Secondary | ICD-10-CM | POA: Diagnosis present

## 2020-09-04 DIAGNOSIS — Z880 Allergy status to penicillin: Secondary | ICD-10-CM | POA: Diagnosis not present

## 2020-09-04 DIAGNOSIS — Z881 Allergy status to other antibiotic agents status: Secondary | ICD-10-CM | POA: Diagnosis not present

## 2020-09-04 DIAGNOSIS — E785 Hyperlipidemia, unspecified: Secondary | ICD-10-CM | POA: Diagnosis present

## 2020-09-04 DIAGNOSIS — K5792 Diverticulitis of intestine, part unspecified, without perforation or abscess without bleeding: Secondary | ICD-10-CM | POA: Diagnosis present

## 2020-09-04 DIAGNOSIS — J449 Chronic obstructive pulmonary disease, unspecified: Secondary | ICD-10-CM | POA: Diagnosis present

## 2020-09-05 DIAGNOSIS — U071 COVID-19: Secondary | ICD-10-CM | POA: Diagnosis not present

## 2020-09-06 DIAGNOSIS — I480 Paroxysmal atrial fibrillation: Secondary | ICD-10-CM | POA: Diagnosis not present

## 2020-09-06 DIAGNOSIS — N1832 Chronic kidney disease, stage 3b: Secondary | ICD-10-CM | POA: Diagnosis not present

## 2020-09-06 DIAGNOSIS — I10 Essential (primary) hypertension: Secondary | ICD-10-CM | POA: Diagnosis not present

## 2020-09-06 DIAGNOSIS — I5032 Chronic diastolic (congestive) heart failure: Secondary | ICD-10-CM | POA: Diagnosis not present

## 2020-09-06 DIAGNOSIS — R197 Diarrhea, unspecified: Secondary | ICD-10-CM | POA: Diagnosis not present

## 2020-09-06 DIAGNOSIS — U071 COVID-19: Secondary | ICD-10-CM | POA: Diagnosis not present

## 2020-09-06 DIAGNOSIS — F0390 Unspecified dementia without behavioral disturbance: Secondary | ICD-10-CM | POA: Diagnosis not present

## 2020-09-07 ENCOUNTER — Telehealth: Payer: Self-pay

## 2020-09-07 DIAGNOSIS — I5032 Chronic diastolic (congestive) heart failure: Secondary | ICD-10-CM | POA: Diagnosis not present

## 2020-09-07 DIAGNOSIS — N1832 Chronic kidney disease, stage 3b: Secondary | ICD-10-CM | POA: Diagnosis not present

## 2020-09-07 DIAGNOSIS — F0390 Unspecified dementia without behavioral disturbance: Secondary | ICD-10-CM | POA: Diagnosis not present

## 2020-09-07 DIAGNOSIS — I480 Paroxysmal atrial fibrillation: Secondary | ICD-10-CM | POA: Diagnosis not present

## 2020-09-07 DIAGNOSIS — R197 Diarrhea, unspecified: Secondary | ICD-10-CM | POA: Diagnosis not present

## 2020-09-07 DIAGNOSIS — U071 COVID-19: Secondary | ICD-10-CM | POA: Diagnosis not present

## 2020-09-07 DIAGNOSIS — I10 Essential (primary) hypertension: Secondary | ICD-10-CM | POA: Diagnosis not present

## 2020-09-07 NOTE — Telephone Encounter (Signed)
Tanya with [facility] called to report that patient is being discharged home today from [facility] after Christmas.  They want to restart PT and ADD nursing and OT services.

## 2020-09-07 NOTE — Telephone Encounter (Signed)
OK to give verbal orders, can we schedule patient for HFU (ok to do it virtually/phone) sometime in next 1-2 weeks?

## 2020-09-08 NOTE — Telephone Encounter (Signed)
Home health advised.  ?

## 2020-09-09 DIAGNOSIS — R531 Weakness: Secondary | ICD-10-CM | POA: Diagnosis not present

## 2020-09-09 DIAGNOSIS — M19012 Primary osteoarthritis, left shoulder: Secondary | ICD-10-CM | POA: Diagnosis not present

## 2020-09-09 DIAGNOSIS — N390 Urinary tract infection, site not specified: Secondary | ICD-10-CM | POA: Diagnosis not present

## 2020-09-09 DIAGNOSIS — F0390 Unspecified dementia without behavioral disturbance: Secondary | ICD-10-CM | POA: Diagnosis not present

## 2020-09-09 DIAGNOSIS — Z8616 Personal history of COVID-19: Secondary | ICD-10-CM | POA: Diagnosis not present

## 2020-09-09 DIAGNOSIS — M48061 Spinal stenosis, lumbar region without neurogenic claudication: Secondary | ICD-10-CM | POA: Diagnosis not present

## 2020-09-10 DIAGNOSIS — N390 Urinary tract infection, site not specified: Secondary | ICD-10-CM | POA: Diagnosis not present

## 2020-09-10 DIAGNOSIS — M19012 Primary osteoarthritis, left shoulder: Secondary | ICD-10-CM | POA: Diagnosis not present

## 2020-09-10 DIAGNOSIS — F0390 Unspecified dementia without behavioral disturbance: Secondary | ICD-10-CM | POA: Diagnosis not present

## 2020-09-10 DIAGNOSIS — Z8616 Personal history of COVID-19: Secondary | ICD-10-CM | POA: Diagnosis not present

## 2020-09-10 DIAGNOSIS — M48061 Spinal stenosis, lumbar region without neurogenic claudication: Secondary | ICD-10-CM | POA: Diagnosis not present

## 2020-09-10 DIAGNOSIS — R531 Weakness: Secondary | ICD-10-CM | POA: Diagnosis not present

## 2020-09-11 ENCOUNTER — Telehealth: Payer: Self-pay

## 2020-09-11 NOTE — Telephone Encounter (Signed)
[facility] called and states Bethany Park has had diarrhea and vomiting for 3 days. I called patient's daughter and she states the has not diarrhea or vomiting today. I advised if she continued to have diarrhea and vomiting to take her to the ED. She agreed and is going to keep her on a BRAT diet.

## 2020-09-12 DIAGNOSIS — Z8543 Personal history of malignant neoplasm of ovary: Secondary | ICD-10-CM | POA: Diagnosis not present

## 2020-09-12 DIAGNOSIS — R109 Unspecified abdominal pain: Secondary | ICD-10-CM | POA: Diagnosis not present

## 2020-09-12 DIAGNOSIS — R7989 Other specified abnormal findings of blood chemistry: Secondary | ICD-10-CM | POA: Diagnosis not present

## 2020-09-12 DIAGNOSIS — F0390 Unspecified dementia without behavioral disturbance: Secondary | ICD-10-CM | POA: Diagnosis not present

## 2020-09-12 DIAGNOSIS — M19012 Primary osteoarthritis, left shoulder: Secondary | ICD-10-CM | POA: Diagnosis not present

## 2020-09-12 DIAGNOSIS — E785 Hyperlipidemia, unspecified: Secondary | ICD-10-CM | POA: Diagnosis not present

## 2020-09-12 DIAGNOSIS — M48061 Spinal stenosis, lumbar region without neurogenic claudication: Secondary | ICD-10-CM | POA: Diagnosis not present

## 2020-09-12 DIAGNOSIS — Z881 Allergy status to other antibiotic agents status: Secondary | ICD-10-CM | POA: Diagnosis not present

## 2020-09-12 DIAGNOSIS — K219 Gastro-esophageal reflux disease without esophagitis: Secondary | ICD-10-CM | POA: Diagnosis not present

## 2020-09-12 DIAGNOSIS — R112 Nausea with vomiting, unspecified: Secondary | ICD-10-CM | POA: Diagnosis not present

## 2020-09-12 DIAGNOSIS — Z7902 Long term (current) use of antithrombotics/antiplatelets: Secondary | ICD-10-CM | POA: Diagnosis not present

## 2020-09-12 DIAGNOSIS — I10 Essential (primary) hypertension: Secondary | ICD-10-CM | POA: Diagnosis not present

## 2020-09-12 DIAGNOSIS — R531 Weakness: Secondary | ICD-10-CM | POA: Diagnosis not present

## 2020-09-12 DIAGNOSIS — D649 Anemia, unspecified: Secondary | ICD-10-CM | POA: Diagnosis not present

## 2020-09-12 DIAGNOSIS — Z8616 Personal history of COVID-19: Secondary | ICD-10-CM | POA: Diagnosis not present

## 2020-09-12 DIAGNOSIS — Z91040 Latex allergy status: Secondary | ICD-10-CM | POA: Diagnosis not present

## 2020-09-12 DIAGNOSIS — Z79899 Other long term (current) drug therapy: Secondary | ICD-10-CM | POA: Diagnosis not present

## 2020-09-12 DIAGNOSIS — Z888 Allergy status to other drugs, medicaments and biological substances status: Secondary | ICD-10-CM | POA: Diagnosis not present

## 2020-09-12 DIAGNOSIS — Z880 Allergy status to penicillin: Secondary | ICD-10-CM | POA: Diagnosis not present

## 2020-09-12 DIAGNOSIS — N390 Urinary tract infection, site not specified: Secondary | ICD-10-CM | POA: Diagnosis not present

## 2020-09-12 DIAGNOSIS — R197 Diarrhea, unspecified: Secondary | ICD-10-CM | POA: Diagnosis not present

## 2020-09-14 ENCOUNTER — Telehealth: Payer: Self-pay

## 2020-09-14 ENCOUNTER — Encounter: Payer: Self-pay | Admitting: Osteopathic Medicine

## 2020-09-14 ENCOUNTER — Telehealth (INDEPENDENT_AMBULATORY_CARE_PROVIDER_SITE_OTHER): Payer: Medicare Other | Admitting: Osteopathic Medicine

## 2020-09-14 VITALS — BP 119/66 | HR 84 | Wt 124.0 lb

## 2020-09-14 DIAGNOSIS — Z8616 Personal history of COVID-19: Secondary | ICD-10-CM | POA: Diagnosis not present

## 2020-09-14 DIAGNOSIS — K591 Functional diarrhea: Secondary | ICD-10-CM

## 2020-09-14 MED ORDER — DIPHENOXYLATE-ATROPINE 2.5-0.025 MG PO TABS: 1.0000 | ORAL_TABLET | Freq: Four times a day (QID) | ORAL | 3 refills | Status: AC | PRN

## 2020-09-14 MED ORDER — ONDANSETRON 8 MG PO TBDP: 8.0000 mg | ORAL_TABLET | Freq: Three times a day (TID) | ORAL | 3 refills | Status: AC | PRN

## 2020-09-14 NOTE — Telephone Encounter (Signed)
Verbal orders left on occupational therapist's VM.

## 2020-09-14 NOTE — Patient Instructions (Addendum)
Low FODMAP diet advised  Hydration encouraged. Symptomatic control for now but if still not resolved in the next 3 to 5 days would strongly consider GI referral.  Daughter will call us with update.

## 2020-09-14 NOTE — Telephone Encounter (Signed)
They have completed their OT evaluation and would like to see the patient one time per week for 3 weeks.  OK to give Verbal Order?

## 2020-09-14 NOTE — Progress Notes (Signed)
Virtual Visit via tELEPHONE  I connected with      Bethany Park on 09/14/20 at 10:58 AM  by a telemedicine application and verified that I am speaking with the correct person using two identifiers.  Patient is at home  I am in office   I discussed the limitations of evaluation and management by telemedicine and the availability of in person appointments. The patient expressed understanding and agreed to proceed.  History of Present Illness: Bethany Park is a 86 y.o. female who would like to discuss hospital follow-up.  Daughter provides majority of history.  Patient recently admitted to the hospital with significant hypoxia due to COVID-19.  Is recovering overall well, home health is in place.  Only complaint since discharge is persistent diarrhea.  This has been ongoing for about 6 weeks total, was present prior to hospitalization.  Records reviewed, C. difficile testing was negative.           Observations/Objective: BP 119/66   Pulse 84   Wt 124 lb (56.2 kg)   BMI 20.66 kg/m  BP Readings from Last 3 Encounters:  09/14/20 119/66  09/02/20 (!) 172/82  05/19/20 (!) 215/100   Exam: Normal Speech.  NAD  Lab and Radiology Results No results found for this or any previous visit (from the past 72 hour(s)). No results found.     Assessment and Plan: 86 y.o. female with The primary encounter diagnosis was Functional diarrhea. A diagnosis of History of COVID-19 was also pertinent to this visit.   PDMP not reviewed this encounter. No orders of the defined types were placed in this encounter.  Meds ordered this encounter  Medications  . diphenoxylate-atropine (LOMOTIL) 2.5-0.025 MG tablet    Sig: Take 1 tablet by mouth 4 (four) times daily as needed for diarrhea or loose stools.    Dispense:  30 tablet    Refill:  3  . ondansetron (ZOFRAN-ODT) 8 MG disintegrating tablet    Sig: Take 1 tablet (8 mg total) by mouth every 8 (eight) hours as needed for nausea.     Dispense:  20 tablet    Refill:  3  . AMBULATORY NON FORMULARY MEDICATION    Sig: Medication Name: handicap lift for shower/bathtub Fax to [facility] in [place]:  1 Units    Refill:  prn   Patient Instructions  Low FODMAP diet advised  Hydration encouraged. Symptomatic control for now but if still not resolved in the next 3 to 5 days would strongly consider GI referral.  Daughter will call us with update.   Follow Up Instructions: Return if symptoms worsen or fail to improve.    I discussed the assessment and treatment plan with the patient. The patient was provided an opportunity to ask questions and all were answered. The patient agreed with the plan and demonstrated an understanding of the instructions.   The patient was advised to call back or seek an in-person evaluation if any new concerns, if symptoms worsen or if the condition fails to improve as anticipated.  30 minutes of non-face-to-face time was provided during this encounter.      . . . . . . . . . . . . . Marland Kitchen                   Historical information moved to improve visibility of documentation.  Past Medical History:  Diagnosis Date  . Anemia   . Aortic stenosis   .  Atrial fibrillation ([place])   . B12 deficiency 03/13/2018  . Colon polyp 09/2013   in [place] state.  for personal hx polyps and fm hx colon ca.  found small transverse polyp, diverticulosis, int hemorrhoids.    . Dementia ([place])   . Depression with anxiety 03/13/2018  . Essential hypertension 03/13/2018  . Glucose intolerance 03/13/2018  . Heart attack ([place])   . Heart failure ([place])   . History of COPD   . History of coronary artery stent placement 09/2017  . Moderate aortic stenosis 03/13/2018  . Osteoporosis 03/13/2018  . Ovarian cancer ([place])   . Restless leg syndrome 03/13/2018  . Stroke [facility])    Past Surgical History:  Procedure Laterality Date  . ABDOMINAL HYSTERECTOMY    . BACK SURGERY     . BREAST SURGERY    . CHOLECYSTECTOMY    . CORONARY STENT PLACEMENT    . ESOPHAGOGASTRODUODENOSCOPY N/A 04/16/2018   Procedure: ESOPHAGOGASTRODUODENOSCOPY (EGD);  Surgeon: Ladene Artist, MD;  Location[facility] ENDOSCOPY;  Service: Endoscopy;  Laterality: N/A;  . EYE SURGERY    . HEMORROIDECTOMY    . HOT HEMOSTASIS N/A 04/16/2018   Procedure: HOT HEMOSTASIS (ARGON PLASMA COAGULATION/BICAP);  Surgeon: Ladene Artist, MD;  Location: [facility] ENDOSCOPY;  Service: Endoscopy;  Laterality: N/A;  . JOINT REPLACEMENT     Social History   Tobacco Use  . Smoking status: Never Smoker  . Smokeless tobacco: Never Used  Substance Use Topics  . Alcohol use: Not Currently   family history includes Colon cancer in her father; Diabetes Mellitus II in her mother; Heart disease in her mother; Stroke in her mother.  Medications: Current Outpatient Medications  Medication Sig Dispense Refill  . albuterol (PROVENTIL HFA;VENTOLIN HFA) 108 (90 Base) MCG/ACT inhaler Inhale 2 puffs into the lungs every 6 (six) hours as needed for wheezing. 2 Inhaler 11  . [facility] bed with rails 1 Units prn  . AMBULATORY NON FORMULARY MEDICATION Wheelchair 1 Units prn  . AMBULATORY NON FORMULARY MEDICATION Physical therapy eval and treat for dx: ambulatory dysfunction, charcot marie tooth syndrome, frequent falls. Fax attnManson Passey [phone] 1 Units 99  . amitriptyline (ELAVIL) 25 MG tablet Take 1 tablet (25 mg total) by mouth at bedtime. 90 tablet 0  . atorvastatin (LIPITOR) 40 MG tablet Take 1 tablet (40 mg total) by mouth daily. 90 tablet 3  . carvedilol (COREG) 3.125 MG tablet Take 1 tablet (3.125 mg total) by mouth 2 (two) times daily with a meal. 180 tablet 3  . clopidogrel (PLAVIX) 75 MG tablet Take 1 tablet (75 mg total) by mouth daily. Please schedule annual appt with Dr.Crenshaw for refills. [phone]. 1st attempt. 30 tablet 0  . famotidine (PEPCID) 40 MG tablet Take 1  tablet (40 mg total) by mouth daily. 90 tablet 3  . gabapentin (NEURONTIN) 300 MG capsule TAKE 3 CAPSULES BY MOUTH AT BEDTIME 270 capsule 0  . lisinopril (ZESTRIL) 2.5 MG tablet Take 1 tablet (2.5 mg total) by mouth daily. 90 tablet 3  . LORazepam (ATIVAN) 0.5 MG tablet TAKE 1/2 TO 1 (ONE-HALF TO ONE) TABLET BY MOUTH AT BEDTIME AS NEEDED FOR ANXIETY OR SLEEP 90 tablet 0  . Melatonin 10 MG SUBL Place 10 mg under the tongue at bedtime. 90 tablet 3  . nitroGLYCERIN (NITROSTAT) 0.4 MG SL tablet Place 1 tablet (0.4 mg total) under the tongue every 5 (five) minutes as needed for chest pain. If no better 10-15 mins, go to  hospital 20 tablet 1  . nystatin (MYCOSTATIN/NYSTOP) powder Apply topically 4 (four) times daily. 60 g 1  . oxyCODONE-acetaminophen (PERCOCET) 10-325 MG tablet Take 1 tablet by mouth every 8 (eight) hours as needed for pain. 90 tablet 0  . rOPINIRole (REQUIP) 3 MG tablet Take 1 tablet (3 mg total) by mouth 3 (three) times daily. 270 tablet 3  . umeclidinium-vilanterol (ANORO ELLIPTA) 62.5-25 MCG/INH AEPB Inhale 1 puff into the lungs daily. 60 each 5  . AMBULATORY NON FORMULARY MEDICATION Medication Name: handicap lift for shower/bathtub Fax to [facility] in [place] 1 Units prn  . diphenoxylate-atropine (LOMOTIL) 2.5-0.025 MG tablet Take 1 tablet by mouth 4 (four) times daily as needed for diarrhea or loose stools. 30 tablet 3  . ondansetron (ZOFRAN-ODT) 8 MG disintegrating tablet Take 1 tablet (8 mg total) by mouth every 8 (eight) hours as needed for nausea. 20 tablet 3   No current facility-administered medications for this visit.   Allergies  Allergen Reactions  . Alendronate Sodium Other (See Comments)  . Benztropine Other (See Comments)  . Citalopram Other (See Comments)  . Duloxetine Hcl Other (See Comments)  . Erythromycin Other (See Comments)  . Fluoxetine Hcl Other (See Comments)  . Glimepiride Other (See Comments)  . Nortriptyline Hcl Other (See Comments)   . Latex Other (See Comments)    Per daughter   . Penicillins Other (See Comments)  . Quetiapine Fumarate Other (See Comments)    As per daughter, hallucinations.

## 2020-09-14 NOTE — Telephone Encounter (Signed)
Yes, can go ahead and give verbal order. Thanks!

## 2020-09-15 DIAGNOSIS — Z8616 Personal history of COVID-19: Secondary | ICD-10-CM | POA: Diagnosis not present

## 2020-09-15 DIAGNOSIS — M48061 Spinal stenosis, lumbar region without neurogenic claudication: Secondary | ICD-10-CM | POA: Diagnosis not present

## 2020-09-15 DIAGNOSIS — N390 Urinary tract infection, site not specified: Secondary | ICD-10-CM | POA: Diagnosis not present

## 2020-09-15 DIAGNOSIS — F0390 Unspecified dementia without behavioral disturbance: Secondary | ICD-10-CM | POA: Diagnosis not present

## 2020-09-15 DIAGNOSIS — R531 Weakness: Secondary | ICD-10-CM | POA: Diagnosis not present

## 2020-09-15 DIAGNOSIS — M19012 Primary osteoarthritis, left shoulder: Secondary | ICD-10-CM | POA: Diagnosis not present

## 2020-09-16 MED ORDER — AMBULATORY NON FORMULARY MEDICATION: 99 refills | Status: AC

## 2020-09-17 DIAGNOSIS — M19012 Primary osteoarthritis, left shoulder: Secondary | ICD-10-CM | POA: Diagnosis not present

## 2020-09-17 DIAGNOSIS — M48061 Spinal stenosis, lumbar region without neurogenic claudication: Secondary | ICD-10-CM | POA: Diagnosis not present

## 2020-09-17 DIAGNOSIS — F0390 Unspecified dementia without behavioral disturbance: Secondary | ICD-10-CM | POA: Diagnosis not present

## 2020-09-17 DIAGNOSIS — R531 Weakness: Secondary | ICD-10-CM | POA: Diagnosis not present

## 2020-09-17 DIAGNOSIS — Z8616 Personal history of COVID-19: Secondary | ICD-10-CM | POA: Diagnosis not present

## 2020-09-17 DIAGNOSIS — N390 Urinary tract infection, site not specified: Secondary | ICD-10-CM | POA: Diagnosis not present

## 2020-09-18 DIAGNOSIS — R531 Weakness: Secondary | ICD-10-CM | POA: Diagnosis not present

## 2020-09-18 DIAGNOSIS — N390 Urinary tract infection, site not specified: Secondary | ICD-10-CM | POA: Diagnosis not present

## 2020-09-18 DIAGNOSIS — M19012 Primary osteoarthritis, left shoulder: Secondary | ICD-10-CM | POA: Diagnosis not present

## 2020-09-18 DIAGNOSIS — M48061 Spinal stenosis, lumbar region without neurogenic claudication: Secondary | ICD-10-CM | POA: Diagnosis not present

## 2020-09-18 DIAGNOSIS — F0390 Unspecified dementia without behavioral disturbance: Secondary | ICD-10-CM | POA: Diagnosis not present

## 2020-09-18 DIAGNOSIS — Z8616 Personal history of COVID-19: Secondary | ICD-10-CM | POA: Diagnosis not present

## 2020-09-21 DIAGNOSIS — M19012 Primary osteoarthritis, left shoulder: Secondary | ICD-10-CM | POA: Diagnosis not present

## 2020-09-21 DIAGNOSIS — N390 Urinary tract infection, site not specified: Secondary | ICD-10-CM | POA: Diagnosis not present

## 2020-09-21 DIAGNOSIS — Z8616 Personal history of COVID-19: Secondary | ICD-10-CM | POA: Diagnosis not present

## 2020-09-21 DIAGNOSIS — R531 Weakness: Secondary | ICD-10-CM | POA: Diagnosis not present

## 2020-09-21 DIAGNOSIS — M48061 Spinal stenosis, lumbar region without neurogenic claudication: Secondary | ICD-10-CM | POA: Diagnosis not present

## 2020-09-21 DIAGNOSIS — F0390 Unspecified dementia without behavioral disturbance: Secondary | ICD-10-CM | POA: Diagnosis not present

## 2020-09-22 DIAGNOSIS — M48061 Spinal stenosis, lumbar region without neurogenic claudication: Secondary | ICD-10-CM | POA: Diagnosis not present

## 2020-09-22 DIAGNOSIS — M19012 Primary osteoarthritis, left shoulder: Secondary | ICD-10-CM | POA: Diagnosis not present

## 2020-09-22 DIAGNOSIS — R531 Weakness: Secondary | ICD-10-CM | POA: Diagnosis not present

## 2020-09-22 DIAGNOSIS — F0390 Unspecified dementia without behavioral disturbance: Secondary | ICD-10-CM | POA: Diagnosis not present

## 2020-09-22 DIAGNOSIS — Z8616 Personal history of COVID-19: Secondary | ICD-10-CM | POA: Diagnosis not present

## 2020-09-22 DIAGNOSIS — N390 Urinary tract infection, site not specified: Secondary | ICD-10-CM | POA: Diagnosis not present

## 2020-09-23 ENCOUNTER — Telehealth: Payer: Self-pay

## 2020-09-23 NOTE — Telephone Encounter (Signed)
FYI - Occupational Therapist called to let us know that patient is being discharged today from physical and occupational therapy as she has met her goals.

## 2020-10-01 ENCOUNTER — Ambulatory Visit: Payer: Medicare Other | Admitting: Osteopathic Medicine

## 2020-10-20 ENCOUNTER — Telehealth: Payer: Self-pay | Admitting: General Practice

## 2020-10-20 NOTE — Telephone Encounter (Signed)
Called patient's daughter to schedule the patient for her medicare wellness exam. She stated that her mother has already had the annual exam and did not want to have the medicare wellness exam at this time.

## 2020-10-29 ENCOUNTER — Other Ambulatory Visit: Payer: Self-pay | Admitting: Osteopathic Medicine

## 2020-10-29 ENCOUNTER — Other Ambulatory Visit: Payer: Self-pay

## 2020-10-29 DIAGNOSIS — I10 Essential (primary) hypertension: Secondary | ICD-10-CM

## 2020-10-29 DIAGNOSIS — M48061 Spinal stenosis, lumbar region without neurogenic claudication: Secondary | ICD-10-CM

## 2020-10-29 MED ORDER — CARVEDILOL 3.125 MG PO TABS: 3.1250 mg | ORAL_TABLET | Freq: Two times a day (BID) | ORAL | 1 refills | Status: DC

## 2020-10-29 MED ORDER — ROPINIROLE HCL 3 MG PO TABS: 3.0000 mg | ORAL_TABLET | Freq: Three times a day (TID) | ORAL | 1 refills | Status: DC

## 2020-10-29 MED ORDER — LORAZEPAM 0.5 MG PO TABS: ORAL_TABLET | ORAL | 0 refills | Status: DC

## 2020-10-29 MED ORDER — ATORVASTATIN CALCIUM 40 MG PO TABS
40.0000 mg | ORAL_TABLET | Freq: Every day | ORAL | 1 refills | Status: DC
Start: 2020-10-29 — End: 2021-02-11

## 2020-10-29 MED ORDER — GABAPENTIN 300 MG PO CAPS
900.0000 mg | ORAL_CAPSULE | Freq: Every day | ORAL | 1 refills | Status: DC
Start: 2020-10-29 — End: 2021-02-11

## 2020-11-09 ENCOUNTER — Ambulatory Visit (INDEPENDENT_AMBULATORY_CARE_PROVIDER_SITE_OTHER): Payer: Medicare Other | Admitting: Osteopathic Medicine

## 2020-11-09 ENCOUNTER — Other Ambulatory Visit: Payer: Self-pay

## 2020-11-09 ENCOUNTER — Encounter: Payer: Self-pay | Admitting: Osteopathic Medicine

## 2020-11-09 VITALS — BP 169/93 | HR 87 | Temp 98.1°F | Wt 131.1 lb

## 2020-11-09 DIAGNOSIS — B3731 Acute candidiasis of vulva and vagina: Secondary | ICD-10-CM

## 2020-11-09 DIAGNOSIS — N952 Postmenopausal atrophic vaginitis: Secondary | ICD-10-CM | POA: Diagnosis not present

## 2020-11-09 DIAGNOSIS — L89151 Pressure ulcer of sacral region, stage 1: Secondary | ICD-10-CM

## 2020-11-09 DIAGNOSIS — B373 Candidiasis of vulva and vagina: Secondary | ICD-10-CM | POA: Diagnosis not present

## 2020-11-09 MED ORDER — FLUCONAZOLE 150 MG PO TABS: 150.0000 mg | ORAL_TABLET | Freq: Once | ORAL | 1 refills | Status: AC

## 2020-11-09 MED ORDER — ESTRADIOL 0.1 MG/GM VA CREA: TOPICAL_CREAM | VAGINAL | 12 refills | Status: AC

## 2020-11-09 NOTE — Progress Notes (Signed)
Bethany Park is a 87 y.o. female who presents to  [place] [facility]  today, 11/09/20, seeking care for the following:  . Vaginal skin irritation, was helped w/ OTC medication from daughter at first but now pt c/o "ulcers in my vagina" and skin pain at sacrum. On pelvic exam, no ulceration, there is significant postmenopausal vaginal atrophic change in texture of musoca and pt reports tenderness on exam, there is mild skin redness but no breakdown on vulva. There is mild skin redness at sacrum      ASSESSMENT & PLAN with other pertinent findings:  The primary encounter diagnosis was Post-menopausal atrophic vaginitis. Diagnoses of Vaginal candida and Pressure injury of sacral region, stage 1 were also pertinent to this visit.   No results found for this or any previous visit (from the past 24 hour(s)).  --> treat for vaginal candida, mild sacral pressure sore no ulceration, vaginal atrophy --> FL2 form completed for SNF placement    There are no Patient Instructions on file for this visit.  No orders of the defined types were placed in this encounter.   Meds ordered this encounter  Medications  . fluconazole (DIFLUCAN) 150 MG tablet    Sig: Take 1 tablet (150 mg total) by mouth once for 1 dose. Repeat dose 72 hours if yeast infection / vaginal itching persists    Dispense:  2 tablet    Refill:  1  . estradiol (ESTRACE VAGINAL) 0.1 MG/GM vaginal cream    Sig: Place 1 Applicatorful vaginally at bedtime for 7 days, THEN 1 Applicatorful 3 (three) times a week.    Dispense:  42.5 g    Refill:  12  . AMBULATORY NON FORMULARY MEDICATION    Sig: Medication Name: Mepilex 4 inch x 4 inch or larger sacral foam pad or can substitute similar to place on sacral region and change every 1-2 days    Dispense:  30 Units    Refill:  99       Follow-up instructions: No follow-ups on  file.                                         BP (!) 169/93 (BP Location: Left Arm, Patient Position: Sitting, Cuff Size: Normal)   Pulse 87   Temp 98.1 F (36.7 C) (Oral)   Wt 131 lb 1.3 oz (59.5 kg)   BMI 21.84 kg/m   Current Meds  Medication Sig  . albuterol (PROVENTIL HFA;VENTOLIN HFA) 108 (90 Base) MCG/ACT inhaler Inhale 2 puffs into the lungs every 6 (six) hours as needed for wheezing.  . [facility] bed with rails  . AMBULATORY NON FORMULARY MEDICATION Wheelchair  . AMBULATORY NON FORMULARY MEDICATION Physical therapy eval and treat for dx: ambulatory dysfunction, charcot marie tooth syndrome, frequent falls. Fax attn: Manson Passey [phone]  . AMBULATORY NON FORMULARY MEDICATION Medication Name: handicap lift for shower/bathtub Fax to [facility] in [place]  . AMBULATORY NON FORMULARY MEDICATION Medication Name: Mepilex 4 inch x 4 inch or larger sacral foam pad or can substitute similar to place on sacral region and change every 1-2 days  . amitriptyline (ELAVIL) 25 MG tablet Take 1 tablet (25 mg total) by mouth at bedtime.  Marland Kitchen atorvastatin (LIPITOR) 40 MG tablet Take 1 tablet (40 mg total) by mouth daily.  . carvedilol (COREG)  3.125 MG tablet Take 1 tablet (3.125 mg total) by mouth 2 (two) times daily with a meal.  . clopidogrel (PLAVIX) 75 MG tablet Take 1 tablet (75 mg total) by mouth daily. Please schedule annual appt with Dr.Crenshaw for refills. [phone]. 1st attempt.  . diphenoxylate-atropine (LOMOTIL) 2.5-0.025 MG tablet Take 1 tablet by mouth 4 (four) times daily as needed for diarrhea or loose stools.  Marland Kitchen estradiol (ESTRACE VAGINAL) 0.1 MG/GM vaginal cream Place 1 Applicatorful vaginally at bedtime for 7 days, THEN 1 Applicatorful 3 (three) times a week.  . famotidine (PEPCID) 40 MG tablet Take 1 tablet (40 mg total) by mouth daily.  . [EXPIRED] fluconazole (DIFLUCAN) 150 MG tablet  Take 1 tablet (150 mg total) by mouth once for 1 dose. Repeat dose 72 hours if yeast infection / vaginal itching persists  . gabapentin (NEURONTIN) 300 MG capsule Take 3 capsules (900 mg total) by mouth at bedtime.  Marland Kitchen lisinopril (ZESTRIL) 2.5 MG tablet Take 1 tablet (2.5 mg total) by mouth daily.  Marland Kitchen LORazepam (ATIVAN) 0.5 MG tablet TAKE 1/2 TO 1 (ONE-HALF TO ONE) TABLET BY MOUTH AT BEDTIME AS NEEDED FOR ANXIETY OR SLEEP  . Melatonin 10 MG SUBL Place 10 mg under the tongue at bedtime.  . nitroGLYCERIN (NITROSTAT) 0.4 MG SL tablet Place 1 tablet (0.4 mg total) under the tongue every 5 (five) minutes as needed for chest pain. If no better 10-15 mins, go to hospital  . nystatin (MYCOSTATIN/NYSTOP) powder Apply topically 4 (four) times daily.  . ondansetron (ZOFRAN-ODT) 8 MG disintegrating tablet Take 1 tablet (8 mg total) by mouth every 8 (eight) hours as needed for nausea.  Marland Kitchen oxyCODONE-acetaminophen (PERCOCET) 10-325 MG tablet Take 1 tablet by mouth every 8 (eight) hours as needed for pain.  Marland Kitchen rOPINIRole (REQUIP) 3 MG tablet Take 1 tablet (3 mg total) by mouth 3 (three) times daily.  Marland Kitchen umeclidinium-vilanterol (ANORO ELLIPTA) 62.5-25 MCG/INH AEPB Inhale 1 puff into the lungs daily.    No results found for this or any previous visit (from the past 72 hour(s)).  No results found.     All questions at time of visit were answered - patient instructed to contact office with any additional concerns or updates.  ER/RTC precautions were reviewed with the patient as applicable.   Please note: voice recognition software was used to produce this document, and typos may escape review. Please contact Dr. Sheppard Coil for any needed clarifications.     Total time spent in this in-person encounter was 40 minutes performing my duty and my job as a physician: record review, history-taking and assessment of patient, counseling patient/family and ensuring understanding of plan, coordinating care which may include  but is not limited to further testing or referral. Any modifiers needed may be added by billing department as necessary if I have lapsed in doing so.

## 2020-11-09 NOTE — Progress Notes (Unsigned)
203/86 le

## 2020-11-10 ENCOUNTER — Telehealth: Payer: Self-pay

## 2020-11-10 NOTE — Telephone Encounter (Signed)
[facility] called requesting additional information regarding placement facility for pt. Is provider requesting assistant living and/or skill nursing. As well as any suggestions for placement? Per [facility], rx order can be faxed to [phone].

## 2020-11-11 MED ORDER — AMBULATORY NON FORMULARY MEDICATION: 99 refills | Status: AC

## 2020-11-11 NOTE — Telephone Encounter (Signed)
Attempted to contact [facility]. No answer, left a brief msg requesting a call back to the clinic regarding provider's note. Direct call back info provided.

## 2020-11-11 NOTE — Telephone Encounter (Signed)
Skilled nursing. Family was going to be providing facility details. They need to call daughter.

## 2020-11-20 ENCOUNTER — Telehealth: Payer: Self-pay | Admitting: General Practice

## 2020-11-20 MED ORDER — LISINOPRIL 2.5 MG PO TABS: 2.5000 mg | ORAL_TABLET | Freq: Every day | ORAL | 3 refills | Status: DC

## 2020-11-20 NOTE — Telephone Encounter (Signed)
Refill sent as requested. 

## 2020-12-10 ENCOUNTER — Other Ambulatory Visit: Payer: Self-pay | Admitting: Osteopathic Medicine

## 2020-12-18 NOTE — Telephone Encounter (Signed)
Documentation only.

## 2020-12-24 ENCOUNTER — Telehealth: Payer: Self-pay

## 2020-12-24 DIAGNOSIS — F0390 Unspecified dementia without behavioral disturbance: Secondary | ICD-10-CM

## 2020-12-24 DIAGNOSIS — G600 Hereditary motor and sensory neuropathy: Secondary | ICD-10-CM

## 2020-12-24 NOTE — Telephone Encounter (Signed)
Pt's daughter called requesting a referral for permanent placement in a facility. Per Gae Bon, her husband suffered a massive stroke and she is unable to care for her mother. She mentioned that it was too much to deal with both her mother and her husband's illnesses. She has been unable to tend to her husband's care because she can not leave pt on her own. She feels that this is the best time to have her mother moved to a permanent facility. Pls advise, thanks.

## 2020-12-24 NOTE — Telephone Encounter (Signed)
Please tell her that I am so sorry.  This sounds very stressful and overwhelming.  I did put in a social work consult referral so that they can help her maneuver through the system to get her placed in a nursing facility.  Unfortunately we cannot just send a referral and request admission.  It is usually on the family to find a place in location that has an open bed and then apply for admittance.  But social work can help her maneuver the system and give her some advice.

## 2020-12-25 NOTE — Telephone Encounter (Signed)
Task completed. Pt's daughter has been updated of covering provider's note. Gae Bon will contact the office for any additional inquiries as needed.

## 2021-02-08 ENCOUNTER — Telehealth: Payer: Self-pay

## 2021-02-08 DIAGNOSIS — M48061 Spinal stenosis, lumbar region without neurogenic claudication: Secondary | ICD-10-CM

## 2021-02-08 MED ORDER — OXYCODONE-ACETAMINOPHEN 10-325 MG PO TABS: 1.0000 | ORAL_TABLET | Freq: Three times a day (TID) | ORAL | 0 refills | Status: DC | PRN

## 2021-02-08 NOTE — Telephone Encounter (Signed)
Patients daughter Henrietta Dine) dropped off a piece paper with a note stating that she needs 6 Rx's sent to [facility]. Paperwork is placed in message box- tvt

## 2021-02-08 NOTE — Telephone Encounter (Signed)
Patients daughter Henrietta Dine) dropped off a piece with a note stating that she needs RX's for back and arm send to the [facility] on [address]. Paperwork is placed in message box- tvt

## 2021-02-08 NOTE — Telephone Encounter (Signed)
Done

## 2021-02-09 MED ORDER — OXYCODONE-ACETAMINOPHEN 10-325 MG PO TABS: 1.0000 | ORAL_TABLET | Freq: Three times a day (TID) | ORAL | 0 refills | Status: DC | PRN

## 2021-02-09 NOTE — Telephone Encounter (Signed)
VM full cannot leave message.

## 2021-02-09 NOTE — Telephone Encounter (Signed)
ok, [facility] removed from pharmacy list, new prescription sent in.

## 2021-02-09 NOTE — Telephone Encounter (Signed)
Patient stated that the medications were sent to the [facility] [address] instead of [facility] on [address]. Would you please correct for the patient?

## 2021-02-11 ENCOUNTER — Other Ambulatory Visit: Payer: Self-pay

## 2021-02-11 ENCOUNTER — Telehealth: Payer: Self-pay

## 2021-02-11 DIAGNOSIS — I10 Essential (primary) hypertension: Secondary | ICD-10-CM

## 2021-02-11 MED ORDER — CARVEDILOL 3.125 MG PO TABS: 3.1250 mg | ORAL_TABLET | Freq: Two times a day (BID) | ORAL | 1 refills | Status: AC

## 2021-02-11 MED ORDER — ATORVASTATIN CALCIUM 40 MG PO TABS: 40.0000 mg | ORAL_TABLET | Freq: Every day | ORAL | 1 refills | Status: AC

## 2021-02-11 MED ORDER — ROPINIROLE HCL 3 MG PO TABS: 3.0000 mg | ORAL_TABLET | Freq: Three times a day (TID) | ORAL | 1 refills | Status: AC

## 2021-02-11 MED ORDER — GABAPENTIN 300 MG PO CAPS: 900.0000 mg | ORAL_CAPSULE | Freq: Every day | ORAL | 1 refills | Status: AC

## 2021-02-11 MED ORDER — LORAZEPAM 0.5 MG PO TABS: ORAL_TABLET | ORAL | 0 refills | Status: DC

## 2021-02-11 MED ORDER — LISINOPRIL 2.5 MG PO TABS: 2.5000 mg | ORAL_TABLET | Freq: Every day | ORAL | 3 refills | Status: DC

## 2021-02-11 NOTE — Telephone Encounter (Signed)
Patient's daughter dropped by requesting a med refill for lorazepam and other daily medications. Rfs for daily meds renewed and sent to [facility].Pls send to [facility] as 90 days.

## 2021-02-11 NOTE — Telephone Encounter (Signed)
VM full unable to leave message

## 2021-02-18 NOTE — Telephone Encounter (Signed)
Task completed. All requested medications have been sent to mail order pharmacy. The pharmacy will contact patient for delivery of the rxs.

## 2021-03-10 ENCOUNTER — Other Ambulatory Visit: Payer: Self-pay

## 2021-03-10 ENCOUNTER — Emergency Department (INDEPENDENT_AMBULATORY_CARE_PROVIDER_SITE_OTHER)
Admission: EM | Admit: 2021-03-10 | Discharge: 2021-03-10 | Disposition: A | Discharge status: Home / Self Care | Payer: Medicare Other | Source: Home / Self Care | Attending: Family Medicine | Admitting: Family Medicine

## 2021-03-10 DIAGNOSIS — S0083XA Contusion of other part of head, initial encounter: Secondary | ICD-10-CM

## 2021-03-10 DIAGNOSIS — W19XXXA Unspecified fall, initial encounter: Secondary | ICD-10-CM

## 2021-03-10 DIAGNOSIS — R2681 Unsteadiness on feet: Secondary | ICD-10-CM

## 2021-03-10 DIAGNOSIS — Y92009 Unspecified place in unspecified non-institutional (private) residence as the place of occurrence of the external cause: Secondary | ICD-10-CM

## 2021-03-10 MED ORDER — FLUCONAZOLE 150 MG PO TABS: 150.0000 mg | ORAL_TABLET | Freq: Every day | ORAL | 0 refills | Status: DC

## 2021-03-10 NOTE — Discharge Instructions (Signed)
Return if needed

## 2021-03-10 NOTE — ED Provider Notes (Signed)
[facility]    CSN: [id number] Arrival date & time: 03/10/21  1504      History   Chief Complaint Chief Complaint  Patient presents with  . Fall    HPI [facility] is a 87 y.o. female.   HPI   This is a 87 year old woman who is with her daughter.  According to the daughter she heard a noise and ran into mother's bedroom, finding her on the floor.  This happened the night before last.  Mother states that she lost her balance.  She fell going through a doorway.  She denies that she fainted.  She denies any loss of consciousness.  She denies any head injury.  She denies any nausea or vomiting.  She has a number of taken plain complaints.  These are the same ache and pain complaints that she usually has.  Specifically denies any pain in her neck and back.  Denies any pain in her hips or knees.  Denies any pain in her upper extremities with the exception of loss of range of motion of the left shoulder due to "old rotator cuff injury" Patient is on Plavix.  Daughter said there was some bleeding from a skin tear on her right elbow.  There is also a small bruise on her left forehead near the eyebrow.  Mother's been eating normally.  Behavior seems normal for her.  Mother states she wanted to be evaluated today because she did not sleep well last night  Past Medical History:  Diagnosis Date  . Anemia   . Aortic stenosis   . Atrial fibrillation ([place])   . B12 deficiency 03/13/2018  . Colon polyp 09/2013   in [place] state.  for personal hx polyps and fm hx colon ca.  found small transverse polyp, diverticulosis, int hemorrhoids.    . Dementia ([place])   . Depression with anxiety 03/13/2018  . Essential hypertension 03/13/2018  . Glucose intolerance 03/13/2018  . Heart attack ([place])   . Heart failure ([place])   . History of COPD   . History of coronary artery stent placement 09/2017  . Moderate aortic stenosis 03/13/2018  . Osteoporosis 03/13/2018  . Ovarian cancer ([place])   .  Restless leg syndrome 03/13/2018  . Stroke [facility])     Patient Active Problem List   Diagnosis Date Noted  . Primary osteoarthritis of left shoulder 07/22/2020  . Lumbar spinal stenosis 06/18/2020  . Vertigo 03/17/2020  . Laceration of scalp 12/24/2019  . Metatarsalgia of both feet 12/24/2019  . Subarachnoid hemorrhage ([place]) 11/11/2018  . Laceration of right forearm 05/11/2018  . Sundowning 05/11/2018  . Chronic low back pain 04/26/2018  . Chronic right hip pain 04/26/2018  . Melena   . UGIB (upper gastrointestinal bleed) 04/15/2018  . Anemia, blood loss 04/15/2018  . Depression 04/15/2018  . CAD (coronary artery disease) 04/15/2018  . Fall 04/15/2018  . Hyponatremia 04/15/2018  . Acute renal failure superimposed on stage 3 chronic kidney disease ([place]) 04/15/2018  . UTI (urinary tract infection) 04/15/2018  . Hypotension   . Ambulatory dysfunction 04/03/2018  . Generalized abdominal pain 03/28/2018  . 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  . 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 Diabetic macular edema ([place]) 03/13/2018  . Mild nonproliferative diabetic retinopathy ([place]) 03/13/2018  . Atrial fibrillation ([place]) 03/13/2018  . Essential hypertension 03/13/2018  . History of coronary artery stent placement 03/13/2018  . ASCVD (arteriosclerotic cardiovascular disease) 03/13/2018  . Moderate aortic stenosis 03/13/2018  . Dementia ([place]) 03/13/2018  .  HLD (hyperlipidemia) 03/13/2018  . B12 deficiency 03/13/2018  . Osteoporosis 03/13/2018  . Mild sleep apnea 03/13/2018  . Colon polyp 03/13/2018  . Restless leg syndrome 03/13/2018  . Macular degeneration 03/13/2018  . Anemia 03/13/2018  . Depression with anxiety 03/13/2018  . Overactive bladder 03/13/2018  . History of CVA (cerebrovascular accident) 03/13/2018  . Glucose intolerance 03/13/2018  . Crohn disease ([place]) 03/13/2018    Past Surgical History:  Procedure Laterality Date  . ABDOMINAL HYSTERECTOMY    . BACK SURGERY    .  BREAST SURGERY    . CHOLECYSTECTOMY    . CORONARY STENT PLACEMENT    . ESOPHAGOGASTRODUODENOSCOPY N/A 04/16/2018   Procedure: ESOPHAGOGASTRODUODENOSCOPY (EGD);  Surgeon: Ladene Artist, MD;  Location: [facility] ENDOSCOPY;  Service: Endoscopy;  Laterality: N/A;  . EYE SURGERY    . HEMORROIDECTOMY    . HOT HEMOSTASIS N/A 04/16/2018   Procedure: HOT HEMOSTASIS (ARGON PLASMA COAGULATION/BICAP);  Surgeon: Ladene Artist, MD;  Location: [facility] ENDOSCOPY;  Service: Endoscopy;  Laterality: N/A;  . JOINT REPLACEMENT      OB History   No obstetric history on file.      Home Medications    Prior to Admission medications   Medication Sig Start Date End Date Taking? Authorizing Provider  fluconazole (DIFLUCAN) 150 MG tablet Take 1 tablet (150 mg total) by mouth daily. Repeat in 1 week if needed 03/10/21  Yes Raylene Everts, MD  albuterol (PROVENTIL HFA;VENTOLIN HFA) 108 (90 Base) MCG/ACT inhaler Inhale 2 puffs into the lungs every 6 (six) hours as needed for wheezing. 06/07/18   Emeterio Reeve, DO  AMBULATORY NON [facility] bed with rails 04/26/18   Emeterio Reeve, DO  AMBULATORY NON [facility] MEDICATION Wheelchair 04/26/18   Emeterio Reeve, DO  AMBULATORY NON FORMULARY MEDICATION Physical therapy eval and treat for dx: ambulatory dysfunction, charcot marie tooth syndrome, frequent falls. Fax attnManson Passey [phone] 06/05/19   Emeterio Reeve, DO  AMBULATORY NON FORMULARY MEDICATION Medication Name: handicap lift for shower/bathtub Fax to [facility] in [place] 09/16/20   Emeterio Reeve, DO  AMBULATORY NON FORMULARY MEDICATION Medication Name: Mepilex 4 inch x 4 inch or larger sacral foam pad or can substitute similar to place on sacral region and change every 1-2 days 11/11/20   Emeterio Reeve, DO  amitriptyline (ELAVIL) 25 MG tablet Take 1 tablet (25 mg total) by mouth at bedtime. 04/26/18   Emeterio Reeve, DO  atorvastatin (LIPITOR) 40 MG  tablet Take 1 tablet (40 mg total) by mouth daily. 02/11/21   Emeterio Reeve, DO  carvedilol (COREG) 3.125 MG tablet Take 1 tablet (3.125 mg total) by mouth 2 (two) times daily with a meal. 02/11/21   Emeterio Reeve, DO  clopidogrel (PLAVIX) 75 MG tablet Take 1 tablet (75 mg total) by mouth daily. Please schedule annual appt with Dr.Crenshaw for refills. [phone]. 1st attempt. 02/13/20   Lelon Perla, MD  diphenoxylate-atropine (LOMOTIL) 2.5-0.025 MG tablet Take 1 tablet by mouth 4 (four) times daily as needed for diarrhea or loose stools. 09/14/20   Emeterio Reeve, DO  famotidine (PEPCID) 40 MG tablet Take 1 tablet (40 mg total) by mouth daily. 11/11/19   Emeterio Reeve, DO  gabapentin (NEURONTIN) 300 MG capsule Take 3 capsules (900 mg total) by mouth at bedtime. 02/11/21   Emeterio Reeve, DO  lisinopril (ZESTRIL) 2.5 MG tablet Take 1 tablet (2.5 mg total) by mouth daily. 02/11/21   Emeterio Reeve, DO  LORazepam (ATIVAN) 0.5 MG tablet TAKE  1/2 TO 1 (ONE-HALF TO ONE) TABLET BY MOUTH AT BEDTIME AS NEEDED FOR ANXIETY OR SLEEP 02/11/21   Emeterio Reeve, DO  Melatonin 10 MG SUBL Place 10 mg under the tongue at bedtime. 04/03/18   Emeterio Reeve, DO  nitroGLYCERIN (NITROSTAT) 0.4 MG SL tablet Place 1 tablet (0.4 mg total) under the tongue every 5 (five) minutes as needed for chest pain. If no better 10-15 mins, go to hospital 03/27/18   Emeterio Reeve, DO  nystatin (MYCOSTATIN/NYSTOP) powder Apply topically 4 (four) times daily. 12/24/19   Emeterio Reeve, DO  ondansetron (ZOFRAN-ODT) 8 MG disintegrating tablet Take 1 tablet (8 mg total) by mouth every 8 (eight) hours as needed for nausea. 09/14/20   Emeterio Reeve, DO  oxyCODONE-acetaminophen (PERCOCET) 10-325 MG tablet Take 1 tablet by mouth every 8 (eight) hours as needed for pain. 02/09/21   Silverio Decamp, MD  rOPINIRole (REQUIP) 3 MG tablet Take 1 tablet (3 mg total) by mouth 3 (three) times daily.  02/11/21   Emeterio Reeve, DO  umeclidinium-vilanterol (ANORO ELLIPTA) 62.5-25 MCG/INH AEPB Inhale 1 puff into the lungs daily. 06/07/18   Emeterio Reeve, DO    Family History Family History  Problem Relation Age of Onset  . Heart disease Mother   . Diabetes Mellitus II Mother   . Stroke Mother   . Colon cancer Father     Social History Social History   Tobacco Use  . Smoking status: Never Smoker  . Smokeless tobacco: Never Used  Vaping Use  . Vaping Use: Never used  Substance Use Topics  . Alcohol use: Not Currently  . Drug use: Not Currently     Allergies   Alendronate sodium, Benztropine, Citalopram, Duloxetine hcl, Erythromycin, Fluoxetine hcl, Glimepiride, Nortriptyline hcl, Latex, Penicillins, and Quetiapine fumarate   Review of Systems Review of Systems See HPI  Physical Exam Triage Vital Signs ED Triage Vitals [03/10/21 1522]  Enc Vitals Group     BP (!) 185/75     Pulse Rate 71     Resp 17     Temp 98.7 F (37.1 C)     Temp Source Oral     SpO2 97 %     Weight      Height      Head Circumference      Peak Flow      Pain Score      Pain Loc      Pain Edu?      Excl. in [place]?    No data found.  Updated Vital Signs BP (!) 185/75 (BP Location: Right Arm)   Pulse 71   Temp 98.7 F (37.1 C) (Oral)   Resp 17   SpO2 97%       Physical Exam Constitutional:      General: She is not in acute distress.    Appearance: Normal appearance. She is well-developed and normal weight.     Comments: Frail.  Alert.  Good conversation  HENT:     Head: Normocephalic.      Nose: Nose normal. No congestion.     Mouth/Throat:     Mouth: Mucous membranes are moist.     Comments: Mask is in place Eyes:     Conjunctiva/sclera: Conjunctivae normal.     Pupils: Pupils are equal, round, and reactive to light.  Cardiovascular:     Rate and Rhythm: Normal rate and regular rhythm.     Heart sounds: Normal heart sounds.  Pulmonary:  Effort: Pulmonary  effort is normal. No respiratory distress.     Breath sounds: Normal breath sounds.  Abdominal:     General: There is no distension.     Palpations: Abdomen is soft.  Musculoskeletal:        General: Normal range of motion.     Cervical back: Normal range of motion.  Skin:    General: Skin is warm and dry.     Comments: 2 skin tears on the dorsum of the right forearm near the elbow.  Cleaned and Band-Aids placed  Neurological:     General: No focal deficit present.     Mental Status: She is alert.     Gait: Gait abnormal.     Comments: Small steps.  Stooped posture.  Psychiatric:        Behavior: Behavior normal.      UC Treatments / Results  Labs (all labs ordered are listed, but only abnormal results are displayed) Labs Reviewed - No data to display  EKG   Radiology No results found.  Procedures Procedures (including critical care time)  Medications Ordered in UC Medications - No data to display  Initial Impression / Assessment and Plan / UC Course  I have reviewed the triage vital signs and the nursing notes.  Pertinent labs & imaging results that were available during my care of the patient were reviewed by me and considered in my medical decision making (see chart for details).     No visible or palpable tenderness to skull, C-spine T-spine or L-spine.  No tenderness over the pelvis or greater trochanters.  Good range of motion of the hip ankle and knee bilaterally.  Good range of motion of the elbow and wrist bilaterally with right shoulder having limited mobility.  Bruises are as noted. Refill of Diflucan is provided at daughter's request for recurring yeast infections Final Clinical Impressions(s) / UC Diagnoses   Final diagnoses:  Contusion of face, initial encounter  Fall in home, initial encounter  Gait instability     Discharge Instructions     Return if needed   ED Prescriptions    Medication Sig Dispense Auth. Provider   fluconazole  (DIFLUCAN) 150 MG tablet Take 1 tablet (150 mg total) by mouth daily. Repeat in 1 week if needed 2 tablet Raylene Everts, MD     PDMP not reviewed this encounter.   Raylene Everts, MD 03/10/21 1606

## 2021-03-10 NOTE — ED Triage Notes (Addendum)
Pt here today after fall two nights ago. [place] going to bathroom on LT side. No LOC according to daughter. Hurting in places she normally hurts and takes oxycodone for daily. Also Takes plavix daily.

## 2021-04-14 ENCOUNTER — Encounter (HOSPITAL_BASED_OUTPATIENT_CLINIC_OR_DEPARTMENT_OTHER): Payer: Self-pay

## 2021-04-14 ENCOUNTER — Emergency Department (HOSPITAL_BASED_OUTPATIENT_CLINIC_OR_DEPARTMENT_OTHER)
Admission: EM | Admit: 2021-04-14 | Discharge: 2021-04-14 | Disposition: A | Discharge status: Home / Self Care | Payer: Medicare Other | Attending: Emergency Medicine | Admitting: Emergency Medicine

## 2021-04-14 ENCOUNTER — Emergency Department (HOSPITAL_BASED_OUTPATIENT_CLINIC_OR_DEPARTMENT_OTHER): Payer: Medicare Other

## 2021-04-14 ENCOUNTER — Other Ambulatory Visit: Payer: Self-pay

## 2021-04-14 DIAGNOSIS — N183 Chronic kidney disease, stage 3 unspecified: Secondary | ICD-10-CM | POA: Insufficient documentation

## 2021-04-14 DIAGNOSIS — R110 Nausea: Secondary | ICD-10-CM | POA: Diagnosis not present

## 2021-04-14 DIAGNOSIS — I2510 Atherosclerotic heart disease of native coronary artery without angina pectoris: Secondary | ICD-10-CM | POA: Insufficient documentation

## 2021-04-14 DIAGNOSIS — I130 Hypertensive heart and chronic kidney disease with heart failure and stage 1 through stage 4 chronic kidney disease, or unspecified chronic kidney disease: Secondary | ICD-10-CM | POA: Insufficient documentation

## 2021-04-14 DIAGNOSIS — I509 Heart failure, unspecified: Secondary | ICD-10-CM | POA: Diagnosis not present

## 2021-04-14 DIAGNOSIS — F0390 Unspecified dementia without behavioral disturbance: Secondary | ICD-10-CM | POA: Insufficient documentation

## 2021-04-14 DIAGNOSIS — Z8543 Personal history of malignant neoplasm of ovary: Secondary | ICD-10-CM | POA: Diagnosis not present

## 2021-04-14 DIAGNOSIS — Z79899 Other long term (current) drug therapy: Secondary | ICD-10-CM | POA: Insufficient documentation

## 2021-04-14 DIAGNOSIS — Y92009 Unspecified place in unspecified non-institutional (private) residence as the place of occurrence of the external cause: Secondary | ICD-10-CM | POA: Insufficient documentation

## 2021-04-14 DIAGNOSIS — W19XXXA Unspecified fall, initial encounter: Secondary | ICD-10-CM

## 2021-04-14 DIAGNOSIS — R252 Cramp and spasm: Secondary | ICD-10-CM | POA: Diagnosis present

## 2021-04-14 DIAGNOSIS — N3000 Acute cystitis without hematuria: Secondary | ICD-10-CM | POA: Diagnosis not present

## 2021-04-14 DIAGNOSIS — E113219 Type 2 diabetes mellitus with mild nonproliferative diabetic retinopathy with macular edema, unspecified eye: Secondary | ICD-10-CM | POA: Insufficient documentation

## 2021-04-14 DIAGNOSIS — W06XXXA Fall from bed, initial encounter: Secondary | ICD-10-CM | POA: Insufficient documentation

## 2021-04-14 DIAGNOSIS — Z91040 Latex allergy status: Secondary | ICD-10-CM | POA: Insufficient documentation

## 2021-04-14 LAB — CBC WITH DIFFERENTIAL/PLATELET
Abs Immature Granulocytes: 0.03 10*3/uL (ref 0.00–0.07)
Basophils Absolute: 0 10*3/uL (ref 0.0–0.1)
Basophils Relative: 0 %
Eosinophils Absolute: 0.1 10*3/uL (ref 0.0–0.5)
Eosinophils Relative: 1 %
HCT: 30.1 % — ABNORMAL LOW (ref 36.0–46.0)
Hemoglobin: 9.9 g/dL — ABNORMAL LOW (ref 12.0–15.0)
Immature Granulocytes: 0 %
Lymphocytes Relative: 14 %
Lymphs Abs: 1.3 10*3/uL (ref 0.7–4.0)
MCH: 32 pg (ref 26.0–34.0)
MCHC: 32.9 g/dL (ref 30.0–36.0)
MCV: 97.4 fL (ref 80.0–100.0)
Monocytes Absolute: 0.8 10*3/uL (ref 0.1–1.0)
Monocytes Relative: 9 %
Neutro Abs: 7.2 10*3/uL (ref 1.7–7.7)
Neutrophils Relative %: 76 %
Platelets: 217 10*3/uL (ref 150–400)
RBC: 3.09 MIL/uL — ABNORMAL LOW (ref 3.87–5.11)
RDW: 13.1 % (ref 11.5–15.5)
WBC: 9.6 10*3/uL (ref 4.0–10.5)
nRBC: 0 % (ref 0.0–0.2)

## 2021-04-14 LAB — BASIC METABOLIC PANEL
Anion gap: 7 (ref 5–15)
BUN: 45 mg/dL — ABNORMAL HIGH (ref 8–23)
CO2: 25 mmol/L (ref 22–32)
Calcium: 8.7 mg/dL — ABNORMAL LOW (ref 8.9–10.3)
Chloride: 102 mmol/L (ref 98–111)
Creatinine, Ser: 1.2 mg/dL — ABNORMAL HIGH (ref 0.44–1.00)
GFR, Estimated: 43 mL/min — ABNORMAL LOW (ref >60)
Glucose, Bld: 121 mg/dL — ABNORMAL HIGH (ref 70–99)
Potassium: 4.3 mmol/L (ref 3.5–5.1)
Sodium: 134 mmol/L — ABNORMAL LOW (ref 135–145)

## 2021-04-14 LAB — URINALYSIS, MICROSCOPIC (REFLEX): WBC, UA: >50 WBC/hpf (ref 0–5)

## 2021-04-14 LAB — URINALYSIS, ROUTINE W REFLEX MICROSCOPIC
Bilirubin Urine: NEGATIVE
Glucose, UA: NEGATIVE mg/dL
Ketones, ur: NEGATIVE mg/dL
Nitrite: POSITIVE — AB
Protein, ur: 100 mg/dL — AB
Specific Gravity, Urine: 1.02 (ref 1.005–1.030)
pH: 6 (ref 5.0–8.0)

## 2021-04-14 LAB — MAGNESIUM: Magnesium: 2 mg/dL (ref 1.7–2.4)

## 2021-04-14 MED ORDER — ACETAMINOPHEN 325 MG PO TABS
650.0000 mg | ORAL_TABLET | Freq: Once | ORAL | Status: AC
  Administered 2021-04-14: 650 mg via ORAL
  Filled 2021-04-14: qty 2

## 2021-04-14 MED ORDER — SULFAMETHOXAZOLE-TRIMETHOPRIM 800-160 MG PO TABS
1.0000 | ORAL_TABLET | Freq: Once | ORAL | Status: AC
  Administered 2021-04-14: 1 via ORAL
  Filled 2021-04-14: qty 1

## 2021-04-14 MED ORDER — SULFAMETHOXAZOLE-TRIMETHOPRIM 800-160 MG PO TABS: 1.0000 | ORAL_TABLET | Freq: Two times a day (BID) | ORAL | 0 refills | Status: DC

## 2021-04-14 NOTE — ED Provider Notes (Signed)
[facility] EMERGENCY DEPARTMENT Provider Note   CSN: [id number] Arrival date & time: 04/14/21  1404     History Chief Complaint  Patient presents with   Bethany Park    Bethany Park is a 87 y.o. female.  Patient with history of restless leg syndrome, previous stroke on Plavix, aortic stenosis --presents to the emergency department for evaluation after a fall occurring around 3 AM.  Patient resides at home with her daughter.  She states that she got up to get out of bed and fell striking the back of her head.  Fall was heard by the patient's daughter who attended to her shortly after the incident.  She was awake and alert.  She was complaining that her feet were painful and spasm.  Patient was acting normally.  She had some nausea prior to going to bed last night which persisted this morning.  No vomiting or worsening confusion.  Daughter had given her a dose of Requip and she went back to bed.  They went to urgent care prior to arrival here and was referred to the emergency department.  She denies any hip pain.  She has been able to ambulate with assistance, typically she uses a walker, at her baseline.  No significant headache.  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        Past Medical History:  Diagnosis Date   Anemia    Aortic stenosis    Atrial fibrillation ([place])    B12 deficiency 03/13/2018   Colon polyp 09/2013   in [place] state.  for personal hx polyps and fm hx colon ca.  found small transverse polyp, diverticulosis, int hemorrhoids.     Dementia ([place])    Depression with anxiety 03/13/2018   Essential hypertension 03/13/2018   Glucose intolerance 03/13/2018   Heart attack ([place])    Heart failure (HCC)    History of COPD    History of coronary artery stent placement 09/2017   Moderate aortic stenosis 03/13/2018   Osteoporosis 03/13/2018   Ovarian cancer ([place])    Restless leg syndrome 03/13/2018   Stroke [facility])     Patient Active Problem List   Diagnosis Date Noted    Primary osteoarthritis of left shoulder 07/22/2020   Lumbar spinal stenosis 06/18/2020   Vertigo 03/17/2020   Laceration of scalp 12/24/2019   Metatarsalgia of both feet 12/24/2019   Subarachnoid hemorrhage ([place]) 11/11/2018   Laceration of right forearm 05/11/2018   Sundowning 05/11/2018   Chronic low back pain 04/26/2018   Chronic right hip pain 04/26/2018   Melena    UGIB (upper gastrointestinal bleed) 04/15/2018   Anemia, blood loss 04/15/2018   Depression 04/15/2018   CAD (coronary artery disease) 04/15/2018   Fall 04/15/2018   Hyponatremia 04/15/2018   Acute renal failure superimposed on stage 3 chronic kidney disease ([place]) 04/15/2018   UTI (urinary tract infection) 04/15/2018   Hypotension    Ambulatory dysfunction 04/03/2018   Generalized abdominal pain 03/28/2018   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/13/2018   Diabetic macular edema ([place]) 03/13/2018   Mild nonproliferative diabetic retinopathy ([place]) 03/13/2018   Atrial fibrillation ([place]) 03/13/2018   Essential hypertension 03/13/2018   History of coronary artery stent placement 03/13/2018   ASCVD (arteriosclerotic cardiovascular disease) 03/13/2018   Moderate aortic stenosis 03/13/2018   Dementia ([place]) 03/13/2018   HLD (hyperlipidemia) 03/13/2018   B12 deficiency 03/13/2018   Osteoporosis 03/13/2018   Mild sleep apnea 03/13/2018   Colon polyp 03/13/2018  Restless leg syndrome 03/13/2018   Macular degeneration 03/13/2018   Anemia 03/13/2018   Depression with anxiety 03/13/2018   Overactive bladder 03/13/2018   History of CVA (cerebrovascular accident) 03/13/2018   Glucose intolerance 03/13/2018   Crohn disease ([place]) 03/13/2018    Past Surgical History:  Procedure Laterality Date   ABDOMINAL HYSTERECTOMY     BACK SURGERY     BREAST SURGERY     CHOLECYSTECTOMY     CORONARY STENT PLACEMENT     ESOPHAGOGASTRODUODENOSCOPY N/A 04/16/2018   Procedure: ESOPHAGOGASTRODUODENOSCOPY (EGD);   Surgeon: Ladene Artist, MD;  Location: [facility] ENDOSCOPY;  Service: Endoscopy;  Laterality: N/A;   EYE SURGERY     HEMORROIDECTOMY     HOT HEMOSTASIS N/A 04/16/2018   Procedure: HOT HEMOSTASIS (ARGON PLASMA COAGULATION/BICAP);  Surgeon: Ladene Artist, MD;  Location[facility] END[facility];  Service: Endoscopy;  Laterality: N/A;   JOINT REPLACEMENT       OB History   No obstetric history on file.     Family History  Problem Relation Age of Onset   Heart disease Mother    Diabetes Mellitus II Mother    Stroke Mother    Colon cancer Father     Social History   Tobacco Use   Smoking status: Never   Smokeless tobacco: Never  Vaping Use   Vaping Use: Never used  Substance Use Topics   Alcohol use: Not Currently   Drug use: Not Currently    Home Medications Prior to Admission medications   Medication Sig Start Date End Date Taking? Authorizing Provider  albuterol (PROVENTIL HFA;VENTOLIN HFA) 108 (90 Base) MCG/ACT inhaler Inhale 2 puffs into the lungs every 6 (six) hours as needed for wheezing. 06/07/18   Emeterio Reeve, DO  AMBULATORY NON [facility] bed with rails 04/26/18   Emeterio Reeve, DO  AMBULATORY NON [facility] MEDICATION Wheelchair 04/26/18   Emeterio Reeve, DO  AMBULATORY NON FORMULARY MEDICATION Physical therapy eval and treat for dx: ambulatory dysfunction, charcot marie tooth syndrome, frequent falls. Fax attnManson Passey [phone] 06/05/19   Emeterio Reeve, DO  AMBULATORY NON FORMULARY MEDICATION Medication Name: handicap lift for shower/bathtub Fax to [facility] in [place] 09/16/20   Emeterio Reeve, DO  AMBULATORY NON FORMULARY MEDICATION Medication Name: Mepilex 4 inch x 4 inch or larger sacral foam pad or can substitute similar to place on sacral region and change every 1-2 days 11/11/20   Emeterio Reeve, DO  amitriptyline (ELAVIL) 25 MG tablet Take 1 tablet (25 mg total) by mouth at bedtime. 04/26/18   Emeterio Reeve, DO  atorvastatin (LIPITOR) 40 MG tablet Take 1 tablet (40 mg total) by mouth daily. 02/11/21   Emeterio Reeve, DO  carvedilol (COREG) 3.125 MG tablet Take 1 tablet (3.125 mg total) by mouth 2 (two) times daily with a meal. 02/11/21   Emeterio Reeve, DO  clopidogrel (PLAVIX) 75 MG tablet Take 1 tablet (75 mg total) by mouth daily. Please schedule annual appt with Dr.Crenshaw for refills. [phone]. 1st attempt. 02/13/20   Lelon Perla, MD  diphenoxylate-atropine (LOMOTIL) 2.5-0.025 MG tablet Take 1 tablet by mouth 4 (four) times daily as needed for diarrhea or loose stools. 09/14/20   Emeterio Reeve, DO  famotidine (PEPCID) 40 MG tablet Take 1 tablet (40 mg total) by mouth daily. 11/11/19   Emeterio Reeve, DO  fluconazole (DIFLUCAN) 150 MG tablet Take 1 tablet (150 mg total) by mouth daily. Repeat in 1 week if needed 03/10/21   Raylene Everts,  MD  gabapentin (NEURONTIN) 300 MG capsule Take 3 capsules (900 mg total) by mouth at bedtime. 02/11/21   Emeterio Reeve, DO  lisinopril (ZESTRIL) 2.5 MG tablet Take 1 tablet (2.5 mg total) by mouth daily. 02/11/21   Emeterio Reeve, DO  LORazepam (ATIVAN) 0.5 MG tablet TAKE 1/2 TO 1 (ONE-HALF TO ONE) TABLET BY MOUTH AT BEDTIME AS NEEDED FOR ANXIETY OR SLEEP 02/11/21   Emeterio Reeve, DO  Melatonin 10 MG SUBL Place 10 mg under the tongue at bedtime. 04/03/18   Emeterio Reeve, DO  nitroGLYCERIN (NITROSTAT) 0.4 MG SL tablet Place 1 tablet (0.4 mg total) under the tongue every 5 (five) minutes as needed for chest pain. If no better 10-15 mins, go to hospital 03/27/18   Emeterio Reeve, DO  nystatin (MYCOSTATIN/NYSTOP) powder Apply topically 4 (four) times daily. 12/24/19   Emeterio Reeve, DO  ondansetron (ZOFRAN-ODT) 8 MG disintegrating tablet Take 1 tablet (8 mg total) by mouth every 8 (eight) hours as needed for nausea. 09/14/20   Emeterio Reeve, DO  oxyCODONE-acetaminophen (PERCOCET) 10-325 MG tablet Take 1 tablet  by mouth every 8 (eight) hours as needed for pain. 02/09/21   Silverio Decamp, MD  rOPINIRole (REQUIP) 3 MG tablet Take 1 tablet (3 mg total) by mouth 3 (three) times daily. 02/11/21   Emeterio Reeve, DO  umeclidinium-vilanterol (ANORO ELLIPTA) 62.5-25 MCG/INH AEPB Inhale 1 puff into the lungs daily. 06/07/18   Emeterio Reeve, DO    Allergies    Alendronate sodium, Benztropine, Citalopram, Duloxetine hcl, Erythromycin, Fluoxetine hcl, Glimepiride, Nortriptyline hcl, Latex, Penicillins, and Quetiapine fumarate  Review of Systems   Review of Systems  Constitutional:  Negative for fever.  HENT:  Negative for congestion, dental problem, rhinorrhea and sinus pressure.   Eyes:  Negative for photophobia, discharge, redness and visual disturbance.  Respiratory:  Negative for shortness of breath.   Cardiovascular:  Negative for chest pain.  Gastrointestinal:  Positive for nausea. Negative for vomiting.  Musculoskeletal:  Positive for myalgias. Negative for gait problem, neck pain and neck stiffness.  Skin:  Negative for rash.  Neurological:  Negative for syncope, speech difficulty, weakness, light-headedness, numbness and headaches.  Psychiatric/Behavioral:  Negative for confusion.    Physical Exam Updated Vital Signs BP 127/67 (BP Location: Right Arm)   Pulse 68   Temp 99.2 F (37.3 C) (Oral)   Resp 20   Ht 5' 3"  (1.6 m)   Wt 61.2 kg   SpO2 98%   BMI 23.91 kg/m   Physical Exam Vitals and nursing note reviewed.  Constitutional:      Appearance: She is well-developed.  HENT:     Head: Normocephalic and atraumatic. No raccoon eyes or Battle's sign.     Comments: Patient reports some tenderness around the left occipital area however no hematoma, laceration, depressions noted at time of exam.    Right Ear: Tympanic membrane, ear canal and external ear normal. No hemotympanum.     Left Ear: Tympanic membrane, ear canal and external ear normal. No hemotympanum.     Nose: Nose  normal.     Mouth/Throat:     Pharynx: Uvula midline.  Eyes:     General: Lids are normal.     Extraocular Movements:     Right eye: No nystagmus.     Left eye: No nystagmus.     Comments: Right pupil is mildly constricted and reactive to light.  Left pupil is midrange and nonreactive to light consistent with prior surgery.  Daughter states  that her pupils appear normal for her.  Neck:     Comments: Full active range of motion during history without any evidence of pain. Cardiovascular:     Rate and Rhythm: Normal rate and regular rhythm.     Heart sounds: Murmur heard.     Comments: 3 out of 6 midsystolic murmur heard over the left sternal border Pulmonary:     Effort: Pulmonary effort is normal.     Breath sounds: Normal breath sounds.  Abdominal:     Palpations: Abdomen is soft.     Tenderness: There is no abdominal tenderness.  Musculoskeletal:     Cervical back: Normal range of motion and neck supple. No tenderness or bony tenderness.     Thoracic back: No tenderness or bony tenderness.     Lumbar back: No tenderness or bony tenderness.     Comments: Patient is able to actively flex both hips without pain.  No current pain in the feet.  Normal passive range of motion of the knees and ankles bilaterally.  Skin:    General: Skin is warm and dry.  Neurological:     Mental Status: She is alert and oriented to person, place, and time.     GCS: GCS eye subscore is 4. GCS verbal subscore is 5. GCS motor subscore is 6.     Cranial Nerves: No cranial nerve deficit.     Sensory: No sensory deficit.     Coordination: Coordination normal.     Deep Tendon Reflexes: Reflexes are normal and symmetric.    ED Results / Procedures / Treatments   Labs (all labs ordered are listed, but only abnormal results are displayed) Labs Reviewed - No data to display  EKG None  Radiology No results found.  Procedures Procedures   Medications Ordered in ED Medications - No data to  display  ED Course  I have reviewed the triage vital signs and the nursing notes.  Pertinent labs & imaging results that were available during my care of the patient were reviewed by me and considered in my medical decision making (see chart for details)  Patient seen and examined. Work-up initiated.  Will obtain CT head noncontrast, basic lab work including magnesium, UA.  Anticipate discharge to home if work-up is reassuring.  Vital signs reviewed and are as follows: BP 127/67 (BP Location: Right Arm)   Pulse 68   Temp 99.2 F (37.3 C) (Oral)   Resp 20   Ht 5' 3"  (1.6 m)   Wt 61.2 kg   SpO2 98%   BMI 23.91 kg/m         3:47 PM patient has had some back and leg pain during ED stay.  Tylenol was ordered.  I reassessed the patient's lower extremity and back.  She states that the pain comes and goes and daughter states that this is typical chronic pain for her.  No indication for further imaging.  Reviewed imaging and lab evaluation with patient and daughter at bedside.  She has signs of UTI.  Plan to treat with Bactrim.  Culture sent.  Patient has multiple drug allergies.  Will avoid cephalosporin due to penicillin allergy, nitrofurantoin due to age.  Would consider fosfomycin potentially as I do not suspect she has a complicated UTI --if Bactrim fails for some reason.  Otherwise, patient is motivated to leave and daughter has no concerns.  Plan for discharge.    MDM Rules/Calculators/A&P  Patient with a fall in the early morning hours.  This sounds like it was associated due to spasms that she was having in her feet at the time.  She did hurt her head.  Imaging of the head is negative and patient has a reassuring neck exam.  Checked labs for electrolytes and these were reassuring.  UA with signs of infection.  Treatment plan as above.  No indication for further work-up or admission.  She has been ambulatory since the fall and denies any focal hip  pain.   Final Clinical Impression(s) / ED Diagnoses Final diagnoses:  Fall, initial encounter  Acute cystitis without hematuria    Rx / DC Orders ED Discharge Orders     None        Carlisle Cater, PA-C 04/14/21 [place], [place], DO 04/16/21 [phone]

## 2021-04-14 NOTE — Discharge Instructions (Signed)
Please read and follow all provided instructions.  Your diagnoses today include:  1. Fall, initial encounter   2. Acute cystitis without hematuria     Tests performed today include: Urine test - suggests that you have an infection in your bladder CT head - no bleeding or other concerning acute injuries Blood counts and electrolytes -look okay Vital signs. See below for your results today.   Medications prescribed:  Bactrim (trimethoprim/sulfamethoxazole) - antibiotic  You have been prescribed an antibiotic medicine: take the entire course of medicine even if you are feeling better. Stopping early can cause the antibiotic not to work.  Home care instructions:  Follow any educational materials contained in this packet.  Follow-up instructions: Please follow-up with your primary care provider in 3 days if symptoms are not resolved for further evaluation of your symptoms.  Return instructions:  Please return to the Emergency Department if you experience worsening symptoms.  Return with fever, worsening pain, persistent vomiting, worsening pain in your back.  Please return if you have any other emergent concerns.  Additional Information:  Your vital signs today were: BP (!) 114/53 (BP Location: Right Arm)   Pulse (!) 58 Comment: laying down  Temp 99.2 F (37.3 C) (Oral)   Resp 18   Ht 5' 3"  (1.6 m)   Wt 61.2 kg   SpO2 95%   BMI 23.91 kg/m  If your blood pressure (BP) was elevated above 135/85 this visit, please have this repeated by your doctor within one month. --------------

## 2021-04-14 NOTE — ED Triage Notes (Signed)
Per pt and daughter pt fell ~3am-struck back of head-no LOC-c/o her she fell due to her feet were numb-pt NAD-to triage in w/c

## 2021-04-15 ENCOUNTER — Telehealth: Payer: Self-pay

## 2021-04-15 NOTE — Telephone Encounter (Signed)
Transition Care Management Unsuccessful Follow-up Telephone Call  Date of discharge and from where:  04/14/2021 [facility]  Attempts:  1st Attempt  Reason for unsuccessful TCM follow-up call:  Unable to leave message

## 2021-04-16 NOTE — Telephone Encounter (Signed)
Transition Care Management Unsuccessful Follow-up Telephone Call  Date of discharge and from where:  04/14/2021 - [facility]  Attempts:  2nd Attempt  Reason for unsuccessful TCM follow-up call:  Unable to leave message

## 2021-04-17 LAB — URINE CULTURE: Culture: 80000 — AB

## 2021-04-18 ENCOUNTER — Other Ambulatory Visit: Payer: Self-pay | Admitting: Osteopathic Medicine

## 2021-04-19 NOTE — Telephone Encounter (Signed)
Transition Care Management Follow-up Telephone Call Date of discharge and from where: 04/14/2021 from [facility] How have you been since you were released from the hospital? Spoke to pt daughter and states she is not feeling well and that it may be from the abx.  Any questions or concerns? No  Items Reviewed: Did the pt receive and understand the discharge instructions provided? Yes  Medications obtained and verified? Yes  Other? No  Any new allergies since your discharge? No  Dietary orders reviewed? No Do you have support at home? Yes   Functional Questionnaire: (I = Independent and D = Dependent) ADLs: I - dtr states that pt is feeling dizzy some.   Bathing/Dressing- I with assistance  Meal Prep- I  Eating- I  Maintaining continence- I  Transferring/Ambulation- I  Managing Meds- D   Follow up appointments reviewed:  PCP Hospital f/u appt confirmed? Yes  Scheduled to see Emeterio Reeve, DO on 04/20/2021 @ 10:50am. [place] [facility] f/u appt confirmed? No   Are transportation arrangements needed? No  If their condition worsens, is the pt aware to call PCP or go to the Emergency Dept.? Yes Was the patient provided with contact information for the PCP's office or ED? Yes Was to pt encouraged to call back with questions or concerns? Yes

## 2021-04-20 ENCOUNTER — Ambulatory Visit (INDEPENDENT_AMBULATORY_CARE_PROVIDER_SITE_OTHER): Payer: Medicare Other | Admitting: Osteopathic Medicine

## 2021-04-20 VITALS — BP 130/66 | HR 69 | Temp 97.8°F | Wt 132.0 lb

## 2021-04-20 DIAGNOSIS — F0391 Unspecified dementia with behavioral disturbance: Secondary | ICD-10-CM

## 2021-04-20 DIAGNOSIS — R296 Repeated falls: Secondary | ICD-10-CM

## 2021-04-20 DIAGNOSIS — R42 Dizziness and giddiness: Secondary | ICD-10-CM

## 2021-04-20 DIAGNOSIS — M48061 Spinal stenosis, lumbar region without neurogenic claudication: Secondary | ICD-10-CM

## 2021-04-20 DIAGNOSIS — R262 Difficulty in walking, not elsewhere classified: Secondary | ICD-10-CM

## 2021-04-20 DIAGNOSIS — N3000 Acute cystitis without hematuria: Secondary | ICD-10-CM

## 2021-04-20 NOTE — Patient Instructions (Signed)
Would strongly consider placement in nursing facility given frequent falling. I'm concerned about her managing her own medications, particularly the Requip, anxiety meds, pain meds. Bethany Park can manage medications - this will be safest option.

## 2021-04-20 NOTE — Progress Notes (Signed)
Bethany Park is a 87 y.o. female who presents to  [place] at [facility]  today, 04/20/21, seeking care for the following:  Frequent falling four to five times per week - recent ER visit, recent UC visit, recent Dx UTI  Dementia - behavioral disturbance, pt is insisting on managing her own medications. Daughter is still managing supply for some of these but pt does have some access to meds at home. Pt is adamant about not using her walker around the home.      ASSESSMENT & PLAN with other pertinent findings:  The primary encounter diagnosis was Frequent falls. Diagnoses of Spinal stenosis of lumbar region without neurogenic claudication, Dementia with behavioral disturbance, unspecified dementia type ([place]), Vertigo, Ambulatory dysfunction, and Acute cystitis without hematuria were also pertinent to this visit.    Patient Instructions  Would strongly consider placement in nursing facility given frequent falling. I'm concerned about her managing her own medications, particularly the Requip, anxiety meds, pain meds. LaRae can manage medications - this will be safest option.   Orders Placed This Encounter  Procedures   Urine Culture   Urinalysis, Routine w reflex microscopic     No orders of the defined types were placed in this encounter.    See below for relevant physical exam findings  See below for recent lab and imaging results reviewed  Medications, allergies, PMH, PSH, SocH, FamH reviewed below    Follow-up instructions: Return in about 6 months (around 10/20/2021) for ROUTINE CHECK-UP, SEE [place] SOONER IF NEEDED. CALL/MESSAGE W/ QUESTIONS.                                        Exam:  BP 130/66 (BP Location: Left Arm, Patient Position: Sitting, Cuff Size: Normal)   Pulse 69   Temp 97.8 F (36.6 C) (Oral)   Wt 132 lb 0.6 oz (59.9 kg)   BMI 23.39 kg/m  Constitutional: VS see above. General  Appearance: alert, well-developed, well-nourished, NAD Neck: No masses, trachea midline.  Respiratory: Normal respiratory effort. no wheeze, no rhonchi, no rales Cardiovascular: S1/S2 normal, (+)1/0 systolic murmur, no rub/gallop auscultated. RRR.  Musculoskeletal: Gait stable with walker but unsteady Skin: warm, dry, intact.  Psychiatric: Poor judgment/insight. Angry mood and affect. Oriented x person.   Current Meds  Medication Sig   albuterol (PROVENTIL HFA;VENTOLIN HFA) 108 (90 Base) MCG/ACT inhaler Inhale 2 puffs into the lungs every 6 (six) hours as needed for wheezing.   [facility] bed with rails   AMBULATORY NON FORMULARY MEDICATION Wheelchair   AMBULATORY NON FORMULARY MEDICATION Physical therapy eval and treat for dx: ambulatory dysfunction, charcot marie tooth syndrome, frequent falls. Fax attn: Manson Passey [phone]   AMBULATORY NON FORMULARY MEDICATION Medication Name: handicap lift for shower/bathtub Fax to [facility] in [place] Medication Name: Mepilex 4 inch x 4 inch or larger sacral foam pad or can substitute similar to place on sacral region and change every 1-2 days   amitriptyline (ELAVIL) 25 MG tablet Take 1 tablet (25 mg total) by mouth at bedtime.   atorvastatin (LIPITOR) 40 MG tablet Take 1 tablet (40 mg total) by mouth daily.   carvedilol (COREG) 3.125 MG tablet Take 1 tablet (3.125 mg total) by mouth 2 (two) times daily with a meal.   clopidogrel (PLAVIX) 75 MG tablet Take  1 tablet (75 mg total) by mouth daily. Please schedule annual appt with Dr.Crenshaw for refills. [phone]. 1st attempt.   diphenoxylate-atropine (LOMOTIL) 2.5-0.025 MG tablet Take 1 tablet by mouth 4 (four) times daily as needed for diarrhea or loose stools.   famotidine (PEPCID) 40 MG tablet Take 1 tablet (40 mg total) by mouth daily.   gabapentin (NEURONTIN) 300 MG capsule Take 3 capsules (900 mg total)  by mouth at bedtime.   lisinopril (ZESTRIL) 2.5 MG tablet Take 1 tablet (2.5 mg total) by mouth daily.   LORazepam (ATIVAN) 0.5 MG tablet TAKE 1/2 TO 1 (ONE-HALF TO ONE) TABLET BY MOUTH AT BEDTIME AS NEEDED FOR ANXIETY OR SLEEP   Melatonin 10 MG SUBL Place 10 mg under the tongue at bedtime.   nitroGLYCERIN (NITROSTAT) 0.4 MG SL tablet Place 1 tablet (0.4 mg total) under the tongue every 5 (five) minutes as needed for chest pain. If no better 10-15 mins, go to hospital   nystatin (MYCOSTATIN/NYSTOP) powder Apply topically 4 (four) times daily.   ondansetron (ZOFRAN-ODT) 8 MG disintegrating tablet Take 1 tablet (8 mg total) by mouth every 8 (eight) hours as needed for nausea.   oxyCODONE-acetaminophen (PERCOCET) 10-325 MG tablet Take 1 tablet by mouth every 8 (eight) hours as needed for pain.   rOPINIRole (REQUIP) 3 MG tablet Take 1 tablet (3 mg total) by mouth 3 (three) times daily.   umeclidinium-vilanterol (ANORO ELLIPTA) 62.5-25 MCG/INH AEPB Inhale 1 puff into the lungs daily.    Allergies  Allergen Reactions   Alendronate Sodium Other (See Comments)   Benztropine Other (See Comments)   Citalopram Other (See Comments)   Duloxetine Hcl Other (See Comments)   Erythromycin Other (See Comments)   Fluoxetine Hcl Other (See Comments)   Glimepiride Other (See Comments)   Nortriptyline Hcl Other (See Comments)   Latex Other (See Comments)    Per daughter    Penicillins Other (See Comments)   Quetiapine Fumarate Other (See Comments)    As per daughter, hallucinations.     Patient Active Problem List   Diagnosis Date Noted   Primary osteoarthritis of left shoulder 07/22/2020   Lumbar spinal stenosis 06/18/2020   Vertigo 03/17/2020   Laceration of scalp 12/24/2019   Metatarsalgia of both feet 12/24/2019   Subarachnoid hemorrhage ([place]) 11/11/2018   Laceration of right forearm 05/11/2018   Sundowning 05/11/2018   Chronic low back pain 04/26/2018   Chronic right hip pain 04/26/2018    Melena    UGIB (upper gastrointestinal bleed) 04/15/2018   Anemia, blood loss 04/15/2018   Depression 04/15/2018   CAD (coronary artery disease) 04/15/2018   Fall 04/15/2018   Hyponatremia 04/15/2018   Acute renal failure superimposed on stage 3 chronic kidney disease ([place]) 04/15/2018   UTI (urinary tract infection) 04/15/2018   Hypotension    Ambulatory dysfunction 04/03/2018   Generalized abdominal pain 03/28/2018   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/13/2018   Diabetic macular edema ([place]) 03/13/2018   Mild nonproliferative diabetic retinopathy ([place]) 03/13/2018   Atrial fibrillation ([place]) 03/13/2018   Essential hypertension 03/13/2018   History of coronary artery stent placement 03/13/2018   ASCVD (arteriosclerotic cardiovascular disease) 03/13/2018   Moderate aortic stenosis 03/13/2018   Dementia ([place]) 03/13/2018   HLD (hyperlipidemia) 03/13/2018   B12 deficiency 03/13/2018   Osteoporosis 03/13/2018   Mild sleep apnea 03/13/2018   Colon polyp 03/13/2018   Restless leg syndrome 03/13/2018   Macular degeneration 03/13/2018   Anemia 03/13/2018   Depression with  anxiety 03/13/2018   Overactive bladder 03/13/2018   History of CVA (cerebrovascular accident) 03/13/2018   Glucose intolerance 03/13/2018   Crohn disease ([place]) 03/13/2018    Family History  Problem Relation Age of Onset   Heart disease Mother    Diabetes Mellitus II Mother    Stroke Mother    Colon cancer Father     Social History   Tobacco Use  Smoking Status Never  Smokeless Tobacco Never    Past Surgical History:  Procedure Laterality Date   ABDOMINAL HYSTERECTOMY     BACK SURGERY     BREAST SURGERY     CHOLECYSTECTOMY     CORONARY STENT PLACEMENT     ESOPHAGOGASTRODUODENOSCOPY N/A 04/16/2018   Procedure: ESOPHAGOGASTRODUODENOSCOPY (EGD);  Surgeon: Ladene Artist, MD;  Location: [facility] ENDOSCOPY;  Service: Endoscopy;  Laterality: N/A;   EYE SURGERY     HEMORROIDECTOMY      HOT HEMOSTASIS N/A 04/16/2018   Procedure: HOT HEMOSTASIS (ARGON PLASMA COAGULATION/BICAP);  Surgeon: Ladene Artist, MD;  Location: [facility] ENDOSCOPY;  Service: Endoscopy;  Laterality: N/A;   JOINT REPLACEMENT      Immunization History  Administered Date(s) Administered   Influenza, High Dose Seasonal PF 09/17/2018   Tdap 12/19/2019    Recent Results (from the past 2160 hour(s))  CBC with Differential/Platelet     Status: Abnormal   Collection Time: 04/14/21  2:58 PM  Result Value Ref Range   WBC 9.6 4.0 - 10.5 K/uL   RBC 3.09 (L) 3.87 - 5.11 MIL/uL   Hemoglobin 9.9 (L) 12.0 - 15.0 g/dL   HCT 30.1 (L) 36.0 - 46.0 %   MCV 97.4 80.0 - 100.0 fL   MCH 32.0 26.0 - 34.0 pg   MCHC 32.9 30.0 - 36.0 g/dL   RDW 13.1 11.5 - 15.5 %   Platelets 217 150 - 400 K/uL   nRBC 0.0 0.0 - 0.2 %   Neutrophils Relative % 76 %   Neutro Abs 7.2 1.7 - 7.7 K/uL   Lymphocytes Relative 14 %   Lymphs Abs 1.3 0.7 - 4.0 K/uL   Monocytes Relative 9 %   Monocytes Absolute 0.8 0.1 - 1.0 K/uL   Eosinophils Relative 1 %   Eosinophils Absolute 0.1 0.0 - 0.5 K/uL   Basophils Relative 0 %   Basophils Absolute 0.0 0.0 - 0.1 K/uL   Immature Granulocytes 0 %   Abs Immature Granulocytes 0.03 0.00 - 0.07 K/uL    Comment: Performed at [facility], [address] [place], [place] [address]  Basic metabolic panel     Status: Abnormal   Collection Time: 04/14/21  2:58 PM  Result Value Ref Range   Sodium 134 (L) 135 - 145 mmol/L   Potassium 4.3 3.5 - 5.1 mmol/L   Chloride 102 98 - 111 mmol/L   CO2 25 22 - 32 mmol/L   Glucose, Bld 121 (H) 70 - 99 mg/dL    Comment: Glucose reference range applies only to samples taken after fasting for at least 8 hours.   BUN 45 (H) 8 - 23 mg/dL   Creatinine, Ser 1.20 (H) 0.44 - 1.00 mg/dL   Calcium 8.7 (L) 8.9 - 10.3 mg/dL   GFR, Estimated 43 (L) >60 mL/min    Comment: (NOTE) Calculated using the CKD-EPI Creatinine Equation (2021)    Anion gap 7 5 - 15    Comment:  Performed at [facility], [address] [place], [place] [address]  Magnesium     Status: None   Collection Time: 04/14/21  2:58 PM  Result Value Ref Range   Magnesium 2.0 1.7 - 2.4 mg/dL    Comment: Performed at [facility], [address] [place], [place] [address]  Urinalysis, Routine w reflex microscopic Urine, Clean Catch     Status: Abnormal   Collection Time: 04/14/21  3:09 PM  Result Value Ref Range   Color, Urine YELLOW YELLOW   APPearance CLOUDY (A) CLEAR   Specific Gravity, Urine 1.020 1.005 - 1.030   pH 6.0 5.0 - 8.0   Glucose, UA NEGATIVE NEGATIVE mg/dL   Hgb urine dipstick SMALL (A) NEGATIVE   Bilirubin Urine NEGATIVE NEGATIVE   Ketones, ur NEGATIVE NEGATIVE mg/dL   Protein, ur 100 (A) NEGATIVE mg/dL   Nitrite POSITIVE (A) NEGATIVE   Leukocytes,Ua LARGE (A) NEGATIVE    Comment: Performed at [facility], [address] [place], [place] [address]  Urinalysis, Microscopic (reflex)     Status: Abnormal   Collection Time: 04/14/21  3:09 PM  Result Value Ref Range   RBC / HPF 6-10 0 - 5 RBC/hpf   WBC, UA >50 0 - 5 WBC/hpf   Bacteria, UA FEW (A) NONE SEEN   Squamous Epithelial / LPF 0-5 0 - 5    Comment: Performed at [facility], [address] [place], [place] [address]  Urine Culture     Status: Abnormal   Collection Time: 04/14/21  3:31 PM   Specimen: Urine, Random  Result Value Ref Range   Specimen Description      URINE, RANDOM Performed at [facility], [address] [place], [place] [address]    Special Requests      NONE Performed at [facility], [address] [place], [place] [address]    Culture (A)     80,000 COLONIES/mL METHICILLIN RESISTANT STAPHYLOCOCCUS AUREUS   Report Status 04/17/2021 FINAL    Organism ID, Bacteria METHICILLIN RESISTANT STAPHYLOCOCCUS AUREUS (A)       Susceptibility   Methicillin resistant staphylococcus aureus - MIC*    CIPROFLOXACIN >=8 RESISTANT  Resistant     GENTAMICIN <=0.5 SENSITIVE Sensitive     NITROFURANTOIN <=16 SENSITIVE Sensitive     OXACILLIN >=4 RESISTANT Resistant     TETRACYCLINE <=1 SENSITIVE Sensitive     VANCOMYCIN 1 SENSITIVE Sensitive     TRIMETH/SULFA <=10 SENSITIVE Sensitive     CLINDAMYCIN >=8 RESISTANT Resistant     RIFAMPIN <=0.5 SENSITIVE Sensitive     Inducible Clindamycin NEGATIVE Sensitive     * 80,000 COLONIES/mL METHICILLIN RESISTANT STAPHYLOCOCCUS AUREUS    No results found.     All questions at time of visit were answered - patient instructed to contact office with any additional concerns or updates. ER/RTC precautions were reviewed with the patient as applicable.   Please note: manual typing as well as voice recognition software may have been used to produce this document - typos may escape review. Please contact Dr. Sheppard Coil for any needed clarifications.   Total encounter time on date of service, 04/21/21, was 30 minutes spent addressing problems/issues as noted above in [place], including time spent in discussion with patient regarding the HPI, ROS, confirming history, reviewing Assessment & Plan, as well as time spent on coordination of care, record review.

## 2021-05-24 NOTE — Telephone Encounter (Signed)
error 

## 2021-05-31 ENCOUNTER — Telehealth (INDEPENDENT_AMBULATORY_CARE_PROVIDER_SITE_OTHER): Payer: Medicare Other | Admitting: Osteopathic Medicine

## 2021-05-31 ENCOUNTER — Telehealth: Payer: Self-pay | Admitting: Osteopathic Medicine

## 2021-05-31 DIAGNOSIS — S72341D Displaced spiral fracture of shaft of right femur, subsequent encounter for closed fracture with routine healing: Secondary | ICD-10-CM | POA: Diagnosis not present

## 2021-05-31 DIAGNOSIS — M48062 Spinal stenosis, lumbar region with neurogenic claudication: Secondary | ICD-10-CM

## 2021-05-31 DIAGNOSIS — D649 Anemia, unspecified: Secondary | ICD-10-CM | POA: Diagnosis not present

## 2021-05-31 DIAGNOSIS — E871 Hypo-osmolality and hyponatremia: Secondary | ICD-10-CM | POA: Diagnosis not present

## 2021-05-31 NOTE — Telephone Encounter (Signed)
Can we call [facility]: nursing (I beilev) is going out periodically, next time they are at patient's home can they draw labs for CBC/Diff, CMP, Iron and Ferritin - diagnoses Anemia, Hypertension.   Thanks!

## 2021-05-31 NOTE — Progress Notes (Signed)
Telemedicine Visit via  Audio only - telephone (patient preference /  technical difficulty with MyChart video application)  I connected with Bethany Park on 05/31/21 at 4:35 PM  by phone or  telemedicine application as noted above  I verified that I am speaking with or regarding  the correct patient using two identifiers.  Participants: Myself, Dr Emeterio Reeve DO Patient: Bethany Park Patient proxy if applicable: Gae Bon "LaRae" Corinna Capra - daughter Other, if applicable: none  Patient is at home I am in office at [facility]    I discussed the limitations of evaluation and management  by telemedicine and the availability of in person appointments.  The participant(s) above expressed understanding and  agreed to proceed with this appointment via telemedicine.   Reviewed recent CBC/CMP, hyponatremic, anemic      History of Present Illness: Analucia Hush is a 87 y.o. female who would like to discuss recent hospitalization for fall and femur fracture.    Has been home about 2 weeks at this poinst.  Nursing staff home health is coming to the house per daughter.  Pain is fairly well-controlled per patient.  Following w/ ortho..  Patient is not yet ambulatory, participating in home PT, daughter is caretaker.  Has some nausea when taking pills on an empty stomach, daughter is giving her food prior to taking all her meds which is helping      Observations/Objective: There were no vitals taken for this visit. BP Readings from Last 3 Encounters:  04/20/21 130/66  04/14/21 (!) 114/53  03/10/21 (!) 185/75   Exam: Normal Speech.  NAD  Lab and Radiology Results No results found for this or any previous visit (from the past 72 hour(s)). No results found.     Assessment and Plan: 87 y.o. female with The primary encounter diagnosis was Closed displaced spiral fracture of shaft of right femur with routine healing, subsequent encounter. Diagnoses of Spinal  stenosis of lumbar region with neurogenic claudication, Anemia, unspecified type, and Hyponatremia were also pertinent to this visit.  See phone note - will obtain labs via home health nurse if possible to f/u anemia, hyponatremia   Otherwise, stable. Some way to go in terms of ambulation. Pt has no complaints out of the ordinary.   PDMP not reviewed this encounter. No orders of the defined types were placed in this encounter.  No orders of the defined types were placed in this encounter.  There are no Patient Instructions on file for this visit.  Instructions sent via MyChart.   Follow Up Instructions: Return for recheck as needed, pending lab results .    I discussed the assessment and treatment plan with the patient. The patient was provided an opportunity to ask questions and all were answered. The patient agreed with the plan and demonstrated an understanding of the instructions.   The patient was advised to call back or seek an in-person evaluation if any new concerns, if symptoms worsen or if the condition fails to improve as anticipated.  21t minutes of non-face-to-face time was provided during this encounter.      . . . . . . . . . . . . . Marland Kitchen                   Historical information moved to improve visibility of documentation.  Past Medical History:  Diagnosis Date   Anemia    Aortic stenosis    Atrial fibrillation ([place])    B12 deficiency 03/13/2018  Colon polyp 09/2013   in [place] state.  for personal hx polyps and fm hx colon ca.  found small transverse polyp, diverticulosis, int hemorrhoids.     Dementia ([place])    Depression with anxiety 03/13/2018   Essential hypertension 03/13/2018   Glucose intolerance 03/13/2018   Heart attack ([place])    Heart failure ([facility])    History of COPD    History of coronary artery stent placement 09/2017   Moderate aortic stenosis 03/13/2018   Osteoporosis 03/13/2018   Ovarian cancer ([place])     Restless leg syndrome 03/13/2018   Stroke [facility])    Past Surgical History:  Procedure Laterality Date   ABDOMINAL HYSTERECTOMY     BACK SURGERY     BREAST SURGERY     CHOLECYSTECTOMY     CORONARY STENT PLACEMENT     ESOPHAGOGASTRODUODENOSCOPY N/A 04/16/2018   Procedure: ESOPHAGOGASTRODUODENOSCOPY (EGD);  Surgeon: Ladene Artist, MD;  Location: [facility] ENDOSCOPY;  Service: Endoscopy;  Laterality: N/A;   EYE SURGERY     HEMORROIDECTOMY     HOT HEMOSTASIS N/A 04/16/2018   Procedure: HOT HEMOSTASIS (ARGON PLASMA COAGULATION/BICAP);  Surgeon: Ladene Artist, MD;  Location: [facility] ENDOSCOPY;  Service: Endoscopy;  Laterality: N/A;   JOINT REPLACEMENT     Social History   Tobacco Use   Smoking status: Never   Smokeless tobacco: Never  Substance Use Topics   Alcohol use: Not Currently   family history includes Colon cancer in her father; Diabetes Mellitus II in her mother; Heart disease in her mother; Stroke in her mother.  Medications: Current Outpatient Medications  Medication Sig Dispense Refill   albuterol (PROVENTIL HFA;VENTOLIN HFA) 108 (90 Base) MCG/ACT inhaler Inhale 2 puffs into the lungs every 6 (six) hours as needed for wheezing. [facility] bed with rails 1 Units prn   AMBULATORY NON FORMULARY MEDICATION Wheelchair 1 Units prn   AMBULATORY NON FORMULARY MEDICATION Physical therapy eval and treat for dx: ambulatory dysfunction, charcot marie tooth syndrome, frequent falls. Fax attnManson Passey [phone] 1 Units 99   AMBULATORY NON FORMULARY MEDICATION Medication Name: handicap lift for shower/bathtub Fax to [facility] in [place] 1 Units prn   AMBULATORY NON FORMULARY MEDICATION Medication Name: Mepilex 4 inch x 4 inch or larger sacral foam pad or can substitute similar to place on sacral region and change every 1-2 days 30 Units 99   amitriptyline (ELAVIL) 25 MG tablet Take 1 tablet (25 mg total) by mouth at bedtime.  90 tablet 0   atorvastatin (LIPITOR) 40 MG tablet Take 1 tablet (40 mg total) by mouth daily. 90 tablet 1   carvedilol (COREG) 3.125 MG tablet Take 1 tablet (3.125 mg total) by mouth 2 (two) times daily with a meal. 180 tablet 1   clopidogrel (PLAVIX) 75 MG tablet Take 1 tablet (75 mg total) by mouth daily. Please schedule annual appt with Dr.Crenshaw for refills. [phone]. 1st attempt. 30 tablet 0   diphenoxylate-atropine (LOMOTIL) 2.5-0.025 MG tablet Take 1 tablet by mouth 4 (four) times daily as needed for diarrhea or loose stools. 30 tablet 3   famotidine (PEPCID) 40 MG tablet Take 1 tablet (40 mg total) by mouth daily. 90 tablet 3   gabapentin (NEURONTIN) 300 MG capsule Take 3 capsules (900 mg total) by mouth at bedtime. 270 capsule 1   lisinopril (ZESTRIL) 2.5 MG tablet Take 1 tablet (2.5 mg total) by mouth daily. 90 tablet 3   LORazepam (ATIVAN)  0.5 MG tablet TAKE ONE-HALF (1/2) TO ONE TABLET AT BEDTIME AS NEEDED FOR ANXIETY OR SLEEP 90 tablet 0   Melatonin 10 MG SUBL Place 10 mg under the tongue at bedtime. 90 tablet 3   nitroGLYCERIN (NITROSTAT) 0.4 MG SL tablet Place 1 tablet (0.4 mg total) under the tongue every 5 (five) minutes as needed for chest pain. If no better 10-15 mins, go to hospital 20 tablet 1   nystatin (MYCOSTATIN/NYSTOP) powder Apply topically 4 (four) times daily. 60 g 1   ondansetron (ZOFRAN-ODT) 8 MG disintegrating tablet Take 1 tablet (8 mg total) by mouth every 8 (eight) hours as needed for nausea. 20 tablet 3   oxyCODONE-acetaminophen (PERCOCET) 10-325 MG tablet Take 1 tablet by mouth every 8 (eight) hours as needed for pain. 90 tablet 0   rOPINIRole (REQUIP) 3 MG tablet Take 1 tablet (3 mg total) by mouth 3 (three) times daily. 270 tablet 1   umeclidinium-vilanterol (ANORO ELLIPTA) 62.5-25 MCG/INH AEPB Inhale 1 puff into the lungs daily. 60 each 5   No current facility-administered medications for this visit.   Allergies  Allergen Reactions   Alendronate  Sodium Other (See Comments)   Benztropine Other (See Comments)   Citalopram Other (See Comments)   Duloxetine Hcl Other (See Comments)   Erythromycin Other (See Comments)   Fluoxetine Hcl Other (See Comments)   Glimepiride Other (See Comments)   Nortriptyline Hcl Other (See Comments)   Latex Other (See Comments)    Per daughter    Penicillins Other (See Comments)   Quetiapine Fumarate Other (See Comments)    As per daughter, hallucinations.      If phone visit, billing and coding can please add appropriate modifier if needed

## 2021-06-03 ENCOUNTER — Telehealth: Payer: Self-pay

## 2021-06-03 NOTE — Telephone Encounter (Signed)
I spoke with [facility] and they state they would draw the labs but it may be next week.   [place]

## 2021-06-03 NOTE — Telephone Encounter (Signed)
Jamie from [facility] left a vm msg stating  that patient's bp was elevated this morning. Blood pressure reading was 180/100. RN did state that at the time of patient's blood pressure check, patient had not taken her daily meds yet.

## 2021-06-03 NOTE — Telephone Encounter (Signed)
Patietn is known to have labile BP - would recheck in 10 mins if SBP >160 and just let us know if BP continues to be elevated to ER if patient having chest pain/ headache/ SOB

## 2021-06-09 ENCOUNTER — Telehealth: Payer: Self-pay | Admitting: Osteopathic Medicine

## 2021-06-09 DIAGNOSIS — M48061 Spinal stenosis, lumbar region without neurogenic claudication: Secondary | ICD-10-CM

## 2021-06-09 MED ORDER — FUROSEMIDE 20 MG PO TABS: 20.0000 mg | ORAL_TABLET | Freq: Two times a day (BID) | ORAL | 0 refills | Status: DC

## 2021-06-09 NOTE — Telephone Encounter (Signed)
Attempted to contact Bethany Park regarding patient's lab. No answer, unable to leave a vm msg. Vm is full.

## 2021-06-09 NOTE — Telephone Encounter (Signed)
Task completed. RN Roselyn Reef has been updated and aware of provider's recommendation. Per RN, home nurse is due to have a visit with patient on 06/10/21.

## 2021-06-09 NOTE — Telephone Encounter (Signed)
Please call patient / daughter: I got labs back  Anemia is ok  Low sodium is stable - not sure of cause without further workup. I understand getting her here / to lab is difficult. I've added medication to help retain sodium and should also help w/ blood pressure. Would limit excessive water intake and would liberalize salt intake  Should recheck labs in 1 week - can we get home health to collect BMP and urinalysis? Thanks

## 2021-06-16 NOTE — Telephone Encounter (Signed)
Per patient's daughter she was placed in a [facility]. Patient's labs were repeated and her results were better than the last results noted. Pt is tolerating new medication. Pt is currently staying at [facility]. Patient's daughter is requesting a med refill for oxycodone to be sent to preferred pharmacy on file. Pt has 2 pills available.

## 2021-06-17 MED ORDER — OXYCODONE-ACETAMINOPHEN 10-325 MG PO TABS
1.0000 | ORAL_TABLET | Freq: Three times a day (TID) | ORAL | 0 refills | Status: AC | PRN
Start: 2021-06-17 — End: ?

## 2021-06-28 ENCOUNTER — Telehealth: Payer: Self-pay | Admitting: *Deleted

## 2021-07-02 NOTE — Telephone Encounter (Signed)
Opened in error

## 2021-07-10 ENCOUNTER — Other Ambulatory Visit: Payer: Self-pay | Admitting: Osteopathic Medicine

## 2021-07-13 ENCOUNTER — Telehealth: Payer: Self-pay

## 2021-07-13 ENCOUNTER — Telehealth: Payer: Self-pay | Admitting: Osteopathic Medicine

## 2021-07-13 NOTE — Telephone Encounter (Signed)
Pt daughter Gae Bon) came asking if we received a FL2 assisted living form. There was an old version after speaking to [place], but a new one was re-faxed and left in the providers box.

## 2021-07-13 NOTE — Telephone Encounter (Signed)
Attempted a second call to Wells Guiles from [facility] for clarification of request. No answer and unable to leave a vm msg.

## 2021-07-13 NOTE — Telephone Encounter (Signed)
Wells Guiles, RN with [facility] called requesting orders.  However, the orders she was requesting could not be distinguished.  Attempted to return Rebecca's call, but there was no answer and no VM.  Charyl Bigger, CMA

## 2021-07-14 ENCOUNTER — Emergency Department (HOSPITAL_BASED_OUTPATIENT_CLINIC_OR_DEPARTMENT_OTHER): Payer: Medicare Other

## 2021-07-14 ENCOUNTER — Encounter (HOSPITAL_BASED_OUTPATIENT_CLINIC_OR_DEPARTMENT_OTHER): Payer: Self-pay | Admitting: Emergency Medicine

## 2021-07-14 ENCOUNTER — Other Ambulatory Visit: Payer: Self-pay

## 2021-07-14 ENCOUNTER — Emergency Department (HOSPITAL_BASED_OUTPATIENT_CLINIC_OR_DEPARTMENT_OTHER)
Admission: EM | Admit: 2021-07-14 | Discharge: 2021-07-14 | Disposition: A | Discharge status: Home / Self Care | Payer: Medicare Other | Attending: Emergency Medicine | Admitting: Emergency Medicine

## 2021-07-14 DIAGNOSIS — J449 Chronic obstructive pulmonary disease, unspecified: Secondary | ICD-10-CM | POA: Diagnosis not present

## 2021-07-14 DIAGNOSIS — I129 Hypertensive chronic kidney disease with stage 1 through stage 4 chronic kidney disease, or unspecified chronic kidney disease: Secondary | ICD-10-CM | POA: Insufficient documentation

## 2021-07-14 DIAGNOSIS — D631 Anemia in chronic kidney disease: Secondary | ICD-10-CM | POA: Insufficient documentation

## 2021-07-14 DIAGNOSIS — R030 Elevated blood-pressure reading, without diagnosis of hypertension: Secondary | ICD-10-CM | POA: Diagnosis present

## 2021-07-14 DIAGNOSIS — I2510 Atherosclerotic heart disease of native coronary artery without angina pectoris: Secondary | ICD-10-CM | POA: Diagnosis not present

## 2021-07-14 DIAGNOSIS — Z7902 Long term (current) use of antithrombotics/antiplatelets: Secondary | ICD-10-CM | POA: Diagnosis not present

## 2021-07-14 DIAGNOSIS — F0390 Unspecified dementia without behavioral disturbance: Secondary | ICD-10-CM | POA: Diagnosis not present

## 2021-07-14 DIAGNOSIS — N183 Chronic kidney disease, stage 3 unspecified: Secondary | ICD-10-CM | POA: Diagnosis not present

## 2021-07-14 DIAGNOSIS — Z8543 Personal history of malignant neoplasm of ovary: Secondary | ICD-10-CM | POA: Insufficient documentation

## 2021-07-14 DIAGNOSIS — R42 Dizziness and giddiness: Secondary | ICD-10-CM | POA: Diagnosis not present

## 2021-07-14 DIAGNOSIS — I10 Essential (primary) hypertension: Secondary | ICD-10-CM

## 2021-07-14 DIAGNOSIS — Z79899 Other long term (current) drug therapy: Secondary | ICD-10-CM | POA: Insufficient documentation

## 2021-07-14 DIAGNOSIS — R011 Cardiac murmur, unspecified: Secondary | ICD-10-CM | POA: Diagnosis not present

## 2021-07-14 DIAGNOSIS — Z91040 Latex allergy status: Secondary | ICD-10-CM | POA: Diagnosis not present

## 2021-07-14 LAB — CBC WITH DIFFERENTIAL/PLATELET
Abs Immature Granulocytes: 0.03 10*3/uL (ref 0.00–0.07)
Basophils Absolute: 0 10*3/uL (ref 0.0–0.1)
Basophils Relative: 0 %
Eosinophils Absolute: 0.1 10*3/uL (ref 0.0–0.5)
Eosinophils Relative: 1 %
HCT: 32.3 % — ABNORMAL LOW (ref 36.0–46.0)
Hemoglobin: 11.3 g/dL — ABNORMAL LOW (ref 12.0–15.0)
Immature Granulocytes: 0 %
Lymphocytes Relative: 14 %
Lymphs Abs: 1.1 10*3/uL (ref 0.7–4.0)
MCH: 32.8 pg (ref 26.0–34.0)
MCHC: 35 g/dL (ref 30.0–36.0)
MCV: 93.9 fL (ref 80.0–100.0)
Monocytes Absolute: 0.7 10*3/uL (ref 0.1–1.0)
Monocytes Relative: 8 %
Neutro Abs: 6.2 10*3/uL (ref 1.7–7.7)
Neutrophils Relative %: 77 %
Platelets: 281 10*3/uL (ref 150–400)
RBC: 3.44 MIL/uL — ABNORMAL LOW (ref 3.87–5.11)
RDW: 13.6 % (ref 11.5–15.5)
WBC: 8.2 10*3/uL (ref 4.0–10.5)
nRBC: 0 % (ref 0.0–0.2)

## 2021-07-14 LAB — COMPREHENSIVE METABOLIC PANEL WITH GFR
ALT: 16 U/L (ref 0–44)
AST: 28 U/L (ref 15–41)
Albumin: 4.2 g/dL (ref 3.5–5.0)
Alkaline Phosphatase: 76 U/L (ref 38–126)
Anion gap: 10 (ref 5–15)
BUN: 21 mg/dL (ref 8–23)
CO2: 27 mmol/L (ref 22–32)
Calcium: 9.4 mg/dL (ref 8.9–10.3)
Chloride: 90 mmol/L — ABNORMAL LOW (ref 98–111)
Creatinine, Ser: 0.89 mg/dL (ref 0.44–1.00)
GFR, Estimated: >60 mL/min
Glucose, Bld: 132 mg/dL — ABNORMAL HIGH (ref 70–99)
Potassium: 4.1 mmol/L (ref 3.5–5.1)
Sodium: 127 mmol/L — ABNORMAL LOW (ref 135–145)
Total Bilirubin: 0.6 mg/dL (ref 0.3–1.2)
Total Protein: 7.9 g/dL (ref 6.5–8.1)

## 2021-07-14 LAB — URINALYSIS, ROUTINE W REFLEX MICROSCOPIC
Bilirubin Urine: NEGATIVE
Glucose, UA: NEGATIVE mg/dL
Hgb urine dipstick: NEGATIVE
Ketones, ur: NEGATIVE mg/dL
Leukocytes,Ua: NEGATIVE
Nitrite: NEGATIVE
Protein, ur: 30 mg/dL — AB
Specific Gravity, Urine: 1.02 (ref 1.005–1.030)
pH: 7 (ref 5.0–8.0)

## 2021-07-14 LAB — URINALYSIS, MICROSCOPIC (REFLEX): WBC, UA: NONE SEEN WBC/hpf (ref 0–5)

## 2021-07-14 LAB — TROPONIN I (HIGH SENSITIVITY)
Troponin I (High Sensitivity): 11 ng/L
Troponin I (High Sensitivity): 11 ng/L

## 2021-07-14 MED ORDER — CARVEDILOL 6.25 MG PO TABS
3.1250 mg | ORAL_TABLET | Freq: Once | ORAL | Status: AC
  Administered 2021-07-14: 3.125 mg via ORAL
  Filled 2021-07-14: qty 1

## 2021-07-14 NOTE — ED Provider Notes (Signed)
[facility] EMERGENCY DEPARTMENT Provider Note   CSN: [id number] Arrival date & time: 07/14/21  1739     History Chief Complaint  Patient presents with   Hypertension    Leah Thornberry is a 87 y.o. female.   Hypertension Pertinent negatives include no chest pain, no abdominal pain, no headaches and no shortness of breath. Patient presents for asymptomatic hypertension.  At baseline, she is a functional 87 year old.  She lives at home with her daughter.  She ambulates without assistance.  She manages her own medications and meals.  She has a documented history of dementia, although this is likely mild.  She had a recent surgery for a femoral fracture.  Earlier today, she had a follow-up visit with her orthopedic surgeon.  During this visit, blood pressure was elevated greater than 200 SBP.  Patient's daughter states that she has had similar readings at home.  Home blood pressure medications include low-dose lisinopril and Coreg.  Lisinopril is taken in the mornings and Coreg is taken twice a day.  She has received her blood pressure medications today.  Patient's daughter states that her baseline blood pressure is in the range of 180s over 80s.  Patient denies any recent symptoms.  Her daughter corroborates this.  She denies any current chest pain, headache, vision changes, shortness of breath, abdominal pain, nausea, or areas of numbness or weakness.  In the ED, patient states that she does not like her daughter.  She states that she is afraid of her daughter.  She would like to go live in a nursing facility.  These thoughts were also expressed during her orthopedic visit earlier today.  Per chart review, orthopedic office did arrange for a welfare check by social services.  Patient's daughter states that this has been an ongoing complaint by her mother.  They have been at odds for years.  Currently, patient's daughter is her legal guardian.  Patient does have living sons and another  daughter.  Patient's other daughter lives locally but has not seen her mother in years.  Patient's sons live far away.  Patient previously lived in [place] state but moved to the local area to live with her daughter.     Past Medical History:  Diagnosis Date   Anemia    Aortic stenosis    Atrial fibrillation ([place])    B12 deficiency 03/13/2018   Colon polyp 09/2013   in [place].  for personal hx polyps and fm hx colon ca.  found small transverse polyp, diverticulosis, int hemorrhoids.     Dementia ([place])    Depression with anxiety 03/13/2018   Essential hypertension 03/13/2018   Glucose intolerance 03/13/2018   Heart attack ([place])    Heart failure (HCC)    History of COPD    History of coronary artery stent placement 09/2017   Moderate aortic stenosis 03/13/2018   Osteoporosis 03/13/2018   Ovarian cancer ([place])    Restless leg syndrome 03/13/2018   Stroke [facility])     Patient Active Problem List   Diagnosis Date Noted   Primary osteoarthritis of left shoulder 07/22/2020   Lumbar spinal stenosis 06/18/2020   Vertigo 03/17/2020   Laceration of scalp 12/24/2019   Metatarsalgia of both feet 12/24/2019   Subarachnoid hemorrhage ([place]) 11/11/2018   Laceration of right forearm 05/11/2018   Sundowning 05/11/2018   Chronic low back pain 04/26/2018   Chronic right hip pain 04/26/2018   Melena    UGIB (upper gastrointestinal bleed) 04/15/2018   Anemia, blood  loss 04/15/2018   Depression 04/15/2018   CAD (coronary artery disease) 04/15/2018   Fall 04/15/2018   Hyponatremia 04/15/2018   Acute renal failure superimposed on stage 3 chronic kidney disease ([place]) 04/15/2018   UTI (urinary tract infection) 04/15/2018   Hypotension    Ambulatory dysfunction 04/03/2018   Generalized abdominal pain 03/28/2018   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/13/2018   Diabetic macular edema ([place]) 03/13/2018   Mild nonproliferative diabetic retinopathy ([place]) 03/13/2018    Atrial fibrillation ([place]) 03/13/2018   Essential hypertension 03/13/2018   History of coronary artery stent placement 03/13/2018   ASCVD (arteriosclerotic cardiovascular disease) 03/13/2018   Moderate aortic stenosis 03/13/2018   Dementia ([place]) 03/13/2018   HLD (hyperlipidemia) 03/13/2018   B12 deficiency 03/13/2018   Osteoporosis 03/13/2018   Mild sleep apnea 03/13/2018   Colon polyp 03/13/2018   Restless leg syndrome 03/13/2018   Macular degeneration 03/13/2018   Anemia 03/13/2018   Depression with anxiety 03/13/2018   Overactive bladder 03/13/2018   History of CVA (cerebrovascular accident) 03/13/2018   Glucose intolerance 03/13/2018   Crohn disease ([place]) 03/13/2018    Past Surgical History:  Procedure Laterality Date   ABDOMINAL HYSTERECTOMY     BACK SURGERY     BREAST SURGERY     CHOLECYSTECTOMY     CORONARY STENT PLACEMENT     ESOPHAGOGASTRODUODENOSCOPY N/A 04/16/2018   Procedure: ESOPHAGOGASTRODUODENOSCOPY (EGD);  Surgeon: Ladene Artist, MD;  Location: [facility] ENDOSCOPY;  Service: Endoscopy;  Laterality: N/A;   EYE SURGERY     HEMORROIDECTOMY     HOT HEMOSTASIS N/A 04/16/2018   Procedure: HOT HEMOSTASIS (ARGON PLASMA COAGULATION/BICAP);  Surgeon: Ladene Artist, MD;  Location: [facility] ENDOSCOPY;  Service: Endoscopy;  Laterality: N/A;   JOINT REPLACEMENT       OB History   No obstetric history on file.     Family History  Problem Relation Age of Onset   Heart disease Mother    Diabetes Mellitus II Mother    Stroke Mother    Colon cancer Father     Social History   Tobacco Use   Smoking status: Never   Smokeless tobacco: Never  Vaping Use   Vaping Use: Never used  Substance Use Topics   Alcohol use: Not Currently   Drug use: Not Currently    Home Medications Prior to Admission medications   Medication Sig Start Date End Date Taking? Authorizing Provider  albuterol (PROVENTIL HFA;VENTOLIN HFA) 108 (90 Base) MCG/ACT inhaler Inhale 2 puffs into the lungs  every 6 (six) hours as needed for wheezing. 06/07/18   Emeterio Reeve, DO  AMBULATORY NON [facility] bed with rails 04/26/18   Emeterio Reeve, DO  AMBULATORY NON [facility] MEDICATION Wheelchair 04/26/18   Emeterio Reeve, DO  AMBULATORY NON FORMULARY MEDICATION Physical therapy eval and treat for dx: ambulatory dysfunction, charcot marie tooth syndrome, frequent falls. Fax attnManson Passey [phone] 06/05/19   Emeterio Reeve, DO  AMBULATORY NON FORMULARY MEDICATION Medication Name: handicap lift for shower/bathtub Fax to [facility] in [place] 09/16/20   Emeterio Reeve, DO  AMBULATORY NON FORMULARY MEDICATION Medication Name: Mepilex 4 inch x 4 inch or larger sacral foam pad or can substitute similar to place on sacral region and change every 1-2 days 11/11/20   Emeterio Reeve, DO  amitriptyline (ELAVIL) 25 MG tablet Take 1 tablet (25 mg total) by mouth at bedtime. 04/26/18   Emeterio Reeve, DO  atorvastatin (LIPITOR) 40 MG tablet Take 1 tablet (40  mg total) by mouth daily. 02/11/21   Emeterio Reeve, DO  carvedilol (COREG) 3.125 MG tablet Take 1 tablet (3.125 mg total) by mouth 2 (two) times daily with a meal. 02/11/21   Emeterio Reeve, DO  clopidogrel (PLAVIX) 75 MG tablet Take 1 tablet (75 mg total) by mouth daily. Please schedule annual appt with Dr.Crenshaw for refills. [phone]. 1st attempt. 02/13/20   Lelon Perla, MD  diphenoxylate-atropine (LOMOTIL) 2.5-0.025 MG tablet Take 1 tablet by mouth 4 (four) times daily as needed for diarrhea or loose stools. 09/14/20   Emeterio Reeve, DO  famotidine (PEPCID) 40 MG tablet Take 1 tablet (40 mg total) by mouth daily. 11/11/19   Emeterio Reeve, DO  furosemide (LASIX) 20 MG tablet TAKE 1 TABLET BY MOUTH TWICE A DAY WITH BREAKFAST AND WITH LUNCH 07/13/21   Emeterio Reeve, DO  gabapentin (NEURONTIN) 300 MG capsule Take 3 capsules (900 mg total) by mouth at bedtime. 02/11/21    Emeterio Reeve, DO  lisinopril (ZESTRIL) 2.5 MG tablet Take 1 tablet (2.5 mg total) by mouth daily. 02/11/21   Emeterio Reeve, DO  LORazepam (ATIVAN) 0.5 MG tablet TAKE ONE-HALF (1/2) TO ONE TABLET AT BEDTIME AS NEEDED FOR ANXIETY OR SLEEP 05/14/21   Emeterio Reeve, DO  Melatonin 10 MG SUBL Place 10 mg under the tongue at bedtime. 04/03/18   Emeterio Reeve, DO  nitroGLYCERIN (NITROSTAT) 0.4 MG SL tablet Place 1 tablet (0.4 mg total) under the tongue every 5 (five) minutes as needed for chest pain. If no better 10-15 mins, go to hospital 03/27/18   Emeterio Reeve, DO  nystatin (MYCOSTATIN/NYSTOP) powder Apply topically 4 (four) times daily. 12/24/19   Emeterio Reeve, DO  ondansetron (ZOFRAN-ODT) 8 MG disintegrating tablet Take 1 tablet (8 mg total) by mouth every 8 (eight) hours as needed for nausea. 09/14/20   Emeterio Reeve, DO  oxyCODONE-acetaminophen (PERCOCET) 10-325 MG tablet Take 1 tablet by mouth every 8 (eight) hours as needed for pain. 06/17/21   Emeterio Reeve, DO  rOPINIRole (REQUIP) 3 MG tablet Take 1 tablet (3 mg total) by mouth 3 (three) times daily. 02/11/21   Emeterio Reeve, DO  umeclidinium-vilanterol (ANORO ELLIPTA) 62.5-25 MCG/INH AEPB Inhale 1 puff into the lungs daily. 06/07/18   Emeterio Reeve, DO    Allergies    Alendronate sodium, Benztropine, Citalopram, Duloxetine hcl, Erythromycin, Fluoxetine hcl, Glimepiride, Nortriptyline hcl, Latex, Penicillins, and Quetiapine fumarate  Review of Systems   Review of Systems  Constitutional:  Negative for activity change, appetite change, chills, fatigue and fever.  HENT:  Negative for ear pain and sore throat.   Eyes:  Negative for pain and visual disturbance.  Respiratory:  Negative for cough, chest tightness and shortness of breath.   Cardiovascular:  Negative for chest pain, palpitations and leg swelling.  Gastrointestinal:  Negative for abdominal pain, constipation, diarrhea, nausea and vomiting.   Genitourinary:  Negative for dysuria, flank pain and hematuria.  Musculoskeletal:  Negative for arthralgias, back pain, gait problem, joint swelling, myalgias and neck pain.  Skin:  Negative for color change and rash.  Neurological:  Negative for dizziness, tremors, seizures, syncope, facial asymmetry, speech difficulty, weakness, light-headedness, numbness and headaches.  Hematological:  Does not bruise/bleed easily.  Psychiatric/Behavioral:  Negative for confusion and decreased concentration.   All other systems reviewed and are negative.  Physical Exam Updated Vital Signs BP (!) 186/88 (BP Location: Left Arm)   Pulse 67   Temp 98.2 F (36.8 C) (Oral)   Resp 16   Ht  5' 3"  (1.6 m)   Wt 58.5 kg   SpO2 98%   BMI 22.85 kg/m   Physical Exam Vitals and nursing note reviewed.  Constitutional:      General: She is not in acute distress.    Appearance: Normal appearance. She is well-developed and normal weight. She is not ill-appearing, toxic-appearing or diaphoretic.  HENT:     Head: Normocephalic and atraumatic.     Right Ear: External ear normal.     Left Ear: External ear normal.     Nose: Nose normal.  Eyes:     Extraocular Movements: Extraocular movements intact.     Conjunctiva/sclera: Conjunctivae normal.  Cardiovascular:     Rate and Rhythm: Normal rate and regular rhythm.     Heart sounds: Murmur (Chronic) heard.  Pulmonary:     Effort: Pulmonary effort is normal. No respiratory distress.     Breath sounds: Normal breath sounds. No wheezing or rales.  Chest:     Chest wall: No tenderness.  Abdominal:     Palpations: Abdomen is soft.     Tenderness: There is no abdominal tenderness. There is no right CVA tenderness or left CVA tenderness.  Musculoskeletal:        General: Normal range of motion.     Cervical back: Neck supple.     Right lower leg: No edema.     Left lower leg: No edema.  Skin:    General: Skin is warm and dry.     Capillary Refill: Capillary  refill takes less than 2 seconds.     Coloration: Skin is not jaundiced or pale.  Neurological:     General: No focal deficit present.     Mental Status: She is alert and oriented to person, place, and time.     Cranial Nerves: Cranial nerves are intact. No cranial nerve deficit, dysarthria or facial asymmetry.     Sensory: Sensation is intact. No sensory deficit.     Motor: No weakness or abnormal muscle tone.     Coordination: Coordination is intact. Coordination normal.  Psychiatric:        Mood and Affect: Mood normal.        Behavior: Behavior normal.        Thought Content: Thought content normal.        Judgment: Judgment normal.    ED Results / Procedures / Treatments   Labs (all labs ordered are listed, but only abnormal results are displayed) Labs Reviewed  COMPREHENSIVE METABOLIC PANEL - Abnormal; Notable for the following components:      Result Value   Sodium 127 (*)    Chloride 90 (*)    Glucose, Bld 132 (*)    All other components within normal limits  CBC WITH DIFFERENTIAL/PLATELET - Abnormal; Notable for the following components:   RBC 3.44 (*)    Hemoglobin 11.3 (*)    HCT 32.3 (*)    All other components within normal limits  URINALYSIS, ROUTINE W REFLEX MICROSCOPIC - Abnormal; Notable for the following components:   Protein, ur 30 (*)    All other components within normal limits  URINALYSIS, MICROSCOPIC (REFLEX) - Abnormal; Notable for the following components:   Bacteria, UA RARE (*)    All other components within normal limits  TROPONIN I (HIGH SENSITIVITY)  TROPONIN I (HIGH SENSITIVITY)    EKG EKG Interpretation  Date/Time:  Wednesday July 14 2021 20:19:16 EDT Ventricular Rate:  65 PR Interval:  237 QRS Duration: 91 QT  Interval:  398 QTC Calculation: 414 R Axis:   -43 Text Interpretation: Sinus or ectopic atrial rhythm Prolonged PR interval Abnormal R-wave progression, early transition Left ventricular hypertrophy Confirmed by Godfrey Pick  [phone]) on 07/14/2021 8:32:19 PM Also confirmed by Godfrey Pick [phone]), editor [place], [place] [phone])  on 07/15/2021 8:27:09 AM  Radiology CT HEAD WO CONTRAST (5MM)  Result Date: 07/14/2021 CLINICAL DATA:  Confusion EXAM: CT HEAD WITHOUT CONTRAST TECHNIQUE: Contiguous axial images were obtained from the base of the skull through the vertex without intravenous contrast. COMPARISON:  None. FINDINGS: Brain: There is no mass, hemorrhage or extra-axial collection. The size and configuration of the ventricles and extra-axial CSF spaces are normal. There is hypoattenuation of the white matter, most commonly indicating chronic small vessel disease. Old left posterior hemisphere infarct. Vascular: Atherosclerotic calcification of the internal carotid arteries at the skull base. No abnormal hyperdensity of the major intracranial arteries or dural venous sinuses. Skull: The visualized skull base, calvarium and extracranial soft tissues are normal. Sinuses/Orbits: No fluid levels or advanced mucosal thickening of the visualized paranasal sinuses. No mastoid or middle ear effusion. The orbits are normal. IMPRESSION: 1. No acute intracranial abnormality. 2. Old left posterior hemisphere infarct and findings of chronic small vessel disease. Electronically Signed   By: Ulyses Jarred M.D.   On: 07/14/2021 20:36   DG Chest Portable 1 View  Result Date: 07/14/2021 CLINICAL DATA:  Hypertension EXAM: PORTABLE CHEST 1 VIEW COMPARISON:  04/15/2018 FINDINGS: Lungs are clear.  No pleural effusion or pneumothorax. Cardiomegaly.  Thoracic aortic atherosclerosis. Right shoulder arthroplasty. Degenerative changes of the left shoulder. IMPRESSION: No evidence of acute cardiopulmonary disease. Electronically Signed   By: Julian Hy M.D.   On: 07/14/2021 20:35    Procedures Procedures   Medications Ordered in ED Medications  carvedilol (COREG) tablet 3.125 mg (3.125 mg Oral Given 07/14/21 2048)    ED Course  I have reviewed  the triage vital signs and the nursing notes.  Pertinent labs & imaging results that were available during my care of the patient were reviewed by me and considered in my medical decision making (see chart for details).    MDM Rules/Calculators/A&P                           Patient is a 87 year old female presenting for asymptomatic hypertension.  Baseline hypertension is in the range of 180s over 80s.  On arrival in the ED, patient's SBP is in the range of 220.  She has taken her blood pressure medications earlier today.  Vital signs are otherwise unremarkable.  On exam, patient is well-appearing.  She is alert and oriented, despite documented history of dementia.  She has no focal neurologic deficits.  Lungs are clear to auscultation.  She does have a cardiac murmur which she states she has had for years.  Additional dose of Coreg was given in the ED.  Work-up was initiated to assess for endorgan damage.  Results of work-up were reassuring.  While in the ED, patient's blood pressure came down to the range of 180s over 80s, consistent with her baseline.  She continued to deny any symptoms.  During her stay in the ED, patient's daughter did go home to get some of her medications.  During this time, I was able to speak to the patient one-on-one.  Patient has lived a long life with lots of ups and downs.  She has suffered the loss of some  of her children.  She has been estranged from her other daughter.  The daughter that she lives with, that is her current legal guardian, she has been at odds with for years.  She denies any physical abuse.  She does state that she would like to go live in a nursing facility.  TOC consult was placed for follow-up to help facilitate this.  Patient was discharged in stable condition.  Final Clinical Impression(s) / ED Diagnoses Final diagnoses:  Hypertension, unspecified type    Rx / DC Orders ED Discharge Orders     None        Godfrey Pick, MD 07/15/21 1346

## 2021-07-14 NOTE — ED Triage Notes (Signed)
Per daughter bp was elevated when nurse came today.  Denies any symptoms.

## 2021-07-14 NOTE — Discharge Instructions (Signed)
Discuss possible changes to her blood pressure medications with your primary care doctor.

## 2021-07-14 NOTE — ED Notes (Signed)
Patient transported to CT 

## 2021-07-15 NOTE — Telephone Encounter (Signed)
3 attempts have been made to return call with no answer and no VM.  Encounter closed.  Charyl Bigger, CMA

## 2021-07-15 NOTE — Telephone Encounter (Signed)
Tried to call [place] with [facility] and no answer, no voicemail. (3rd attempt) Unable to contact her.

## 2021-07-16 ENCOUNTER — Telehealth: Payer: Self-pay | Admitting: General Practice

## 2021-07-16 NOTE — Telephone Encounter (Signed)
Transition Care Management Follow-up Telephone Call Date of discharge and from where: 07/14/21 from [facility] How have you been since you were released from the hospital? Doing better. Any questions or concerns? No  Items Reviewed: Did the pt receive and understand the discharge instructions provided? Yes  Medications obtained and verified? Yes  Other? No  Any new allergies since your discharge? No  Dietary orders reviewed? Yes Do you have support at home? Yes   Home Care and Equipment/Supplies: Were home health services ordered? no  Functional Questionnaire: (I = Independent and D = Dependent) ADLs: I  Bathing/Dressing- I  Meal Prep- I  Eating- I  Maintaining continence- I  Transferring/Ambulation- I  Managing Meds- D  Follow up appointments reviewed:  PCP Hospital f/u appt confirmed? Yes  Scheduled to see Dr. Sheppard Coil on 07/21/21 @ 1110. [place] [facility] f/u appt confirmed? No   Are transportation arrangements needed? No  If their condition worsens, is the pt aware to call PCP or go to the Emergency Dept.? Yes Was the patient provided with contact information for the PCP's office or ED? Yes Was to pt encouraged to call back with questions or concerns? Yes

## 2021-07-19 NOTE — Telephone Encounter (Signed)
Task was completed on 07/19/21. New forms was faxed to [facility] at [phone]. Confirmation received.

## 2021-07-21 ENCOUNTER — Encounter: Payer: Self-pay | Admitting: Osteopathic Medicine

## 2021-07-21 ENCOUNTER — Ambulatory Visit (INDEPENDENT_AMBULATORY_CARE_PROVIDER_SITE_OTHER): Payer: Medicare Other | Admitting: Osteopathic Medicine

## 2021-07-21 ENCOUNTER — Telehealth: Payer: Self-pay

## 2021-07-21 VITALS — BP 127/73 | HR 70 | Temp 98.5°F | Ht 63.0 in | Wt 129.0 lb

## 2021-07-21 DIAGNOSIS — I10 Essential (primary) hypertension: Secondary | ICD-10-CM | POA: Diagnosis not present

## 2021-07-21 DIAGNOSIS — E871 Hypo-osmolality and hyponatremia: Secondary | ICD-10-CM | POA: Diagnosis not present

## 2021-07-21 DIAGNOSIS — Z23 Encounter for immunization: Secondary | ICD-10-CM | POA: Diagnosis not present

## 2021-07-21 DIAGNOSIS — Z111 Encounter for screening for respiratory tuberculosis: Secondary | ICD-10-CM | POA: Diagnosis not present

## 2021-07-21 MED ORDER — ZOSTER VAC RECOMB ADJUVANTED 50 MCG/0.5ML IM SUSR: 0.5000 mL | Freq: Once | INTRAMUSCULAR | 1 refills | Status: AC

## 2021-07-21 MED ORDER — AMBULATORY NON FORMULARY MEDICATION: 1 refills | Status: AC

## 2021-07-21 NOTE — Patient Instructions (Addendum)
Influenza (Flu) Vaccine (Inactivated or Recombinant): What You Need to Know 1. Why get vaccinated? Influenza vaccine can prevent influenza (flu). Flu is a contagious disease that spreads around the [place] every year, usually between October and May. Anyone can get the flu, but it is more dangerous for some people. Infants and young children, people 10 years and older, pregnant people, and people with certain health conditions or a weakened immune system are at greatest risk of flu complications. Pneumonia, bronchitis, sinus infections, and ear infections are examples of flu-related complications. If you have a medical condition, such as heart disease, cancer, or diabetes, flu can make it worse. Flu can cause fever and chills, sore throat, muscle aches, fatigue, cough, headache, and runny or stuffy nose. Some people may have vomiting and diarrhea, though this is more common in children than adults. In an average year, thousands of people in the [place] die from flu, and many more are hospitalized. Flu vaccine prevents millions of illnesses and flu-related visits to the doctor each year. 2. Influenza vaccines CDC recommends everyone 6 months and older get vaccinated every flu season. Children 6 months through 61 years of age may need 2 doses during a single flu season. Everyone else needs only 1 dose each flu season. It takes about 2 weeks for protection to develop after vaccination. There are many flu viruses, and they are always changing. Each year a new flu vaccine is made to protect against the influenza viruses believed to be likely to cause disease in the upcoming flu season. Even when the vaccine doesn't exactly match these viruses, it may still provide some protection. Influenza vaccine does not cause flu. Influenza vaccine may be given at the same time as other vaccines. 3. Talk with your health care provider Tell your vaccination provider if the person getting the vaccine: Has had  an allergic reaction after a previous dose of influenza vaccine, or has any severe, life-threatening allergies Has ever had Guillain-Barr Syndrome (also called "GBS") In some cases, your health care provider may decide to postpone influenza vaccination until a future visit. Influenza vaccine can be administered at any time during pregnancy. People who are or will be pregnant during influenza season should receive inactivated influenza vaccine. People with minor illnesses, such as a cold, may be vaccinated. People who are moderately or severely ill should usually wait until they recover before getting influenza vaccine. Your health care provider can give you more information. 4. Risks of a vaccine reaction Soreness, redness, and swelling where the shot is given, fever, muscle aches, and headache can happen after influenza vaccination. There may be a very small increased risk of Guillain-Barr Syndrome (GBS) after inactivated influenza vaccine (the flu shot). Young children who get the flu shot along with pneumococcal vaccine (PCV13) and/or DTaP vaccine at the same time might be slightly more likely to have a seizure caused by fever. Tell your health care provider if a child who is getting flu vaccine has ever had a seizure. People sometimes faint after medical procedures, including vaccination. Tell your provider if you feel dizzy or have vision changes or ringing in the ears. As with any medicine, there is a very remote chance of a vaccine causing a severe allergic reaction, other serious injury, or death. 5. What if there is a serious problem? An allergic reaction could occur after the vaccinated person leaves the clinic. If you see signs of a severe allergic reaction (hives, swelling of the face and throat, difficulty breathing,  a fast heartbeat, dizziness, or weakness), call 9-1-1 and get the person to the nearest hospital. For other signs that concern you, call your health care provider. Adverse  reactions should be reported to the Vaccine Adverse Event Reporting System (VAERS). Your health care provider will usually file this report, or you can do it yourself. Visit the VAERS website at www.vaers.SamedayNews.es or call (904) 147-7781. VAERS is only for reporting reactions, and VAERS staff members do not give medical advice. 6. The National Vaccine Injury Compensation Program The [employer] Vaccine Injury Compensation Program (VICP) is a federal program that was created to compensate people who may have been injured by certain vaccines. Claims regarding alleged injury or death due to vaccination have a time limit for filing, which may be as short as two years. Visit the VICP website at GoldCloset.com.ee or call 918-378-9944 to learn about the program and about filing a claim. 7. How can I learn more? Ask your health care provider. Call your local or state health department. Visit the website of the Food and Drug Administration (FDA) for vaccine package inserts and additional information at TraderRating.uy. Contact the Centers for Disease Control and Prevention (CDC): Call 408-779-4553 (1-800-CDC-INFO) or Visit CDC's website at https://gibson.com/. Vaccine Information Statement Inactivated Influenza Vaccine (06/05/2020) This information is not intended to replace advice given to you by your health care provider. Make sure you discuss any questions you have with your health care provider. Document Revised: 07/23/2020 Document Reviewed: 07/23/2020 Elsevier Patient Education  [place].   Pneumococcal Polysaccharide Vaccine (PPSV23): What You Need to Know 1. Why get vaccinated? Pneumococcal polysaccharide vaccine (PPSV23) can prevent pneumococcal disease. Pneumococcal disease refers to any illness caused by pneumococcal bacteria. These bacteria can cause many types of illnesses, including pneumonia, which is an infection of the lungs. Pneumococcal  bacteria are one of the most common causes of pneumonia. Besides pneumonia, pneumococcal bacteria can also cause: Ear infections Sinus infections Meningitis (infection of the tissue covering the brain and spinal cord) Bacteremia (bloodstream infection) Anyone can get pneumococcal disease, but children under 66 years of age, people with certain medical conditions, adults 11 years or older, and cigarette smokers are at the highest risk. Most pneumococcal infections are mild. However, some can result in long-term problems, such as brain damage or hearing loss. Meningitis, bacteremia, and pneumonia caused by pneumococcal disease can be fatal. 2. PPSV23 PPSV23 protects against 23 types of bacteria that cause pneumococcal disease. PPSV23 is recommended for: All adults 45 years or older, Anyone 2 years or older with certain medical conditions that can lead to an increased risk for pneumococcal disease. Most people need only one dose of PPSV23. A second dose of PPSV23, and another type of pneumococcal vaccine called PCV13, are recommended for certain high-risk groups. Your health care provider can give you more information. People 65 years or older should get a dose of PPSV23 even if they have already gotten one or more doses of the vaccine before they turned 97. 3. Talk with your health care provider Tell your vaccine provider if the person getting the vaccine: Has had an allergic reaction after a previous dose of PPSV23, or has any severe, life-threatening allergies. In some cases, your health care provider may decide to postpone PPSV23 vaccination to a future visit. People with minor illnesses, such as a cold, may be vaccinated. People who are moderately or severely ill should usually wait until they recover before getting PPSV23. Your health care provider can give you more information. 4. Risks  of a vaccine reaction Redness or pain where the shot is given, feeling tired, fever, or muscle aches can  happen after PPSV23. People sometimes faint after medical procedures, including vaccination. Tell your provider if you feel dizzy or have vision changes or ringing in the ears. As with any medicine, there is a very remote chance of a vaccine causing a severe allergic reaction, other serious injury, or death. 5. What if there is a serious problem? An allergic reaction could occur after the vaccinated person leaves the clinic. If you see signs of a severe allergic reaction (hives, swelling of the face and throat, difficulty breathing, a fast heartbeat, dizziness, or weakness), call 9-1-1 and get the person to the nearest hospital. For other signs that concern you, call your health care provider. Adverse reactions should be reported to the Vaccine Adverse Event Reporting System (VAERS). Your health care provider will usually file this report, or you can do it yourself. Visit the VAERS website at www.vaers.SamedayNews.es or call 979-623-4817. VAERS is only for reporting reactions, and VAERS staff do not give medical advice. 6. How can I learn more? Ask your health care provider. Call your local or state health department. Contact the Centers for Disease Control and Prevention (CDC): Call 978-247-7660 (1-800-CDC-INFO) or Visit CDC's website at http://hunter.com/ Vaccine Information Statement PPSV23 Vaccine (08/29/2018) This information is not intended to replace advice given to you by your health care provider. Make sure you discuss any questions you have with your health care provider. Document Revised: 06/19/2020 Document Reviewed: 06/19/2020 Elsevier Patient Education  [place].

## 2021-07-21 NOTE — Progress Notes (Signed)
Bethany Park is a 87 y.o. female who presents to  [place] at [facility]  today, 07/21/21, seeking care for the following:  [place] needs some measures before then Brought today by daughter, no concerns  Flu high dose --> done in office Shingrix needed --> pharmacy  COVID vaccine needed --> pharmacy  Pneumonia vaccine needed --> Prevnar 20 done in office TB screening --> quantiferon ordered    ASSESSMENT & PLAN with other pertinent findings:  The primary encounter diagnosis was Essential hypertension. Diagnoses of Hyponatremia, Screening-pulmonary TB, Need for influenza vaccination, Need for shingles vaccine, Need for pneumococcal vaccination, and Need for COVID-19 vaccine were also pertinent to this visit.    Patient Instructions  Influenza (Flu) Vaccine (Inactivated or Recombinant): What You Need to Know 1. Why get vaccinated? Influenza vaccine can prevent influenza (flu). Flu is a contagious disease that spreads around the [place] every year, usually between October and May. Anyone can get the flu, but it is more dangerous for some people. Infants and young children, people 50 years and older, pregnant people, and people with certain health conditions or a weakened immune system are at greatest risk of flu complications. Pneumonia, bronchitis, sinus infections, and ear infections are examples of flu-related complications. If you have a medical condition, such as heart disease, cancer, or diabetes, flu can make it worse. Flu can cause fever and chills, sore throat, muscle aches, fatigue, cough, headache, and runny or stuffy nose. Some people may have vomiting and diarrhea, though this is more common in children than adults. In an average year, thousands of people in the [place] die from flu, and many more are hospitalized. Flu vaccine prevents millions of illnesses and flu-related visits to the doctor each  year. 2. Influenza vaccines CDC recommends everyone 6 months and older get vaccinated every flu season. Children 6 months through 4 years of age may need 2 doses during a single flu season. Everyone else needs only 1 dose each flu season. It takes about 2 weeks for protection to develop after vaccination. There are many flu viruses, and they are always changing. Each year a new flu vaccine is made to protect against the influenza viruses believed to be likely to cause disease in the upcoming flu season. Even when the vaccine doesn't exactly match these viruses, it may still provide some protection. Influenza vaccine does not cause flu. Influenza vaccine may be given at the same time as other vaccines. 3. Talk with your health care provider Tell your vaccination provider if the person getting the vaccine: Has had an allergic reaction after a previous dose of influenza vaccine, or has any severe, life-threatening allergies Has ever had Guillain-Barr Syndrome (also called "GBS") In some cases, your health care provider may decide to postpone influenza vaccination until a future visit. Influenza vaccine can be administered at any time during pregnancy. People who are or will be pregnant during influenza season should receive inactivated influenza vaccine. People with minor illnesses, such as a cold, may be vaccinated. People who are moderately or severely ill should usually wait until they recover before getting influenza vaccine. Your health care provider can give you more information. 4. Risks of a vaccine reaction Soreness, redness, and swelling where the shot is given, fever, muscle aches, and headache can happen after influenza vaccination. There may be a very small increased risk of Guillain-Barr Syndrome (GBS) after inactivated influenza vaccine (the flu shot). Young children  who get the flu shot along with pneumococcal vaccine (PCV13) and/or DTaP vaccine at the same time might be slightly  more likely to have a seizure caused by fever. Tell your health care provider if a child who is getting flu vaccine has ever had a seizure. People sometimes faint after medical procedures, including vaccination. Tell your provider if you feel dizzy or have vision changes or ringing in the ears. As with any medicine, there is a very remote chance of a vaccine causing a severe allergic reaction, other serious injury, or death. 5. What if there is a serious problem? An allergic reaction could occur after the vaccinated person leaves the clinic. If you see signs of a severe allergic reaction (hives, swelling of the face and throat, difficulty breathing, a fast heartbeat, dizziness, or weakness), call 9-1-1 and get the person to the nearest hospital. For other signs that concern you, call your health care provider. Adverse reactions should be reported to the Vaccine Adverse Event Reporting System (VAERS). Your health care provider will usually file this report, or you can do it yourself. Visit the VAERS website at www.vaers.SamedayNews.es or call 4303431304. VAERS is only for reporting reactions, and VAERS staff members do not give medical advice. 6. The National Vaccine Injury Compensation Program The [employer] Vaccine Injury Compensation Program (VICP) is a federal program that was created to compensate people who may have been injured by certain vaccines. Claims regarding alleged injury or death due to vaccination have a time limit for filing, which may be as short as two years. Visit the VICP website at GoldCloset.com.ee or call 386-722-0427 to learn about the program and about filing a claim. 7. How can I learn more? Ask your health care provider. Call your local or state health department. Visit the website of the Food and Drug Administration (FDA) for vaccine package inserts and additional information at TraderRating.uy. Contact the Centers for Disease  Control and Prevention (CDC): Call (918) 741-4979 (1-800-CDC-INFO) or Visit CDC's website at https://gibson.com/. Vaccine Information Statement Inactivated Influenza Vaccine (06/05/2020) This information is not intended to replace advice given to you by your health care provider. Make sure you discuss any questions you have with your health care provider. Document Revised: 07/23/2020 Document Reviewed: 07/23/2020 Elsevier Patient Education  [place].   Pneumococcal Polysaccharide Vaccine (PPSV23): What You Need to Know 1. Why get vaccinated? Pneumococcal polysaccharide vaccine (PPSV23) can prevent pneumococcal disease. Pneumococcal disease refers to any illness caused by pneumococcal bacteria. These bacteria can cause many types of illnesses, including pneumonia, which is an infection of the lungs. Pneumococcal bacteria are one of the most common causes of pneumonia. Besides pneumonia, pneumococcal bacteria can also cause: Ear infections Sinus infections Meningitis (infection of the tissue covering the brain and spinal cord) Bacteremia (bloodstream infection) Anyone can get pneumococcal disease, but children under 14 years of age, people with certain medical conditions, adults 10 years or older, and cigarette smokers are at the highest risk. Most pneumococcal infections are mild. However, some can result in long-term problems, such as brain damage or hearing loss. Meningitis, bacteremia, and pneumonia caused by pneumococcal disease can be fatal. 2. PPSV23 PPSV23 protects against 23 types of bacteria that cause pneumococcal disease. PPSV23 is recommended for: All adults 75 years or older, Anyone 2 years or older with certain medical conditions that can lead to an increased risk for pneumococcal disease. Most people need only one dose of PPSV23. A second dose of PPSV23, and another type of pneumococcal vaccine called PCV13,  are recommended for certain high-risk groups. Your health care  provider can give you more information. People 65 years or older should get a dose of PPSV23 even if they have already gotten one or more doses of the vaccine before they turned 44. 3. Talk with your health care provider Tell your vaccine provider if the person getting the vaccine: Has had an allergic reaction after a previous dose of PPSV23, or has any severe, life-threatening allergies. In some cases, your health care provider may decide to postpone PPSV23 vaccination to a future visit. People with minor illnesses, such as a cold, may be vaccinated. People who are moderately or severely ill should usually wait until they recover before getting PPSV23. Your health care provider can give you more information. 4. Risks of a vaccine reaction Redness or pain where the shot is given, feeling tired, fever, or muscle aches can happen after PPSV23. People sometimes faint after medical procedures, including vaccination. Tell your provider if you feel dizzy or have vision changes or ringing in the ears. As with any medicine, there is a very remote chance of a vaccine causing a severe allergic reaction, other serious injury, or death. 5. What if there is a serious problem? An allergic reaction could occur after the vaccinated person leaves the clinic. If you see signs of a severe allergic reaction (hives, swelling of the face and throat, difficulty breathing, a fast heartbeat, dizziness, or weakness), call 9-1-1 and get the person to the nearest hospital. For other signs that concern you, call your health care provider. Adverse reactions should be reported to the Vaccine Adverse Event Reporting System (VAERS). Your health care provider will usually file this report, or you can do it yourself. Visit the VAERS website at www.vaers.SamedayNews.es or call 239-541-9215. VAERS is only for reporting reactions, and VAERS staff do not give medical advice. 6. How can I learn more? Ask your health care provider. Call your  local or state health department. Contact the Centers for Disease Control and Prevention (CDC): Call (907)609-6134 (1-800-CDC-INFO) or Visit CDC's website at http://hunter.com/ Vaccine Information Statement PPSV23 Vaccine (08/29/2018) This information is not intended to replace advice given to you by your health care provider. Make sure you discuss any questions you have with your health care provider. Document Revised: 06/19/2020 Document Reviewed: 06/19/2020 Elsevier Patient Education  [place].  Orders Placed This Encounter  Procedures   Flu Vaccine QUAD High Dose(Fluad)   Pneumococcal conjugate vaccine 20-valent   QuantiFERON-TB Gold Plus   COMPLETE METABOLIC PANEL WITH GFR    Meds ordered this encounter  Medications   Zoster Vaccine Adjuvanted [facility]) injection    Sig: Inject 0.5 mLs into the muscle once for 1 dose. Repeat in 2-6 months. Please fax confirmation of vaccination to Dr Sheppard Coil [phone]    Dispense:  0.5 mL    Refill:  1   AMBULATORY NON FORMULARY MEDICATION    Sig: COVID vaccine - initial series, Moderna or [place] no preference    Dispense:  1 Units    Refill:  1     See below for relevant physical exam findings  See below for recent lab and imaging results reviewed  Medications, allergies, PMH, PSH, SocH, [place] reviewed below    Follow-up instructions: Return for check up as needed, otherwise estbalish w/ facility physician or other (I will be levaing practice).  Exam:  BP 127/73   Pulse 70   Temp 98.5 F (36.9 C)   Ht 5' 3"  (1.6 m)   Wt 129 lb (58.5 kg)   SpO2 98%   BMI 22.85 kg/m  Constitutional: VS see above. General Appearance: alert, well-developed, well-nourished, NAD Neck: No masses, trachea midline.  Respiratory: Normal respiratory effort. no wheeze, no rhonchi, no rales Cardiovascular: S1/S2 normal, +4/0 systolic murmur, no rub/gallop  auscultated. RRR.  Musculoskeletal: Gait normal. Neurological: Normal balance/coordination. No tremor. Skin: warm, dry, intact.  Psychiatric: Fair judgment/insight. Normal mood and affect. Oriented x3.   Current Meds  Medication Sig   albuterol (PROVENTIL HFA;VENTOLIN HFA) 108 (90 Base) MCG/ACT inhaler Inhale 2 puffs into the lungs every 6 (six) hours as needed for wheezing.   [facility] bed with rails   AMBULATORY NON FORMULARY MEDICATION Wheelchair   AMBULATORY NON FORMULARY MEDICATION Physical therapy eval and treat for dx: ambulatory dysfunction, charcot marie tooth syndrome, frequent falls. Fax attn: Manson Passey [phone]   AMBULATORY NON FORMULARY MEDICATION Medication Name: handicap lift for shower/bathtub Fax to [facility] in [place] Medication Name: Mepilex 4 inch x 4 inch or larger sacral foam pad or can substitute similar to place on sacral region and change every 1-2 days   AMBULATORY NON FORMULARY MEDICATION COVID vaccine - initial series, Moderna or Pfizer no preference   atorvastatin (LIPITOR) 40 MG tablet Take 1 tablet (40 mg total) by mouth daily.   calcium carbonate (OS-CAL) 1250 (500 Ca) MG chewable tablet Chew 1 tablet by mouth as needed for heartburn.   carvedilol (COREG) 3.125 MG tablet Take 1 tablet (3.125 mg total) by mouth 2 (two) times daily with a meal.   Cholecalciferol 25 MCG (1000 UT) tablet Take 1 tablet by mouth daily.   [employer] OMEPRAZOLE 20 MG TBEC Take 1 tablet by mouth daily.   cyanocobalamin 1000 MCG tablet Take by mouth daily.   diphenoxylate-atropine (LOMOTIL) 2.5-0.025 MG tablet Take 1 tablet by mouth 4 (four) times daily as needed for diarrhea or loose stools.   famotidine (PEPCID) 40 MG tablet Take 1 tablet (40 mg total) by mouth daily.   furosemide (LASIX) 20 MG tablet TAKE 1 TABLET BY MOUTH TWICE A DAY WITH BREAKFAST AND WITH LUNCH   gabapentin (NEURONTIN) 300 MG  capsule Take 3 capsules (900 mg total) by mouth at bedtime.   ipratropium-albuterol (DUONEB) 0.5-2.5 (3) MG/3ML SOLN Inhale into the lungs every 4 (four) hours as needed.   lisinopril (ZESTRIL) 2.5 MG tablet Take 1 tablet (2.5 mg total) by mouth daily.   LORazepam (ATIVAN) 0.5 MG tablet TAKE ONE-HALF (1/2) TO ONE TABLET AT BEDTIME AS NEEDED FOR ANXIETY OR SLEEP   Magnesium 400 MG CAPS Take by mouth daily.   Melatonin 10 MG SUBL Place 10 mg under the tongue at bedtime.   Multiple Vitamins-Minerals (PRESERVISION AREDS) CAPS Take by mouth daily.   nitroGLYCERIN (NITROSTAT) 0.4 MG SL tablet Place 1 tablet (0.4 mg total) under the tongue every 5 (five) minutes as needed for chest pain. If no better 10-15 mins, go to hospital   nystatin (MYCOSTATIN/NYSTOP) powder Apply topically 4 (four) times daily.   ondansetron (ZOFRAN-ODT) 8 MG disintegrating tablet Take 1 tablet (8 mg total) by mouth every 8 (eight) hours as needed for nausea.   oxyCODONE-acetaminophen (PERCOCET) 10-325 MG tablet Take 1 tablet by mouth every 8 (eight) hours as needed for pain.   psyllium (REGULOID) 0.52 g capsule  Take 0.52 g by mouth daily.   rOPINIRole (REQUIP) 3 MG tablet Take 1 tablet (3 mg total) by mouth 3 (three) times daily.   sodium chloride 1 g tablet Take 1 g by mouth 3 (three) times daily.   Zoster Vaccine Adjuvanted [facility]) injection Inject 0.5 mLs into the muscle once for 1 dose. Repeat in 2-6 months. Please fax confirmation of vaccination to Dr Sheppard Coil [phone]    Allergies  Allergen Reactions   Alendronate Sodium Other (See Comments)   Benztropine Other (See Comments)   Citalopram Other (See Comments)   Duloxetine Hcl Other (See Comments)   Erythromycin Other (See Comments)   Fluoxetine Hcl Other (See Comments)   Glimepiride Other (See Comments)   Nortriptyline Hcl Other (See Comments)   Latex Other (See Comments)    Per daughter    Penicillins Other (See Comments)   Quetiapine Fumarate Other  (See Comments)    As per daughter, hallucinations.     Patient Active Problem List   Diagnosis Date Noted   Primary osteoarthritis of left shoulder 07/22/2020   Lumbar spinal stenosis 06/18/2020   Vertigo 03/17/2020   Laceration of scalp 12/24/2019   Metatarsalgia of both feet 12/24/2019   Subarachnoid hemorrhage ([place]) 11/11/2018   Laceration of right forearm 05/11/2018   Sundowning 05/11/2018   Chronic low back pain 04/26/2018   Chronic right hip pain 04/26/2018   Melena    UGIB (upper gastrointestinal bleed) 04/15/2018   Anemia, blood loss 04/15/2018   Depression 04/15/2018   CAD (coronary artery disease) 04/15/2018   Fall 04/15/2018   Hyponatremia 04/15/2018   Acute renal failure superimposed on stage 3 chronic kidney disease ([place]) 04/15/2018   UTI (urinary tract infection) 04/15/2018   Hypotension    Ambulatory dysfunction 04/03/2018   Generalized abdominal pain 03/28/2018   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/13/2018   Diabetic macular edema ([place]) 03/13/2018   Mild nonproliferative diabetic retinopathy ([place]) 03/13/2018   Atrial fibrillation ([place]) 03/13/2018   Essential hypertension 03/13/2018   History of coronary artery stent placement 03/13/2018   ASCVD (arteriosclerotic cardiovascular disease) 03/13/2018   Moderate aortic stenosis 03/13/2018   Dementia ([place]) 03/13/2018   HLD (hyperlipidemia) 03/13/2018   B12 deficiency 03/13/2018   Osteoporosis 03/13/2018   Mild sleep apnea 03/13/2018   Colon polyp 03/13/2018   Restless leg syndrome 03/13/2018   Macular degeneration 03/13/2018   Anemia 03/13/2018   Depression with anxiety 03/13/2018   Overactive bladder 03/13/2018   History of CVA (cerebrovascular accident) 03/13/2018   Glucose intolerance 03/13/2018   Crohn disease ([place]) 03/13/2018    Family History  Problem Relation Age of Onset   Heart disease Mother    Diabetes Mellitus II Mother    Stroke Mother    Colon cancer Father      Social History   Tobacco Use  Smoking Status Never  Smokeless Tobacco Never    Past Surgical History:  Procedure Laterality Date   ABDOMINAL HYSTERECTOMY     BACK SURGERY     BREAST SURGERY     CHOLECYSTECTOMY     CORONARY STENT PLACEMENT     ESOPHAGOGASTRODUODENOSCOPY N/A 04/16/2018   Procedure: ESOPHAGOGASTRODUODENOSCOPY (EGD);  Surgeon: Ladene Artist, MD;  Location: [facility] ENDOSCOPY;  Service: Endoscopy;  Laterality: N/A;   EYE SURGERY     HEMORROIDECTOMY     HOT HEMOSTASIS N/A 04/16/2018   Procedure: HOT HEMOSTASIS (ARGON PLASMA COAGULATION/BICAP);  Surgeon: Ladene Artist, MD;  Location: [facility] ENDOSCOPY;  Service: Endoscopy;  Laterality: N/A;   JOINT REPLACEMENT      Immunization History  Administered Date(s) Administered   Fluad Quad(high Dose 65+) 07/21/2021   Influenza, High Dose Seasonal PF 09/17/2018   PNEUMOCOCCAL CONJUGATE-20 07/21/2021   Tdap 12/19/2019    Recent Results (from the past 2160 hour(s))  Urinalysis, Routine w reflex microscopic Urine, Clean Catch     Status: Abnormal   Collection Time: 07/14/21  7:52 PM  Result Value Ref Range   Color, Urine YELLOW YELLOW   APPearance CLEAR CLEAR   Specific Gravity, Urine 1.020 1.005 - 1.030   pH 7.0 5.0 - 8.0   Glucose, UA NEGATIVE NEGATIVE mg/dL   Hgb urine dipstick NEGATIVE NEGATIVE   Bilirubin Urine NEGATIVE NEGATIVE   Ketones, ur NEGATIVE NEGATIVE mg/dL   Protein, ur 30 (A) NEGATIVE mg/dL   Nitrite NEGATIVE NEGATIVE   Leukocytes,Ua NEGATIVE NEGATIVE    Comment: Performed at [facility], [address] [place], [place] [address]  Urinalysis, Microscopic (reflex)     Status: Abnormal   Collection Time: 07/14/21  7:52 PM  Result Value Ref Range   RBC / HPF 0-5 0 - 5 RBC/hpf   WBC, UA NONE SEEN 0 - 5 WBC/hpf   Bacteria, UA RARE (A) NONE SEEN   Squamous Epithelial / LPF 0-5 0 - 5    Comment: Performed at [facility], [address] [place], [place] [address]  Comprehensive  metabolic panel     Status: Abnormal   Collection Time: 07/14/21  8:10 PM  Result Value Ref Range   Sodium 127 (L) 135 - 145 mmol/L   Potassium 4.1 3.5 - 5.1 mmol/L   Chloride 90 (L) 98 - 111 mmol/L   CO2 27 22 - 32 mmol/L   Glucose, Bld 132 (H) 70 - 99 mg/dL    Comment: Glucose reference range applies only to samples taken after fasting for at least 8 hours.   BUN 21 8 - 23 mg/dL   Creatinine, Ser 0.89 0.44 - 1.00 mg/dL   Calcium 9.4 8.9 - 10.3 mg/dL   Total Protein 7.9 6.5 - 8.1 g/dL   Albumin 4.2 3.5 - 5.0 g/dL   AST 28 15 - 41 U/L   ALT 16 0 - 44 U/L   Alkaline Phosphatase 76 38 - 126 U/L   Total Bilirubin 0.6 0.3 - 1.2 mg/dL   GFR, Estimated >60 >60 mL/min    Comment: (NOTE) Calculated using the CKD-EPI Creatinine Equation (2021)    Anion gap 10 5 - 15    Comment: Performed at [facility], [address] [place], [place] [address]  CBC with Differential     Status: Abnormal   Collection Time: 07/14/21  8:10 PM  Result Value Ref Range   WBC 8.2 4.0 - 10.5 K/uL   RBC 3.44 (L) 3.87 - 5.11 MIL/uL   Hemoglobin 11.3 (L) 12.0 - 15.0 g/dL   HCT 32.3 (L) 36.0 - 46.0 %   MCV 93.9 80.0 - 100.0 fL   MCH 32.8 26.0 - 34.0 pg   MCHC 35.0 30.0 - 36.0 g/dL   RDW 13.6 11.5 - 15.5 %   Platelets 281 150 - 400 K/uL   nRBC 0.0 0.0 - 0.2 %   Neutrophils Relative % 77 %   Neutro Abs 6.2 1.7 - 7.7 K/uL   Lymphocytes Relative 14 %   Lymphs Abs 1.1 0.7 - 4.0 K/uL   Monocytes Relative 8 %  Monocytes Absolute 0.7 0.1 - 1.0 K/uL   Eosinophils Relative 1 %   Eosinophils Absolute 0.1 0.0 - 0.5 K/uL   Basophils Relative 0 %   Basophils Absolute 0.0 0.0 - 0.1 K/uL   Immature Granulocytes 0 %   Abs Immature Granulocytes 0.03 0.00 - 0.07 K/uL    Comment: Performed at [facility], [address] [place], [place] [address]  Troponin I (High Sensitivity)     Status: None   Collection Time: 07/14/21  8:10 PM  Result Value Ref Range   Troponin I (High Sensitivity) 11 <18  ng/L    Comment: (NOTE) Elevated high sensitivity troponin I (hsTnI) values and significant  changes across serial measurements may suggest ACS but many other  chronic and acute conditions are known to elevate hsTnI results.  Refer to the "Links" section for chest pain algorithms and additional  guidance. Performed at [facility], [address] [place], [place] [address]   Troponin I (High Sensitivity)     Status: None   Collection Time: 07/14/21 10:56 PM  Result Value Ref Range   Troponin I (High Sensitivity) 11 <18 ng/L    Comment: (NOTE) Elevated high sensitivity troponin I (hsTnI) values and significant  changes across serial measurements may suggest ACS but many other  chronic and acute conditions are known to elevate hsTnI results.  Refer to the "Links" section for chest pain algorithms and additional  guidance. Performed at [facility], [address] [place], [place] [address]     No results found.     All questions at time of visit were answered - patient instructed to contact office with any additional concerns or updates. ER/RTC precautions were reviewed with the patient as applicable.   Please note: manual typing as well as voice recognition software may have been used to produce this document - typos may escape review. Please contact Dr. Sheppard Coil for any needed clarifications.   Total encounter time on date of service, 07/21/21, was 30 minutes spent addressing problems/issues as noted above in [place], including time spent in discussion with patient regarding the HPI, ROS, confirming history, reviewing Assessment & Plan, as well as time spent on coordination of care, record review.

## 2021-07-21 NOTE — Telephone Encounter (Signed)
Nunzio Cory with [facility] called and said that they went out to see the pt today but she was not there because she was running late after a doctor's appt. They did speak with her daughter who said that they needed to discontinue [facility] services because pt will be going to a memory care unit this Friday. Nunzio Cory states they need verbal orders to discontinue the services.   Nunzio Cory: [phone]

## 2021-07-23 ENCOUNTER — Encounter (HOSPITAL_BASED_OUTPATIENT_CLINIC_OR_DEPARTMENT_OTHER): Payer: Self-pay

## 2021-07-23 ENCOUNTER — Emergency Department (HOSPITAL_BASED_OUTPATIENT_CLINIC_OR_DEPARTMENT_OTHER)
Admission: EM | Admit: 2021-07-23 | Discharge: 2021-07-23 | Disposition: A | Discharge status: Home / Self Care | Payer: Medicare Other | Attending: Emergency Medicine | Admitting: Emergency Medicine

## 2021-07-23 ENCOUNTER — Other Ambulatory Visit: Payer: Self-pay

## 2021-07-23 ENCOUNTER — Emergency Department (HOSPITAL_BASED_OUTPATIENT_CLINIC_OR_DEPARTMENT_OTHER): Payer: Medicare Other

## 2021-07-23 DIAGNOSIS — Z8543 Personal history of malignant neoplasm of ovary: Secondary | ICD-10-CM | POA: Insufficient documentation

## 2021-07-23 DIAGNOSIS — I119 Hypertensive heart disease without heart failure: Secondary | ICD-10-CM | POA: Insufficient documentation

## 2021-07-23 DIAGNOSIS — I2510 Atherosclerotic heart disease of native coronary artery without angina pectoris: Secondary | ICD-10-CM | POA: Insufficient documentation

## 2021-07-23 DIAGNOSIS — M7918 Myalgia, other site: Secondary | ICD-10-CM

## 2021-07-23 DIAGNOSIS — J449 Chronic obstructive pulmonary disease, unspecified: Secondary | ICD-10-CM | POA: Diagnosis not present

## 2021-07-23 DIAGNOSIS — M25552 Pain in left hip: Secondary | ICD-10-CM | POA: Diagnosis present

## 2021-07-23 DIAGNOSIS — W050XXA Fall from non-moving wheelchair, initial encounter: Secondary | ICD-10-CM | POA: Diagnosis not present

## 2021-07-23 DIAGNOSIS — M791 Myalgia, unspecified site: Secondary | ICD-10-CM | POA: Diagnosis not present

## 2021-07-23 DIAGNOSIS — M79652 Pain in left thigh: Secondary | ICD-10-CM | POA: Diagnosis not present

## 2021-07-23 DIAGNOSIS — Z7951 Long term (current) use of inhaled steroids: Secondary | ICD-10-CM | POA: Insufficient documentation

## 2021-07-23 DIAGNOSIS — F0390 Unspecified dementia without behavioral disturbance: Secondary | ICD-10-CM | POA: Diagnosis not present

## 2021-07-23 DIAGNOSIS — Z91040 Latex allergy status: Secondary | ICD-10-CM | POA: Insufficient documentation

## 2021-07-23 DIAGNOSIS — Z79899 Other long term (current) drug therapy: Secondary | ICD-10-CM | POA: Insufficient documentation

## 2021-07-23 MED ORDER — OXYCODONE-ACETAMINOPHEN 5-325 MG PO TABS
1.0000 | ORAL_TABLET | Freq: Once | ORAL | Status: AC
  Administered 2021-07-23: 1 via ORAL
  Filled 2021-07-23: qty 1

## 2021-07-23 NOTE — ED Provider Notes (Signed)
[facility] DEPT Provider Note   CSN: [id number] Arrival date & time: 07/23/21  1634     History Chief Complaint  Patient presents with   Hip Pain    Bethany Park is a 87 y.o. female.  Patient presents chief complaint of fall out of the wheelchair yesterday, complaining of pain in the left hip and buttock region.  She otherwise is wheelchair dependent but can transfer.  She is brought to the ER by her family member.  Otherwise denies headache or neck pain or loss of consciousness.  No reports of fevers or cough or vomiting or diarrhea.  She has a history of dementia and difficult to obtain complete review of systems.      Past Medical History:  Diagnosis Date   Anemia    Aortic stenosis    Atrial fibrillation ([place])    B12 deficiency 03/13/2018   Colon polyp 09/2013   in [place] state.  for personal hx polyps and fm hx colon ca.  found small transverse polyp, diverticulosis, int hemorrhoids.     Dementia ([place])    Depression with anxiety 03/13/2018   Essential hypertension 03/13/2018   Glucose intolerance 03/13/2018   Heart attack ([place])    Heart failure (HCC)    History of COPD    History of coronary artery stent placement 09/2017   Moderate aortic stenosis 03/13/2018   Osteoporosis 03/13/2018   Ovarian cancer ([place])    Restless leg syndrome 03/13/2018   Stroke [facility])     Patient Active Problem List   Diagnosis Date Noted   Primary osteoarthritis of left shoulder 07/22/2020   Lumbar spinal stenosis 06/18/2020   Vertigo 03/17/2020   Laceration of scalp 12/24/2019   Metatarsalgia of both feet 12/24/2019   Subarachnoid hemorrhage ([place]) 11/11/2018   Laceration of right forearm 05/11/2018   Sundowning 05/11/2018   Chronic low back pain 04/26/2018   Chronic right hip pain 04/26/2018   Melena    UGIB (upper gastrointestinal bleed) 04/15/2018   Anemia, blood loss 04/15/2018   Depression 04/15/2018   CAD (coronary artery disease) 04/15/2018   Fall  04/15/2018   Hyponatremia 04/15/2018   Acute renal failure superimposed on stage 3 chronic kidney disease ([place]) 04/15/2018   UTI (urinary tract infection) 04/15/2018   Hypotension    Ambulatory dysfunction 04/03/2018   Generalized abdominal pain 03/28/2018   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/13/2018   Diabetic macular edema ([place]) 03/13/2018   Mild nonproliferative diabetic retinopathy ([place]) 03/13/2018   Atrial fibrillation ([place]) 03/13/2018   Essential hypertension 03/13/2018   History of coronary artery stent placement 03/13/2018   ASCVD (arteriosclerotic cardiovascular disease) 03/13/2018   Moderate aortic stenosis 03/13/2018   Dementia ([employer]) 03/13/2018   HLD (hyperlipidemia) 03/13/2018   B12 deficiency 03/13/2018   Osteoporosis 03/13/2018   Mild sleep apnea 03/13/2018   Colon polyp 03/13/2018   Restless leg syndrome 03/13/2018   Macular degeneration 03/13/2018   Anemia 03/13/2018   Depression with anxiety 03/13/2018   Overactive bladder 03/13/2018   History of CVA (cerebrovascular accident) 03/13/2018   Glucose intolerance 03/13/2018   Crohn disease ([place]) 03/13/2018    Past Surgical History:  Procedure Laterality Date   ABDOMINAL HYSTERECTOMY     BACK SURGERY     BREAST SURGERY     CHOLECYSTECTOMY     CORONARY STENT PLACEMENT     ESOPHAGOGASTRODUODENOSCOPY N/A 04/16/2018   Procedure: ESOPHAGOGASTRODUODENOSCOPY (EGD);  Surgeon: Ladene Artist, MD;  Location: [facility] ENDOSCOPY;  Service: Endoscopy;  Laterality: N/A;   EYE SURGERY     HEMORROIDECTOMY     HOT HEMOSTASIS N/A 04/16/2018   Procedure: HOT HEMOSTASIS (ARGON PLASMA COAGULATION/BICAP);  Surgeon: Ladene Artist, MD;  Location: [facility] ENDOSCOPY;  Service: Endoscopy;  Laterality: N/A;   JOINT REPLACEMENT       OB History   No obstetric history on file.     Family History  Problem Relation Age of Onset   Heart disease Mother    Diabetes Mellitus II Mother    Stroke Mother    Colon  cancer Father     Social History   Tobacco Use   Smoking status: Never   Smokeless tobacco: Never  Vaping Use   Vaping Use: Never used  Substance Use Topics   Alcohol use: Not Currently   Drug use: Not Currently    Home Medications Prior to Admission medications   Medication Sig Start Date End Date Taking? Authorizing Provider  albuterol (PROVENTIL HFA;VENTOLIN HFA) 108 (90 Base) MCG/ACT inhaler Inhale 2 puffs into the lungs every 6 (six) hours as needed for wheezing. 06/07/18   Emeterio Reeve, DO  AMBULATORY NON [facility] bed with rails 04/26/18   Emeterio Reeve, DO  AMBULATORY NON [facility] MEDICATION Wheelchair 04/26/18   Emeterio Reeve, DO  AMBULATORY NON FORMULARY MEDICATION Physical therapy eval and treat for dx: ambulatory dysfunction, charcot marie tooth syndrome, frequent falls. Fax attnManson Passey [phone] 06/05/19   Emeterio Reeve, DO  AMBULATORY NON FORMULARY MEDICATION Medication Name: handicap lift for shower/bathtub Fax to [facility] in [place] 09/16/20   Emeterio Reeve, DO  AMBULATORY NON FORMULARY MEDICATION Medication Name: Mepilex 4 inch x 4 inch or larger sacral foam pad or can substitute similar to place on sacral region and change every 1-2 days 11/11/20   Emeterio Reeve, DO  AMBULATORY NON FORMULARY MEDICATION COVID vaccine - initial series, Moderna or [place] no preference 07/21/21   Emeterio Reeve, DO  atorvastatin (LIPITOR) 40 MG tablet Take 1 tablet (40 mg total) by mouth daily. 02/11/21   Emeterio Reeve, DO  calcium carbonate (OS-CAL) 1250 (500 Ca) MG chewable tablet Chew 1 tablet by mouth as needed for heartburn.    [provider]  carvedilol (COREG) 3.125 MG tablet Take 1 tablet (3.125 mg total) by mouth 2 (two) times daily with a meal. 02/11/21   Emeterio Reeve, DO  Cholecalciferol 25 MCG (1000 UT) tablet Take 1 tablet by mouth daily. 06/25/19   [provider]  [facility]  OMEPRAZOLE 20 MG TBEC Take 1 tablet by mouth daily. 05/24/21   [provider]  cyanocobalamin 1000 MCG tablet Take by mouth daily. 06/25/19   [provider]  diphenoxylate-atropine (LOMOTIL) 2.5-0.025 MG tablet Take 1 tablet by mouth 4 (four) times daily as needed for diarrhea or loose stools. 09/14/20   Emeterio Reeve, DO  famotidine (PEPCID) 40 MG tablet Take 1 tablet (40 mg total) by mouth daily. 11/11/19   Emeterio Reeve, DO  furosemide (LASIX) 20 MG tablet TAKE 1 TABLET BY MOUTH TWICE A DAY WITH BREAKFAST AND WITH LUNCH 07/13/21   Emeterio Reeve, DO  gabapentin (NEURONTIN) 300 MG capsule Take 3 capsules (900 mg total) by mouth at bedtime. 02/11/21   Emeterio Reeve, DO  ipratropium-albuterol (DUONEB) 0.5-2.5 (3) MG/3ML SOLN Inhale into the lungs every 4 (four) hours as needed. 05/24/21   [provider]  lisinopril (ZESTRIL) 2.5 MG tablet Take 1 tablet (2.5 mg total) by mouth daily. 02/11/21   Emeterio Reeve, DO  LORazepam (ATIVAN) 0.5 MG tablet TAKE ONE-HALF (1/2) TO ONE TABLET AT BEDTIME AS NEEDED FOR ANXIETY OR SLEEP 05/14/21   Emeterio Reeve, DO  Magnesium 400 MG CAPS Take by mouth daily.    [provider]  Melatonin 10 MG SUBL Place 10 mg under the tongue at bedtime. 04/03/18   Emeterio Reeve, DO  Multiple Vitamins-Minerals (PRESERVISION AREDS) CAPS Take by mouth daily.    [provider]  nitroGLYCERIN (NITROSTAT) 0.4 MG SL tablet Place 1 tablet (0.4 mg total) under the tongue every 5 (five) minutes as needed for chest pain. If no better 10-15 mins, go to hospital 03/27/18   Emeterio Reeve, DO  nystatin (MYCOSTATIN/NYSTOP) powder Apply topically 4 (four) times daily. 12/24/19   Emeterio Reeve, DO  ondansetron (ZOFRAN-ODT) 8 MG disintegrating tablet Take 1 tablet (8 mg total) by mouth every 8 (eight) hours as needed for nausea. 09/14/20   Emeterio Reeve, DO  oxyCODONE-acetaminophen (PERCOCET) 10-325 MG tablet Take 1  tablet by mouth every 8 (eight) hours as needed for pain. 06/17/21   Emeterio Reeve, DO  psyllium (REGULOID) 0.52 g capsule Take 0.52 g by mouth daily.    [provider]  rOPINIRole (REQUIP) 3 MG tablet Take 1 tablet (3 mg total) by mouth 3 (three) times daily. 02/11/21   Emeterio Reeve, DO  sodium chloride 1 g tablet Take 1 g by mouth 3 (three) times daily. 05/28/21   [provider]  umeclidinium-vilanterol (ANORO ELLIPTA) 62.5-25 MCG/INH AEPB Inhale 1 puff into the lungs daily. Patient not taking: Reported on 07/21/2021 06/07/18   Emeterio Reeve, DO    Allergies    Alendronate sodium, Benztropine, Citalopram, Duloxetine hcl, Erythromycin, Fluoxetine hcl, Glimepiride, Nortriptyline hcl, Latex, Penicillins, and Quetiapine fumarate  Review of Systems   Review of Systems  Unable to perform ROS: Dementia   Physical Exam Updated Vital Signs BP (!) 160/78   Pulse 65   Temp 98.3 F (36.8 C) (Oral)   Resp 18   SpO2 99%   Physical Exam Constitutional:      General: She is not in acute distress.    Appearance: Normal appearance.  HENT:     Head: Normocephalic.     Nose: Nose normal.  Eyes:     Extraocular Movements: Extraocular movements intact.  Cardiovascular:     Rate and Rhythm: Normal rate.  Pulmonary:     Effort: Pulmonary effort is normal.  Musculoskeletal:        General: Normal range of motion.     Cervical back: Normal range of motion.     Comments: No pain on CT or L-spine midline with palpations.  No step-off noted.  No significant pain or discomfort noted with range of motion of the bilateral hips or knees or ankles.  Neurological:     General: No focal deficit present.     Mental Status: She is alert. Mental status is at baseline.    ED Results / Procedures / Treatments   Labs (all labs ordered are listed, but only abnormal results are displayed) Labs Reviewed - No data to display  EKG None  Radiology DG HIP UNILAT WITH PELVIS 2-3  VIEWS LEFT  Result Date: 07/23/2021 CLINICAL DATA:  Fall out of wheelchair yesterday, left hip and tailbone pain. EXAM: DG HIP (WITH OR WITHOUT PELVIS) 2-3V LEFT COMPARISON:  Left hip radiograph 05/19/2020 FINDINGS: The bones are under mineralized which limits detailed assessment. No evidence of acute fracture. Irregularity adjacent to the left greater trochanter appears chronic  and is likely related to enthesopathic change. The femoral head is well seated. No displaced pubic ramus fracture. Pubic symphysis and sacroiliac joints are congruent. Visualized sacral ala are grossly intact. IMPRESSION: Osteopenia/osteoporosis without acute fracture. Electronically Signed   By: Keith Rake M.D.   On: 07/23/2021 20:21    Procedures Procedures   Medications Ordered in ED Medications  oxyCODONE-acetaminophen (PERCOCET/ROXICET) 5-325 MG per tablet 1 tablet (1 tablet Oral Given 07/23/21 1918)    ED Course  I have reviewed the triage vital signs and the nursing notes.  Pertinent labs & imaging results that were available during my care of the patient were reviewed by me and considered in my medical decision making (see chart for details).    MDM Rules/Calculators/A&P                           Clinically low suspicion of acute fracture.  X-ray confirms no evidence of acute fracture.  Patient discharged home in stable condition.  She has a history of chronic pain takes oxycodone and she was provided 1 here in the ER.  Advise follow-up with her doctor within the week.  Advised immediate return for worsening pain or any additional concerns.  Final Clinical Impression(s) / ED Diagnoses Final diagnoses:  Left hip pain  Musculoskeletal pain    Rx / DC Orders ED Discharge Orders     None        Luna Fuse, MD 07/23/21 2041

## 2021-07-23 NOTE — ED Triage Notes (Signed)
Pt arrives POV, with her daughter.  States pt fell out of her wheelchair yesterday and is now having tailbone and left hip pain.

## 2021-07-23 NOTE — Discharge Instructions (Addendum)
There was no sign of a fracture on your x-rays.  Take your pain medications as needed at home.  Follow-up with your doctor within the week.  Return if you have worsening pain fevers or any additional concerns.

## 2021-07-25 LAB — COMPLETE METABOLIC PANEL WITH GFR
AG Ratio: 1.4 (calc) (ref 1.0–2.5)
ALT: 15 U/L (ref 6–29)
AST: 24 U/L (ref 10–35)
Albumin: 4 g/dL (ref 3.6–5.1)
Alkaline phosphatase (APISO): 72 U/L (ref 37–153)
BUN/Creatinine Ratio: 18 (calc) (ref 6–22)
BUN: 21 mg/dL (ref 7–25)
CO2: 27 mmol/L (ref 20–32)
Calcium: 8.8 mg/dL (ref 8.6–10.4)
Chloride: 88 mmol/L — ABNORMAL LOW (ref 98–110)
Creat: 1.15 mg/dL — ABNORMAL HIGH (ref 0.60–0.95)
Globulin: 2.8 g/dL (calc) (ref 1.9–3.7)
Glucose, Bld: 123 mg/dL — ABNORMAL HIGH (ref 65–99)
Potassium: 4.1 mmol/L (ref 3.5–5.3)
Sodium: 123 mmol/L — ABNORMAL LOW (ref 135–146)
Total Bilirubin: 0.3 mg/dL (ref 0.2–1.2)
Total Protein: 6.8 g/dL (ref 6.1–8.1)
eGFR: 45 mL/min/{1.73_m2} — ABNORMAL LOW (ref >=60)

## 2021-07-25 LAB — QUANTIFERON-TB GOLD PLUS
Mitogen-NIL: 4.23 IU/mL
NIL: 0.02 IU/mL
QuantiFERON-TB Gold Plus: NEGATIVE
TB1-NIL: 0.01 IU/mL
TB2-NIL: 0 IU/mL

## 2021-07-26 ENCOUNTER — Telehealth: Payer: Self-pay | Admitting: General Practice

## 2021-07-26 NOTE — Telephone Encounter (Signed)
Transition Care Management Unsuccessful Follow-up Telephone Call  Date of discharge and from where:  07/23/21 from [facility]  Attempts:  1st Attempt  Reason for unsuccessful TCM follow-up call:  No answer/busy

## 2021-07-28 NOTE — Telephone Encounter (Signed)
Transition Care Management Unsuccessful Follow-up Telephone Call  Date of discharge and from where:  07/23/21 from [facility]  Attempts:  2nd Attempt  Reason for unsuccessful TCM follow-up call:  No answer/busy

## 2021-07-30 NOTE — Telephone Encounter (Signed)
Transition Care Management Unsuccessful Follow-up Telephone Call  Date of discharge and from where:  07/23/21 from [facility]  Attempts:  2nd Attempt  Reason for unsuccessful TCM follow-up call:  No answer/busy

## 2021-08-05 ENCOUNTER — Telehealth: Payer: Self-pay

## 2021-08-05 NOTE — Telephone Encounter (Signed)
Transition Care Management Follow-up Telephone Call Date of discharge and from where: 08/02/21 from [facility] How have you been since you were released from the hospital? Per daughter patient has been transferred to SNF/Memory care unit.      Thea Silversmith, RN, MSN, BSN, [place] Care Management Coordinator [phone]

## 2021-08-07 ENCOUNTER — Other Ambulatory Visit: Payer: Self-pay | Admitting: Osteopathic Medicine

## 2021-08-23 ENCOUNTER — Other Ambulatory Visit: Payer: Self-pay | Admitting: Family Medicine

## 2021-09-27 ENCOUNTER — Telehealth: Payer: Self-pay | Admitting: General Practice

## 2021-09-27 NOTE — Telephone Encounter (Signed)
Transition Care Management Unsuccessful Follow-up Telephone Call  Date of discharge and from where:  09/23/21 from [facility]  Attempts:  1st Attempt  Reason for unsuccessful TCM follow-up call:  Voice mail full

## 2021-09-29 NOTE — Telephone Encounter (Signed)
Transition Care Management Follow-up Telephone Call Date of discharge and from where: 09/27/2021 from [facility] How have you been since you were released from the hospital? Spoke to [place], pt dtr, and she stated that pt has not been feeling very well. Gae Bon stated that patient resides in [place].  Any questions or concerns? No  Follow up appointments reviewed:  PCP Hospital f/u appt confirmed? No   Specialist Hospital f/u appt confirmed? Yes  Scheduled to see GI on 10/01/2021. Are transportation arrangements needed? No  If their condition worsens, is the pt aware to call PCP or go to the Emergency Dept.? Yes Was the patient provided with contact information for the PCP's office or ED? Yes Was to pt encouraged to call back with questions or concerns? Yes

## 2021-10-05 ENCOUNTER — Telehealth: Payer: Self-pay | Admitting: General Practice

## 2021-10-05 NOTE — Telephone Encounter (Signed)
Transition Care Management Unsuccessful Follow-up Telephone Call  Date of discharge and from where:  10/04/21 from [facility]  Attempts:  1st Attempt  Reason for unsuccessful TCM follow-up call:  No answer/busy

## 2021-10-06 NOTE — Telephone Encounter (Signed)
Transition Care Management Unsuccessful Follow-up Telephone Call  Date of discharge and from where:  10/04/21 from [facility]  Attempts:  2nd Attempt  Reason for unsuccessful TCM follow-up call:  No answer/busy

## 2021-10-08 NOTE — Telephone Encounter (Signed)
Transition Care Management Unsuccessful Follow-up Telephone Call  Date of discharge and from where:  10/04/21 from [facility]  Attempts:  3rd Attempt  Reason for unsuccessful TCM follow-up call:  No answer/busy

## 2021-10-20 ENCOUNTER — Ambulatory Visit: Payer: Medicare Other | Admitting: Osteopathic Medicine

## 2022-03-03 ENCOUNTER — Other Ambulatory Visit: Payer: Self-pay | Admitting: Osteopathic Medicine

## 2023-02-14 ENCOUNTER — Encounter: Payer: Self-pay | Admitting: *Deleted
